# Patient Record
Sex: Female | Born: 1985 | Hispanic: Yes | Marital: Single | Smoking: Never smoker
Health system: Southern US, Community
[De-identification: ages and names within clinical notes are randomized; demographics above are authoritative.]

## PROBLEM LIST (undated history)

## (undated) ENCOUNTER — Inpatient Hospital Stay (HOSPITAL_COMMUNITY): Payer: Self-pay

## (undated) DIAGNOSIS — D649 Anemia, unspecified: Secondary | ICD-10-CM

## (undated) DIAGNOSIS — O4690 Antepartum hemorrhage, unspecified, unspecified trimester: Secondary | ICD-10-CM

## (undated) HISTORY — PX: NO PAST SURGERIES: SHX2092

---

## 2012-06-01 NOTE — L&D Delivery Note (Signed)
Delivery Note At 1:58 PM a viable female was delivered via Vaginal, Spontaneous Delivery (Presentation: Left Occiput Anterior).  APGAR: crying vigorously on perineum, Pending final APGARS Placenta status: Intact, Spontaneous.  Cord: Unknown with the following complications: None.    Anesthesia: None  Episiotomy: None Lacerations: None Suture Repair: NA Est. Blood Loss (mL): 250  Mom to postpartum.  Baby to nursery-stable.  Jolyn Lent RYAN 02/02/2013, 2:08 PM

## 2012-06-23 LAB — OB RESULTS CONSOLE GC/CHLAMYDIA: Chlamydia: NEGATIVE

## 2012-06-23 LAB — OB RESULTS CONSOLE ABO/RH: RH Type: POSITIVE

## 2012-06-23 LAB — OB RESULTS CONSOLE HIV ANTIBODY (ROUTINE TESTING): HIV: NONREACTIVE

## 2012-12-29 ENCOUNTER — Encounter (HOSPITAL_COMMUNITY): Payer: Self-pay

## 2012-12-29 ENCOUNTER — Inpatient Hospital Stay (HOSPITAL_COMMUNITY)
Admission: AD | Admit: 2012-12-29 | Discharge: 2012-12-29 | Disposition: A | Discharge status: Home / Self Care | Payer: Medicaid - Out of State | Source: Ambulatory Visit | Attending: Obstetrics & Gynecology | Admitting: Obstetrics & Gynecology

## 2012-12-29 DIAGNOSIS — O4700 False labor before 37 completed weeks of gestation, unspecified trimester: Secondary | ICD-10-CM | POA: Insufficient documentation

## 2012-12-29 DIAGNOSIS — O2390 Unspecified genitourinary tract infection in pregnancy, unspecified trimester: Secondary | ICD-10-CM

## 2012-12-29 DIAGNOSIS — B3731 Acute candidiasis of vulva and vagina: Secondary | ICD-10-CM | POA: Insufficient documentation

## 2012-12-29 DIAGNOSIS — N949 Unspecified condition associated with female genital organs and menstrual cycle: Secondary | ICD-10-CM | POA: Insufficient documentation

## 2012-12-29 DIAGNOSIS — O0933 Supervision of pregnancy with insufficient antenatal care, third trimester: Secondary | ICD-10-CM

## 2012-12-29 DIAGNOSIS — B373 Candidiasis of vulva and vagina: Secondary | ICD-10-CM | POA: Insufficient documentation

## 2012-12-29 LAB — URINALYSIS, ROUTINE W REFLEX MICROSCOPIC
Bilirubin Urine: NEGATIVE
Glucose, UA: NEGATIVE mg/dL
Hgb urine dipstick: NEGATIVE
Ketones, ur: NEGATIVE mg/dL
Protein, ur: NEGATIVE mg/dL
pH: 7 (ref 5.0–8.0)

## 2012-12-29 LAB — WET PREP, GENITAL
Clue Cells Wet Prep HPF POC: NONE SEEN
Trich, Wet Prep: NONE SEEN

## 2012-12-29 LAB — URINE MICROSCOPIC-ADD ON

## 2012-12-29 MED ORDER — FLUCONAZOLE 150 MG PO TABS
150.0000 mg | ORAL_TABLET | Freq: Once | ORAL | Status: AC
  Administered 2012-12-29: 150 mg via ORAL
  Filled 2012-12-29: qty 1

## 2012-12-29 NOTE — MAU Note (Signed)
Patient is visiting from [place] and gets prenatal care there. States she has been having contractions and a yellow vaginal discharge with vaginal discomfort for 3 days. Denies bleeding and reports good fetal movement.

## 2012-12-29 NOTE — MAU Provider Note (Signed)
History     CSN: [id number]  Arrival date and time: 12/29/12 1312   None     Chief Complaint  Patient presents with  . Labor Eval  . Vaginal Discharge   HPI 27 y.o. [phone] at [redacted]w[redacted]d presents for contractions. Contractions started 3 days ago, have increased in frequency and says since last night they have been every 3 minutes. Denies gush of fluid, bleeding, does have yellow vaginal discharge and hurts when she pees. +FM in last hour. Denies headache, dizziness, changes in vision, chest pain, shortness of breath  Prenatal course: Care in [place], phone # [phone] Wants to establish care here in [place] Last prenatal visit June 30th Says there have been no problems so far  OB History   Grav Para Term Preterm Abortions TAB SAB Ect Mult Living   4 3 3       3     Per patient, 2 pregnancies were preterm, born at 77 months  Past Medical History  Diagnosis Date  . Medical history non-contributory     Past Surgical History  Procedure Laterality Date  . No past surgeries      Family History  Problem Relation Age of Onset  . Alcohol abuse Neg Hx   . Asthma Neg Hx   . Arthritis Neg Hx   . Birth defects Neg Hx   . Cancer Neg Hx   . COPD Neg Hx   . Depression Neg Hx   . Diabetes Neg Hx   . Drug abuse Neg Hx   . Early death Neg Hx   . Hearing loss Neg Hx   . Heart disease Neg Hx   . Hyperlipidemia Neg Hx   . Hypertension Neg Hx   . Kidney disease Neg Hx   . Learning disabilities Neg Hx   . Mental illness Neg Hx   . Mental retardation Neg Hx   . Miscarriages / Stillbirths Neg Hx   . Stroke Neg Hx   . Vision loss Neg Hx     History  Substance Use Topics  . Smoking status: Never Smoker   . Smokeless tobacco: Not on file  . Alcohol Use: No    Allergies: No Known Allergies  Prescriptions prior to admission  Medication Sig Dispense Refill  . Prenatal Vit-Fe Fumarate-FA (PRENATAL MULTIVITAMIN) TABS Take 1 tablet by mouth daily.      Marland Kitchen PRESCRIPTION  MEDICATION Take 1 capsule by mouth daily. Pt reports taking a prescription Iron supplement; unknown name/strength        ROS negative except as above Physical Exam   Blood pressure 113/68, pulse 87, temperature 98.3 F (36.8 C), temperature source Oral, resp. rate 16, height 4\' 10"  (1.473 m), weight 82.192 kg (181 lb 3.2 oz).  Physical Exam General appearance: alert, cooperative and no distress Head: Normocephalic, without obvious abnormality, atraumatic Lungs: clear to auscultation bilaterally Heart: regular rate and rhythm, S1, S2 normal, no murmur, click, rub or gallop Abdomen: soft, gravid, nontender to palpation Extremities: no edema, redness or tenderness in the calves or thighs Pulses: 2+ and symmetric DP and PT Skin: warm and dry  SSE: NAVM, Thin yellowish white mucous. Cervix appears closed with nothing draining  Dilation: Fingertip Effacement (%): Thick Station: Ballotable Exam by:: DR Danie Binder and cervical exam performed with nurse present  FHT: 135bpm, mod var, 15x15 accels present, no decels Toco: rare contractions, q53min  MAU Course  Procedures Results for orders placed during the hospital encounter of 12/29/12 (from the past  24 hour(s))  URINALYSIS, ROUTINE W REFLEX MICROSCOPIC     Status: Abnormal   Collection Time    12/29/12  1:45 PM      Result Value Range   Color, Urine YELLOW  YELLOW   APPearance HAZY (*) CLEAR   Specific Gravity, Urine 1.020  1.005 - 1.030   pH 7.0  5.0 - 8.0   Glucose, UA NEGATIVE  NEGATIVE mg/dL   Hgb urine dipstick NEGATIVE  NEGATIVE   Bilirubin Urine NEGATIVE  NEGATIVE   Ketones, ur NEGATIVE  NEGATIVE mg/dL   Protein, ur NEGATIVE  NEGATIVE mg/dL   Urobilinogen, UA 1.0  0.0 - 1.0 mg/dL   Nitrite NEGATIVE  NEGATIVE   Leukocytes, UA LARGE (*) NEGATIVE  URINE MICROSCOPIC-ADD ON     Status: Abnormal   Collection Time    12/29/12  1:45 PM      Result Value Range   Squamous Epithelial / LPF FEW (*) RARE   WBC, UA 3-6  <3  WBC/hpf   Bacteria, UA FEW (*) RARE  FETAL FIBRONECTIN     Status: None   Collection Time    12/29/12  4:00 PM      Result Value Range   Fetal Fibronectin NEGATIVE  NEGATIVE  WET PREP, GENITAL     Status: Abnormal   Collection Time    12/29/12  4:00 PM      Result Value Range   Yeast Wet Prep HPF POC MODERATE (*) NONE SEEN   Trich, Wet Prep NONE SEEN  NONE SEEN   Clue Cells Wet Prep HPF POC NONE SEEN  NONE SEEN   WBC, Wet Prep HPF POC MANY (*) NONE SEEN    MDM   Assessment and Plan  27 y.o. [id number] at [redacted]w[redacted]d   FFN negative, rare contractions, cervix fingertip FHT reassuring Yeast on wet prep, will give 150mg  diflucan in MAU Message sent to [facility] to establish care in [place] ROI form signed by patient, to be faxed tomorrow   Tawni Carnes 12/29/2012, 3:23 PM  Evaluation and management procedures were performed by Resident physician under my supervision/collaboration. Chart reviewed, patient examined by me and I agree with management and plan.

## 2012-12-30 LAB — URINE CULTURE: Colony Count: 70000

## 2012-12-30 LAB — GC/CHLAMYDIA PROBE AMP: CT Probe RNA: NEGATIVE

## 2013-01-01 NOTE — MAU Provider Note (Signed)
Attestation of Attending Supervision of Advanced Practitioner (PA/CNM/NP): Evaluation and management procedures were performed by the Advanced Practitioner under my supervision and collaboration.  I have reviewed the Advanced Practitioner's note and chart, and I agree with the management and plan.  Marquice Uddin, MD, FACOG Attending Obstetrician & Gynecologist Faculty Practice, [facility]  

## 2013-01-05 ENCOUNTER — Ambulatory Visit (INDEPENDENT_AMBULATORY_CARE_PROVIDER_SITE_OTHER): Payer: Self-pay | Admitting: Family Medicine

## 2013-01-05 ENCOUNTER — Encounter: Payer: Self-pay | Admitting: Family Medicine

## 2013-01-05 VITALS — BP 113/69 | Temp 97.2°F | Wt 181.5 lb

## 2013-01-05 DIAGNOSIS — Z23 Encounter for immunization: Secondary | ICD-10-CM

## 2013-01-05 DIAGNOSIS — O09219 Supervision of pregnancy with history of pre-term labor, unspecified trimester: Secondary | ICD-10-CM

## 2013-01-05 DIAGNOSIS — Z3480 Encounter for supervision of other normal pregnancy, unspecified trimester: Secondary | ICD-10-CM | POA: Insufficient documentation

## 2013-01-05 DIAGNOSIS — Z3483 Encounter for supervision of other normal pregnancy, third trimester: Secondary | ICD-10-CM

## 2013-01-05 LAB — CBC
HCT: 34.8 % — ABNORMAL LOW (ref 36.0–46.0)
MCHC: 33.3 g/dL (ref 30.0–36.0)
MCV: 82.7 fL (ref 78.0–100.0)
Platelets: 202 10*3/uL (ref 150–400)
RDW: 14.3 % (ref 11.5–15.5)

## 2013-01-05 LAB — POCT URINALYSIS DIP (DEVICE)
Glucose, UA: NEGATIVE mg/dL
Hgb urine dipstick: NEGATIVE
Nitrite: NEGATIVE
Urobilinogen, UA: 1 mg/dL (ref 0.0–1.0)
pH: 6.5 (ref 5.0–8.0)

## 2013-01-05 MED ORDER — TETANUS-DIPHTH-ACELL PERTUSSIS 5-2.5-18.5 LF-MCG/0.5 IM SUSP
0.5000 mL | Freq: Once | INTRAMUSCULAR | Status: AC
  Administered 2013-01-05: 0.5 mL via INTRAMUSCULAR

## 2013-01-05 NOTE — Progress Notes (Signed)
No PTL symptoms except  3 contractions/day--PTL precations.  New OB transfer from [place].  Last visit there 6/30.  28 wk labs and TDaP today.  Will get records.

## 2013-01-05 NOTE — Progress Notes (Signed)
Pulse- 81  Edema-feet  Pain- lower abd New ob packet given Weight gain of 11-20lb Tdap vaccine consented and info given

## 2013-01-05 NOTE — Patient Instructions (Signed)
Embarazo  Tercer trimestre  (Pregnancy - Third Trimester) El tercer trimestre del embarazo (los ltimos 3 meses) es el perodo en el cual tanto usted como su beb crecen con ms rapidez. El beb alcanza un largo de aproximadamente 50 cm. y pesa entre 2,700 y 4,500 kg. El beb gana ms tejido graso y est listo para la vida fuera del cuerpo de la madre. Mientras estn en el interior, los bebs tienen perodos de sueo y vigilia, succionan el pulgar y tienen hipo. Quizs sienta pequeas contracciones del tero. Este es el falso trabajo de parto. Tambin se las conoce como contracciones de Braxton-Hicks . Es como una prctica del parto. Los problemas ms habituales de esta etapa del embarazo incluyen mayor dificultad para respirar, hinchazn de las manos y los pies por retencin de lquidos y la necesidad de orinar con ms frecuencia debido a que el tero y el beb presionan sobre la vejiga.  EXAMENES PRENATALES   Durante los exmenes prenatales, deber seguir realizndose anlisis de sangre. Estas pruebas se realizan para controlar su salud y la del beb. Los anlisis de sangre se realizan para conocer los niveles de algunos compuestos de la sangre (hemoglobina). La anemia (bajo nivel de hemoglobina) es frecuente durante el embarazo. Para prevenirla, se administran hierro y vitaminas. Tambin le tomarn nuevas anlisis para descartar diabetes. Podrn repetirle algunas de las pruebas que le hicieron previamente.  En cada visita le medirn el tamao del tero. Esto permite asegurar que el beb se desarrolla adecuadamente, segn la fecha del embarazo.  Le controlarn la presin arterial en cada visita prenatal. Esto es para asegurarse de que no sufre toxemia.  Le harn un anlisis de orina en cada visita prenatal, para descartar infecciones, diabetes y la presencia de protenas.  Tambin en cada visita controlarn su peso. Esto se realiza para asegurarse que aumenta de peso al ritmo indicado y que usted y  su beb evolucionan normalmente.  En algunas ocasiones se realiza una prueba de ultrasonido para confirmar el correcto desarrollo y evolucin del beb. Esta prueba se realiza con ondas sonoras inofensivas para el beb, de modo que el profesional pueda calcular ms precisamente la fecha del parto.  Analice con su mdico los analgsicos y la anestesia que recibir durante el trabajo de parto y el parto.  Comente la posibilidad de que necesite una cesrea y qu anestesia se recibir.  Informe a su mdico si sufre violencia familiar mental o fsica. A veces, se indica la prueba especializada sin estrs, la prueba de tolerancia a las contracciones y el perfil biofsico para asegurarse de que el beb no tiene problemas. El estudio del lquido amnitico que rodea al beb se llama amniocentesis. El lquido amnitico se obtiene introduciendo una aguja en el vientre (abdomen ). En ocasiones se lleva a cabo cerca del final del embarazo, si es necesario inducir a un parto. En este caso se realiza para asegurarse que los pulmones del beb estn lo suficientemente maduros como para que pueda vivir fuera del tero. Si los pulmones no han madurado y es peligroso que el beb nazca, se administrar a la madre una inyeccin de cortisona , 1 a 2 das antes del parto. . Esto ayuda a que los pulmones del beb maduren y sea ms seguro su nacimiento.  CAMBIOS QUE OCURREN EN EL TERCER TRIMESTRE DEL EMBARAZO  Su organismo atravesar numerosos cambios durante el embarazo. Estos pueden variar de una persona a otra. Converse con el profesional que la asiste acerca los cambios que   usted note y que la preocupen.   Durante el ltimo trimestre probablemente sienta un aumento del apetito. Es normal tener "antojos" de ciertas comidas. Esto vara de una persona a otra y de un embarazo a otro.  Podrn aparecer las primeras estras en las caderas, abdomen y mamas. Estos son cambios normales del cuerpo durante el embarazo. No existen  medicamentos ni ejercicios que puedan prevenir estos cambios.  La constipacin puede tratarse con un laxante o agregando fibra a su dieta. Beber grandes cantidades de lquidos, tomar fibras en forma de vegetales, frutas y granos integrales es de gran ayuda.  Tambin es beneficioso practicar actividad fsica. Si ha sido una persona activa hasta el embarazo, podr continuar con la mayora de las actividades durante el mismo. Si ha sido menos activa, puede ser beneficioso que comience con un programa de ejercicios, como realizar caminatas. Consulte con el profesional que la asiste antes de comenzar un programa de ejercicios.  Evite el consumo de cigarrillos, el alcohol, los medicamentos no recetados y las "drogas de la calle" durante el embarazo. Estas sustancias qumicas afectan la formacin y el desarrollo del beb. Evite estas sustancias durante todo el embarazo para asegurar el nacimiento de un beb sano.  Podr sentir dolor de espalda, tener vrices en las venas y hemorroides, o si ya los sufra, pueden empeorar.  Durante el tercer trimestre se cansar con ms facilidad, lo cual es normal.  Los movimientos del beb pueden ser ms fuertes y con ms frecuencia.  Puede que note dificultades para respirar normalmente.  El ombligo puede salir hacia afuera.  A veces sale una secrecin amarilla de las mamas, que se llama calostro.  Podr aparecer una secrecin mucosa con sangre. Esto suele ocurrir entre unos pocos das y una semana antes del parto. INSTRUCCIONES PARA EL CUIDADO EN EL HOGAR   Cumpla con las citas de control. Siga las indicaciones del mdico con respecto al uso de medicamentos, los ejercicios y la dieta.  Durante el embarazo debe obtener nutrientes para usted y para su beb. Consuma alimentos balanceados a intervalos regulares. Elija alimentos como carne, pescado, leche y otros productos lcteos descremados, vegetales, frutas, panes integrales y cereales. El mdico le informar  cul es el aumento de peso ideal.  Las relaciones sexuales pueden continuarse hasta casi el final del embarazo, si no se presentan otros problemas como prdida prematura (antes de tiempo) de lquido amnitico, hemorragia vaginal o dolor en el vientre (abdominal).  Realice actividad fsica todos los das, si no tiene restricciones. Consulte con el profesional que la asiste si no sabe con certeza si determinados ejercicios son seguros. El mayor aumento de peso se producir en los ltimos 2 trimestres del embarazo. El ejercicio ayuda a:  Controlar su peso.  Mantenerse en forma para el trabajo de parto y el parto .  Perder peso despus del parto.  Haga reposo con frecuencia, con las piernas elevadas, o segn lo necesite para evitar los calambres y el dolor de cintura.  Use un buen sostn o como los que se usan para hacer deportes para aliviar la sensibilidad de las mamas. Tambin puede serle til si lo usa mientras duerme. Si pierde calostro, podr utilizar apsitos en el sostn.  No utilice la baera con agua caliente, baos turcos y saunas.   Colquese el cinturn de seguridad cuando conduzca. Este la proteger a usted y al beb en caso de accidente.  Evite comer carne cruda y el contacto con los utensilios y desperdicios de los gatos. Estos   elementos contienen grmenes que pueden causar defectos de nacimiento en el beb.  Es fcil perder algo de orina durante el embarazo. Apretar y fortalecer los msculos de la pelvis la ayudar con este problema. Practique detener la miccin cuando est en el bao. Estos son los mismos msculos que necesita fortalecer. Son tambin los mismos msculos que utiliza cuando trata de evitar despedir gases. Puede practicar apretando estos msculos diez veces, y repetir esto tres veces por da aproximadamente. Una vez que conozca qu msculos debe apretar, no realice estos ejercicios durante la miccin. Puede favorecerle una infeccin si la orina vuelve hacia  atrs.  Pida ayuda si tienen necesidades financieras, teraputicas o nutricionales. El profesional podr ayudarla con respecto a estas necesidades, o derivarla a otros especialistas.  Haga una lista de nmeros telefnicos de emergencia y tngalos disponibles.  Planifique como obtener ayuda de familiares o amigos cuando regrese a casa desde el hospital.  Hacer un ensayo sobre la partida al hospital.  Tome clases prenatales con el padre para entender, practicar y hacer preguntas sobre el trabajo de parto y el alumbramiento.  Preparar la habitacin del beb / busque una guardera.  No viaje fuera de la ciudad a menos que sea absolutamente necesario y con el asesoramiento de su mdico.  Use slo zapatos de tacn bajo o sin tacn para tener mejor equilibrio y evitar cadas. USO DE MEDICAMENTOS Y CONSUMO DE DROGAS DURANTE EL EMBARAZO   Tome las vitaminas apropiadas para esta etapa tal como se le indic. Las vitaminas deben contener un miligramo de cido flico. Guarde todas las vitaminas fuera del alcance de los nios. La ingestin de slo un par de vitaminas o tabletas que contengan hierro pueden ocasionar la muerte en un beb o en un nio pequeo.  Evite el uso de todos los medicamentos, incluyendo hierbas, medicamentos de venta libre, sin receta o que no hayan sido sugeridos por su mdico. Slo tome medicamentos de venta libre o medicamentos recetados para el dolor, el malestar o fiebre como lo indique su mdico. No tome aspirina, ibuprofeno o naproxeno excepto que su mdico se lo indique.  Infrmele al profesional si consume alguna droga.  El alcohol se relaciona con ciertos defectos congnitos. Incluye el sndrome de alcoholismo fetal. Debe evitar absolutamente el consumo de alcohol, en cualquier forma. El fumar produce baja tasa de natalidad y bebs prematuros.  Las drogas ilegales o de la calle son muy perjudiciales para el beb. Estn absolutamente prohibidas. Un beb que nace de una  madre adicta, ser adicto al nacer. Ese beb tendr los mismos sntomas de abstinencia que un adulto. SOLICITE ATENCIN MDICA SI:  Tiene preguntas o preocupaciones relacionadas con el embarazo. Es mejor que llame para formular las preguntas si no puede esperar hasta la prxima visita, que sentirse preocupada por ellas.  SOLICITE ATENCIN MDICA DE INMEDIATO SI:   La temperatura oral le sube a ms de 38,9 C (102 F) o lo que su mdico le indique.  Tiene una prdida de lquido por la vagina (canal de parto). Si sospecha una ruptura de las membranas, tmese la temperatura y llame al profesional para informarlo sobre esto.  Observa unas pequeas manchas, una hemorragia vaginal o elimina cogulos. Notifique al profesional acerca de la cantidad y de cuntos apsitos est utilizando.  Presenta un olor desagradable en la secrecin vaginal y observa un cambio en el color, de transparente a blanco.  Ha vomitado durante ms de 24 horas.  Siente escalofros o le sube la fiebre.  Le   falta el Jazzlene.  Siente ardor al orinar.  Baja o sube ms de 2 libras (900 g), o segn lo indicado por el profesional que la asiste.  Observa que sbitamente se le hinchan el rostro, las manos, los pies o las piernas.  Siente dolor en el vientre (abdominal). Las molestias en el ligamento redondo son una causa benigna frecuente de dolor abdominal durante el embarazo. El profesional que la asiste deber evaluarla.  Presenta dolor de cabeza intenso que no se alivia.  Tiene problemas visuales, visin doble o borrosa.  Si no siente los movimientos del beb durante ms de 1 hora. Si piensa que el beb no se mueve tanto como lo haca habitualmente, coma algo que contenga azcar y recustese sobre el lado izquierdo durante una hora. El beb debe moverse al menos 4  5 veces por hora. Comunquese inmediatamente si el beb se mueve menos que lo indicado.  Se cae, se ve involucrada en un accidente automovilstico o sufre algn  tipo de traumatismo.  En su hogar hay violencia mental o fsica. Document Released: 02/25/2005 Document Revised: 02/10/2012 [employer] Patient Information 2014 [employer].  Lactancia materna  (Breastfeeding)  El cambio hormonal durante el embarazo produce el desarrollo del tejido mamario y un aumento en el nmero y tamao de los conductos galactforos. La hormona prolactina permite que las protenas, los azcares y las grasas de la sangre produzcan la leche materna en las glndulas productoras de leche. La hormona progesterona impide que la leche materna sea liberada antes del nacimiento del beb. Despus del nacimiento del beb, su nivel de progesterona disminuye permitiendo que la leche materna sea liberada. Pensar en el beb, as como la succin o el llanto, pueden estimular la liberacin de leche de las glndulas productoras de leche.  La decisin de amamantar (lactar) es una de las mejores opciones que usted puede hacer para usted y su beb. La informacin que sigue da una breve resea de los beneficios, as como otras caractersticas importantes que debe saber sobre la lactancia materna.  LOS BENEFICIOS DE AMAMANTAR  Para el beb   La primera leche (calostro) ayuda al mejor funcionamiento del sistema digestivo del beb.   La leche tiene anticuerpos que provienen de la madre y que ayudan a prevenir las infecciones en el beb.   El beb tiene una menor incidencia de asma, alergias y del sndrome de muerte sbita del lactante (SMSL).   Los nutrientes de la leche materna son mejores para el beb que la leche maternizada.  La leche materna mejora el desarrollo cerebral del beb.   Su beb tendr menos gases, clicos y estreimiento.  Es menos probable que el beb desarrolle otras enfermedades, como obesidad infantil, asma o diabetes mellitus. Para usted   La lactancia materna favorece el desarrollo de un vnculo muy especial entre la madre y el beb.   Es ms conveniente,  siempre disponible, a la temperatura adecuada y econmica.   La lactancia materna ayuda a quemar caloras y a perder el peso ganado durante el embarazo.   Hace que el tero se contraiga ms rpidamente a su tamao normal y disminuye el sangrado despus del parto.   Las madres que amamantan tienen menos riesgo de desarrollar osteoporosis o cncer de mama o de ovario en el futuro.  FRECUENCIA DEL AMAMANTAMIENTO   Un beb sano, nacido a trmino, puede amamantarse con tanta frecuencia como cada hora, o espaciar las comidas cada tres horas. La frecuencia en la lactancia varan de un beb a   otro.   Los recin nacidos deben ser alimentados por lo menos cada 2-3 horas durante el da y cada 4-5 horas durante la noche. Usted debe amamantarlo un mnimo de 8 tomas en un perodo de 24 horas.  Despierte al beb para amamantarlo si han pasado 3-4 horas desde la ltima comida.  Amamante cuando sienta la necesidad de reducir la plenitud de sus senos o cuando el beb muestre signos de hambre. Las seales de que el beb puede tener hambre son:  Aumenta su estado de alerta o vigilancia.  Se estira.  Mueve la cabeza de un lado a otro.  Mueve la cabeza y abre la boca cuando se le toca la mejilla o la boca (reflejo de succin).  Aumenta las vocalizaciones, tales como sonidos de succin, relamerse los labios, arrullos, suspiros, o chirridos.  Mueve la mano hacia la boca.  Se chupa con ganas los dedos o las manos.  Agitacin.  Llanto intermitente.  Los signos de hambre extrema requerirn que lo calme y lo consuele antes de tratar de alimentarlo. Los signos de hambre extrema son:  Agitacin.  Llanto fuerte e intenso.  Gritos.  El amamantamiento frecuente la ayudar a producir ms leche y a prevenir problemas de dolor en los pezones e hinchazn de las mamas.  LACTANCIA MATERNA   Ya sea que se encuentre acostada o sentada, asegrese que el abdomen del beb est enfrente el suyo.   Sostenga  la mama con el pulgar por arriba y los otros 4 dedos por debajo del pezn. Asegrese que sus dedos se encuentren lejos del pezn y de la boca del beb.   Empuje suavemente los labios del beb con el pezn o con el dedo.   Cuando la boca del beb se abra lo suficiente, introduzca el pezn y la zona oscura que lo rodea (areola) tanto como le sea posible dentro de la boca.  Debe haber ms areola visible por arriba del labio superior que por debajo del labio inferior.  La lengua del beb debe estar entre la enca inferior y el seno.  Asegrese de que la boca del beb est en la posicin correcta alrededor del pezn (prendida). Los labios del beb deben crear un sello sobre su pecho.  Las seales de que el beb se ha prendido eficazmente al pezn son:  Tironea o succiona sin dolor.  Se escucha que traga entre las succiones.  No hace ruidos ni chasquidos.  Hay movimientos musculares por arriba y por delante de sus odos al succionar.  El beb debe succionar unos 2-3 minutos para que salga la leche. Permita que el nio se alimente en cada mama todo lo que desee. Alimente al beb hasta que se desprenda o se quede dormido en el primer pecho y luego ofrzcale el segundo pecho.  Las seales de que el beb est lleno y satisfecho son:  Disminuye gradualmente el nmero de succiones o no succiona.  Se queda dormido.  Extiende o relaja su cuerpo.  Retiene una pequea cantidad de leche en la boca.  Se desprende del pecho por s mismo.  Los signos de una lactancia materna eficaz son:  Los senos han aumentado la firmeza, el peso y el tamao antes de la alimentacin.  Son ms blandos despus de amamantar.  Un aumento del volumen de leche, y tambin el cambio de su consistencia y color se producen hacia el quinto da de lactancia materna.  La congestin mamaria se alivia al dar de mamar.  Los pezones no duelen,   ni estn agrietados ni sangran.  De ser necesario, interrumpa la succin  poniendo su dedo en la esquina de la boca del beb y deslizando el dedo entre sus encas. A continuacin, retire la mama de su boca.  Es comn que los bebs regurgiten un poco despus de comer.  A menudo los bebs tragan Anndee al alimentarse. Esto puede hacer que se sienta molesto. Hacer eructar al beb al cambiar de pecho puede ser de ayuda.  Se recomiendan suplementos de vitamina D para los bebs que reciben slo leche materna.  Evite el uso del chupete durante las primeras 4 a 6 semanas de vida.  Evite la alimentacin suplementaria con agua, frmula o jugo en lugar de la leche materna. La leche materna es todo el alimento que el beb necesita. No es necesario que el nio ingiera agua o preparados de bibern. Sus pechos producirn ms leche si se evita la alimentacin suplementaria durante las primeras semanas. COMO SABER SI EL BEB OBTIENE LA SUFICIENTE LECHE MATERNA  Preguntarse si el beb obtiene la cantidad suficiente de leche es una preocupacin frecuente entre las madres. Puede asegurarse que el beb tiene la leche suficiente si:   El beb succiona activamente y usted escucha que traga.   El beb parece estar relajado y satisfecho despus de mamar.   El nio se alimenta al menos 8 a 12 veces en 24 horas.  Durante los primeros 3 a 5 das de vida:  Moja 3-5 paales en 24 horas. La materia fecal debe ser blanda y amarillenta.  Tiene al menos 3 a 4 deposiciones en 24 horas. La materia fecal debe ser blanda y amarillenta.  A los 5-7 das de vida, el beb debe tener al menos 3-6 deposiciones en 24 horas. La materia fecal debe ser grumosa y amarilla a los 5 das de vida.  Su beb tiene una prdida de peso menor a 7al 10% durante los primeros 3 das de vida.  El beb no pierde peso despus de 3-7 das de vida.  El beb debe aumentar 4 a 6 libras (120 a 170 gr.) por semana despus de los 4 das de vida.  Aumenta de peso a los 5 das de vida y vuelve al peso del nacimiento dentro de  las 2 semanas. CONGESTIN MAMARIA  Durante la primera semana despus del parto, usted puede experimentar hinchazn en las mamas (congestin mamaria). Al estar congestionadas, las mamas se sienten pesadas, calientes o sensibles al tacto. El pico de la congestin ocurre a las 24 -48 horas despus del parto.   La congestin puede disminuirse:  Continuando con la lactancia materna.  Aumentando la frecuencia.  Tomando duchas calientes o aplicando calor hmedo en los senos antes de cada comida. Esto aumenta la circulacin y ayuda a que la leche fluya.   Masajeando suavemente el pecho antes y durante las comidas. Con las yemas de los dedos, masajee la pared del pecho hacia el pezn en un movimiento circular.   Asegurarse de que el beb vaca al menos uno de sus pechos en cada alimentacin. Tambin ayuda si comienza la siguiente toma en el otro seno.   Extraiga manualmente o con un sacaleches las mamas para vaciar los pechos si el beb tiene sueo o no se aliment bien. Tambin puede extraer la leche cuando vuelva a trabajar o si siente que se estn congestionando las mamas.  Asegrese de que el beb se prende y est bien colocado durante la lactancia. Si sigue estas indicaciones, la congestin debe mejorar   en 24 a 48 horas. Si an tiene dificultades, consulte a su asesor en lactancia.  CUDESE USTED MISMA  Cuide sus mamas.   Bese o dchese diariamente.   Evite usar jabn en los pezones.   Use un sostn de soporte Evite el uso de sostenes con aro.  Seque al Shari sus pezones durante 3-4 minutos despus de cada comida.   Utilice slo apsitos de algodn en el sostn para absorber las prdidas de leche. La prdida de un poco de leche materna entre las comidas es normal.   Use solamente lanolina pura en sus pezones despus de amamantar. Usted no tiene que lavarla antes de alimentar al beb. Otra opcin es sacarse unas gotas de leche y masajear suavemente los pezones.  Continuar con  los autocontroles de la mama. Cudese.   Consuma alimentos saludables. Alterne 3 comidas con 3 colaciones.  Evite los alimentos que usted nota que perjudican al beb.  Beba leche, jugos de fruta y agua para satisfacer su sed (aproximadamente 8 vasos al da).   Descanse con frecuencia, reljese y tome sus vitaminas prenatales para evitar la fatiga, el estrs y la anemia.  Evite masticar y fumar tabaco.  Evite el consumo de alcohol y drogas.  Tome medicamentos de venta libre y recetados tal como le indic su mdico o farmacutico. Siempre debe consultar con su mdico o farmacutico antes de tomar cualquier medicamento, vitamina o suplemento de hierbas.  Sepa que durante la lactancia puede quedar embarazada. Si lo desea, hable con su mdico acerca de la planificacin familiar y los mtodos anticonceptivos seguros que puede utilizar durante la lactancia. SOLICITE ATENCIN MDICA SI:   Usted siente que quiere dejar de amamantar o se siente frustrada con la lactancia.  Siente dolor en los senos o en los pezones.  Sus pezones estn agrietados o sangran.  Sus pechos estn irritados, sensibles o calientes.  Tiene un rea hinchada en cualquiera de los senos.  Siente escalofros o fiebre.  Tiene nuseas o vmitos.  Observa un drenaje en los pezones.  Sus mamas no se llenan antes de amamantarlo al 5to da despus del parto.  Se siente triste y deprimida.  El nio est demasiado somnoliento como para comer.  El nio tiene problemas para respirar.   Moja menos de 3 paales en 24 horas.  Mueve el intestino menos de 3 veces en 24 horas.  La piel del beb o la parte blanca de sus ojos est ms amarilla.   El beb no ha aumentado de peso a los 5 das de vida. ASEGRESE DE QUE:   Comprende estas instrucciones.  Controlar su enfermedad.  Solicitar ayuda de inmediato si no mejora o si empeora. Document Released: 05/18/2005 Document Revised: 02/10/2012 [employer] Patient  Information 2014 [employer].  

## 2013-01-06 ENCOUNTER — Encounter: Payer: Self-pay | Admitting: Family Medicine

## 2013-01-06 LAB — RPR

## 2013-01-09 ENCOUNTER — Encounter: Payer: Self-pay | Admitting: *Deleted

## 2013-01-19 ENCOUNTER — Ambulatory Visit (INDEPENDENT_AMBULATORY_CARE_PROVIDER_SITE_OTHER): Payer: Medicaid - Out of State | Admitting: Obstetrics and Gynecology

## 2013-01-19 ENCOUNTER — Encounter: Payer: Self-pay | Admitting: Obstetrics and Gynecology

## 2013-01-19 VITALS — BP 110/64 | Temp 97.1°F | Wt 182.6 lb

## 2013-01-19 DIAGNOSIS — O09213 Supervision of pregnancy with history of pre-term labor, third trimester: Secondary | ICD-10-CM | POA: Insufficient documentation

## 2013-01-19 DIAGNOSIS — Z3689 Encounter for other specified antenatal screening: Secondary | ICD-10-CM

## 2013-01-19 DIAGNOSIS — E669 Obesity, unspecified: Secondary | ICD-10-CM | POA: Insufficient documentation

## 2013-01-19 DIAGNOSIS — O09219 Supervision of pregnancy with history of pre-term labor, unspecified trimester: Secondary | ICD-10-CM

## 2013-01-19 DIAGNOSIS — Z3483 Encounter for supervision of other normal pregnancy, third trimester: Secondary | ICD-10-CM

## 2013-01-19 LAB — POCT URINALYSIS DIP (DEVICE)
Nitrite: NEGATIVE
Protein, ur: NEGATIVE mg/dL
Urobilinogen, UA: 0.2 mg/dL (ref 0.0–1.0)
pH: 7 (ref 5.0–8.0)

## 2013-01-19 MED ORDER — PRENATAL MULTIVITAMIN CH: 1.0000 | ORAL_TABLET | Freq: Every day | ORAL | Status: DC

## 2013-01-19 NOTE — Patient Instructions (Signed)
Embarazo  Tercer trimestre  (Pregnancy - Third Trimester) El tercer trimestre del embarazo (los ltimos 3 meses) es el perodo en el cual tanto usted como su beb crecen con ms rapidez. El beb alcanza un largo de aproximadamente 50 cm. y pesa entre 2,700 y 4,500 kg. El beb gana ms tejido graso y est listo para la vida fuera del cuerpo de la madre. Mientras estn en el interior, los bebs tienen perodos de sueo y vigilia, succionan el pulgar y tienen hipo. Quizs sienta pequeas contracciones del tero. Este es el falso trabajo de parto. Tambin se las conoce como contracciones de Braxton-Hicks . Es como una prctica del parto. Los problemas ms habituales de esta etapa del embarazo incluyen mayor dificultad para respirar, hinchazn de las manos y los pies por retencin de lquidos y la necesidad de orinar con ms frecuencia debido a que el tero y el beb presionan sobre la vejiga.  EXAMENES PRENATALES   Durante los exmenes prenatales, deber seguir realizndose anlisis de sangre. Estas pruebas se realizan para controlar su salud y la del beb. Los anlisis de sangre se realizan para conocer los niveles de algunos compuestos de la sangre (hemoglobina). La anemia (bajo nivel de hemoglobina) es frecuente durante el embarazo. Para prevenirla, se administran hierro y vitaminas. Tambin le tomarn nuevas anlisis para descartar diabetes. Podrn repetirle algunas de las pruebas que le hicieron previamente.  En cada visita le medirn el tamao del tero. Esto permite asegurar que el beb se desarrolla adecuadamente, segn la fecha del embarazo.  Le controlarn la presin arterial en cada visita prenatal. Esto es para asegurarse de que no sufre toxemia.  Le harn un anlisis de orina en cada visita prenatal, para descartar infecciones, diabetes y la presencia de protenas.  Tambin en cada visita controlarn su peso. Esto se realiza para asegurarse que aumenta de peso al ritmo indicado y que usted y su  beb evolucionan normalmente.  En algunas ocasiones se realiza una prueba de ultrasonido para confirmar el correcto desarrollo y evolucin del beb. Esta prueba se realiza con ondas sonoras inofensivas para el beb, de modo que el profesional pueda calcular ms precisamente la fecha del parto.  Analice con su mdico los analgsicos y la anestesia que recibir durante el trabajo de parto y el parto.  Comente la posibilidad de que necesite una cesrea y qu anestesia se recibir.  Informe a su mdico si sufre violencia familiar mental o fsica. A veces, se indica la prueba especializada sin estrs, la prueba de tolerancia a las contracciones y el perfil biofsico para asegurarse de que el beb no tiene problemas. El estudio del lquido amnitico que rodea al beb se llama amniocentesis. El lquido amnitico se obtiene introduciendo una aguja en el vientre (abdomen ). En ocasiones se lleva a cabo cerca del final del embarazo, si es necesario inducir a un parto. En este caso se realiza para asegurarse que los pulmones del beb estn lo suficientemente maduros como para que pueda vivir fuera del tero. Si los pulmones no han madurado y es peligroso que el beb nazca, se administrar a la madre una inyeccin de cortisona , 1 a 2 das antes del parto. . Esto ayuda a que los pulmones del beb maduren y sea ms seguro su nacimiento.  CAMBIOS QUE OCURREN EN EL TERCER TRIMESTRE DEL EMBARAZO  Su organismo atravesar numerosos cambios durante el embarazo. Estos pueden variar de una persona a otra. Converse con el profesional que la asiste acerca los cambios que   usted note y que la preocupen.   Durante el ltimo trimestre probablemente sienta un aumento del apetito. Es normal tener "antojos" de ciertas comidas. Esto vara de una persona a otra y de un embarazo a otro.  Podrn aparecer las primeras estras en las caderas, abdomen y mamas. Estos son cambios normales del cuerpo durante el embarazo. No existen  medicamentos ni ejercicios que puedan prevenir estos cambios.  La constipacin puede tratarse con un laxante o agregando fibra a su dieta. Beber grandes cantidades de lquidos, tomar fibras en forma de vegetales, frutas y granos integrales es de gran ayuda.  Tambin es beneficioso practicar actividad fsica. Si ha sido una persona activa hasta el embarazo, podr continuar con la mayora de las actividades durante el mismo. Si ha sido menos activa, puede ser beneficioso que comience con un programa de ejercicios, como realizar caminatas. Consulte con el profesional que la asiste antes de comenzar un programa de ejercicios.  Evite el consumo de cigarrillos, el alcohol, los medicamentos no recetados y las "drogas de la calle" durante el embarazo. Estas sustancias qumicas afectan la formacin y el desarrollo del beb. Evite estas sustancias durante todo el embarazo para asegurar el nacimiento de un beb sano.  Podr sentir dolor de espalda, tener vrices en las venas y hemorroides, o si ya los sufra, pueden empeorar.  Durante el tercer trimestre se cansar con ms facilidad, lo cual es normal.  Los movimientos del beb pueden ser ms fuertes y con ms frecuencia.  Puede que note dificultades para respirar normalmente.  El ombligo puede salir hacia afuera.  A veces sale una secrecin amarilla de las mamas, que se llama calostro.  Podr aparecer una secrecin mucosa con sangre. Esto suele ocurrir entre unos pocos das y una semana antes del parto. INSTRUCCIONES PARA EL CUIDADO EN EL HOGAR   Cumpla con las citas de control. Siga las indicaciones del mdico con respecto al uso de medicamentos, los ejercicios y la dieta.  Durante el embarazo debe obtener nutrientes para usted y para su beb. Consuma alimentos balanceados a intervalos regulares. Elija alimentos como carne, pescado, leche y otros productos lcteos descremados, vegetales, frutas, panes integrales y cereales. El mdico le informar  cul es el aumento de peso ideal.  Las relaciones sexuales pueden continuarse hasta casi el final del embarazo, si no se presentan otros problemas como prdida prematura (antes de tiempo) de lquido amnitico, hemorragia vaginal o dolor en el vientre (abdominal).  Realice actividad fsica todos los das, si no tiene restricciones. Consulte con el profesional que la asiste si no sabe con certeza si determinados ejercicios son seguros. El mayor aumento de peso se producir en los ltimos 2 trimestres del embarazo. El ejercicio ayuda a:  Controlar su peso.  Mantenerse en forma para el trabajo de parto y el parto .  Perder peso despus del parto.  Haga reposo con frecuencia, con las piernas elevadas, o segn lo necesite para evitar los calambres y el dolor de cintura.  Use un buen sostn o como los que se usan para hacer deportes para aliviar la sensibilidad de las mamas. Tambin puede serle til si lo usa mientras duerme. Si pierde calostro, podr utilizar apsitos en el sostn.  No utilice la baera con agua caliente, baos turcos y saunas.   Colquese el cinturn de seguridad cuando conduzca. Este la proteger a usted y al beb en caso de accidente.  Evite comer carne cruda y el contacto con los utensilios y desperdicios de los gatos. Estos   elementos contienen grmenes que pueden causar defectos de nacimiento en el beb.  Es fcil perder algo de orina durante el embarazo. Apretar y fortalecer los msculos de la pelvis la ayudar con este problema. Practique detener la miccin cuando est en el bao. Estos son los mismos msculos que necesita fortalecer. Son tambin los mismos msculos que utiliza cuando trata de evitar despedir gases. Puede practicar apretando estos msculos diez veces, y repetir esto tres veces por da aproximadamente. Una vez que conozca qu msculos debe apretar, no realice estos ejercicios durante la miccin. Puede favorecerle una infeccin si la orina vuelve hacia  atrs.  Pida ayuda si tienen necesidades financieras, teraputicas o nutricionales. El profesional podr ayudarla con respecto a estas necesidades, o derivarla a otros especialistas.  Haga una lista de nmeros telefnicos de emergencia y tngalos disponibles.  Planifique como obtener ayuda de familiares o amigos cuando regrese a casa desde el hospital.  Hacer un ensayo sobre la partida al hospital.  Tome clases prenatales con el padre para entender, practicar y hacer preguntas sobre el trabajo de parto y el alumbramiento.  Preparar la habitacin del beb / busque una guardera.  No viaje fuera de la ciudad a menos que sea absolutamente necesario y con el asesoramiento de su mdico.  Use slo zapatos de tacn bajo o sin tacn para tener mejor equilibrio y evitar cadas. USO DE MEDICAMENTOS Y CONSUMO DE DROGAS DURANTE EL EMBARAZO   Tome las vitaminas apropiadas para esta etapa tal como se le indic. Las vitaminas deben contener un miligramo de cido flico. Guarde todas las vitaminas fuera del alcance de los nios. La ingestin de slo un par de vitaminas o tabletas que contengan hierro pueden ocasionar la muerte en un beb o en un nio pequeo.  Evite el uso de todos los medicamentos, incluyendo hierbas, medicamentos de venta libre, sin receta o que no hayan sido sugeridos por su mdico. Slo tome medicamentos de venta libre o medicamentos recetados para el dolor, el malestar o fiebre como lo indique su mdico. No tome aspirina, ibuprofeno o naproxeno excepto que su mdico se lo indique.  Infrmele al profesional si consume alguna droga.  El alcohol se relaciona con ciertos defectos congnitos. Incluye el sndrome de alcoholismo fetal. Debe evitar absolutamente el consumo de alcohol, en cualquier forma. El fumar produce baja tasa de natalidad y bebs prematuros.  Las drogas ilegales o de la calle son muy perjudiciales para el beb. Estn absolutamente prohibidas. Un beb que nace de una  madre adicta, ser adicto al nacer. Ese beb tendr los mismos sntomas de abstinencia que un adulto. SOLICITE ATENCIN MDICA SI:  Tiene preguntas o preocupaciones relacionadas con el embarazo. Es mejor que llame para formular las preguntas si no puede esperar hasta la prxima visita, que sentirse preocupada por ellas.  SOLICITE ATENCIN MDICA DE INMEDIATO SI:   La temperatura oral le sube a ms de 38,9 C (102 F) o lo que su mdico le indique.  Tiene una prdida de lquido por la vagina (canal de parto). Si sospecha una ruptura de las membranas, tmese la temperatura y llame al profesional para informarlo sobre esto.  Observa unas pequeas manchas, una hemorragia vaginal o elimina cogulos. Notifique al profesional acerca de la cantidad y de cuntos apsitos est utilizando.  Presenta un olor desagradable en la secrecin vaginal y observa un cambio en el color, de transparente a blanco.  Ha vomitado durante ms de 24 horas.  Siente escalofros o le sube la fiebre.  Le   falta el [place].  Siente ardor al orinar.  Baja o sube ms de 2 libras (900 g), o segn lo indicado por el profesional que la asiste.  Observa que sbitamente se le hinchan el rostro, las manos, los pies o las piernas.  Siente dolor en el vientre (abdominal). Las molestias en el ligamento redondo son una causa benigna frecuente de dolor abdominal durante el embarazo. El profesional que la asiste deber evaluarla.  Presenta dolor de cabeza intenso que no se alivia.  Tiene problemas visuales, visin doble o borrosa.  Si no siente los movimientos del beb durante ms de 1 hora. Si piensa que el beb no se mueve tanto como lo haca habitualmente, coma algo que contenga azcar y recustese sobre el lado izquierdo durante una hora. El beb debe moverse al menos 4  5 veces por hora. Comunquese inmediatamente si el beb se mueve menos que lo indicado.  Se cae, se ve involucrada en un accidente automovilstico o sufre algn  tipo de traumatismo.  En su hogar hay violencia mental o fsica. Document Released: 02/25/2005 Document Revised: 02/10/2012 [employer] Patient Information 2014 [employer].  

## 2013-01-19 NOTE — Progress Notes (Signed)
U/S scheduled 01/23/13 at 330 pm.

## 2013-01-19 NOTE — Progress Notes (Signed)
P=81,   [employer].

## 2013-01-19 NOTE — Progress Notes (Signed)
No records yet from [facility] in [place]> TC today and if records not coming will order [place]. States dating by 3 month [place] (her only [place]). Reviewed PTB hx.  Had tDAP. Plans reviewed.

## 2013-01-23 ENCOUNTER — Ambulatory Visit (HOSPITAL_COMMUNITY)
Admission: RE | Admit: 2013-01-23 | Discharge: 2013-01-23 | Disposition: A | Discharge status: Home / Self Care | Payer: Medicaid - Out of State | Source: Ambulatory Visit | Attending: Obstetrics and Gynecology | Admitting: Obstetrics and Gynecology

## 2013-01-23 ENCOUNTER — Encounter (HOSPITAL_COMMUNITY): Payer: Self-pay

## 2013-01-23 ENCOUNTER — Inpatient Hospital Stay (HOSPITAL_COMMUNITY)
Admission: AD | Admit: 2013-01-23 | Discharge: 2013-01-23 | Disposition: A | Discharge status: Home / Self Care | Payer: Medicaid - Out of State | Source: Ambulatory Visit | Attending: Obstetrics & Gynecology | Admitting: Obstetrics & Gynecology

## 2013-01-23 DIAGNOSIS — O4700 False labor before 37 completed weeks of gestation, unspecified trimester: Secondary | ICD-10-CM | POA: Insufficient documentation

## 2013-01-23 DIAGNOSIS — IMO0002 Reserved for concepts with insufficient information to code with codable children: Secondary | ICD-10-CM

## 2013-01-23 DIAGNOSIS — Z3689 Encounter for other specified antenatal screening: Secondary | ICD-10-CM | POA: Insufficient documentation

## 2013-01-23 DIAGNOSIS — O368390 Maternal care for abnormalities of the fetal heart rate or rhythm, unspecified trimester, not applicable or unspecified: Secondary | ICD-10-CM

## 2013-01-23 HISTORY — DX: Anemia, unspecified: D64.9

## 2013-01-23 HISTORY — DX: Antepartum hemorrhage, unspecified, unspecified trimester: O46.90

## 2013-01-23 NOTE — MAU Note (Signed)
Pt states here from u/s. U?S told RN pt having decels noted during scan. Pt was having u/s for dating. Denies bleeding, does note vaginal discharge that is yellow, non-odorous.

## 2013-01-23 NOTE — MAU Provider Note (Signed)
Chief Complaint:  Decels during U/S    Shannon Myers is a 27 y.o.  [phone] with IUP at [redacted]w[redacted]d by presenting for Decels during U/S   Pt was seen today for routine anatomy scan. During the [place], she was noted to have variability in fetal heart rate with decels noted to the lowest rate of 113. Given this, she was sent to the MAU for eval. Pt denies any complaints.  Has good fetal movement. Occasional nonpainful contractions. No LOF, No VB.   Pt is a transfer of care at 30 weeks from [place] where she had been receiving her care. Records have been requested but I do not see them in the chart. She is currently receiving care at [facility]. Pt has a hx of PTD @ 28 weeks with her first pregnancy. All others were term deliveries. Not on 17-OHP currently. Current pregnancy has been uncomplicated per her report.       Menstrual History: OB History   Grav Para Term Preterm Abortions TAB SAB Ect Mult Living   4 3 2 1      3        Patient's last menstrual period was 04/03/2012.      Past Medical History  Diagnosis Date  . Preterm labor   . Anemia   . Vaginal bleeding in pregnancy     Last pregnancy    Past Surgical History  Procedure Laterality Date  . No past surgeries      Family History  Problem Relation Age of Onset  . Alcohol abuse Neg Hx   . Asthma Neg Hx   . Arthritis Neg Hx   . Birth defects Neg Hx   . Cancer Neg Hx   . COPD Neg Hx   . Depression Neg Hx   . Diabetes Neg Hx   . Drug abuse Neg Hx   . Early death Neg Hx   . Hearing loss Neg Hx   . Heart disease Neg Hx   . Hyperlipidemia Neg Hx   . Hypertension Neg Hx   . Kidney disease Neg Hx   . Learning disabilities Neg Hx   . Mental illness Neg Hx   . Mental retardation Neg Hx   . Miscarriages / Stillbirths Neg Hx   . Stroke Neg Hx   . Vision loss Neg Hx     History  Substance Use Topics  . Smoking status: Never Smoker   . Smokeless tobacco: Never Used  . Alcohol Use: No     No Known Allergies  Prescriptions prior to  admission  Medication Sig Dispense Refill  . Prenatal Vit-Fe Fumarate-FA (PRENATAL MULTIVITAMIN) TABS tablet Take 1 tablet by mouth daily.  30 tablet  2  . PRESCRIPTION MEDICATION Take 1 capsule by mouth daily. Pt reports taking a prescription Iron supplement; unknown name/strength      . [DISCONTINUED] ferrous sulfate 325 (65 FE) MG tablet Take 325 mg by mouth daily with breakfast.        Review of Systems - Negative except for what is mentioned in HPI.  Physical Exam  Last menstrual period 04/03/2012, not currently breastfeeding. GENERAL: Well-developed, well-nourished female in no acute distress.  LUNGS: Clear to auscultation bilaterally.  HEART: Regular rate and rhythm. ABDOMEN: Soft, nontender, nondistended, gravid.  EXTREMITIES: Nontender, no edema, 2+ distal pulses. FHT:  Baseline rate 110-120 bpm   Variability moderate  Accelerations present   Decelerations none Contractions: occasional   Labs: No results found for this or any previous visit (from  the past 24 hour(s)).  Imaging Studies:  US Ob Comp + 14 Wk Placenta fundal/AFI WNL/vtx/normal anatomy/EFW 54%ile.    Assessment: Shannon Myers is  27 y.o. [phone] at [redacted]w[redacted]d presents with Decels during U/S  .  Plan: - monitored on NST for 2 hours. FHR baseline between 110 and 120 with numerous accels.  Category I tracing.  Suspect what was noted as decels on [place] was likely return to baseline.  - reassurance given to pt - f/u as scheduled in [facility] on 9/4 - message sent to admin to request records again.  - discharge to home.   Jaimey Franchini L 8/25/20147:37 PM

## 2013-01-23 NOTE — MAU Provider Note (Signed)
Attestation of Attending Supervision of Fellow: Evaluation and management procedures were performed by the Fellow under my supervision and collaboration.  I have reviewed the Fellow's note and chart, and I agree with the management and plan.    

## 2013-02-01 ENCOUNTER — Encounter: Payer: Self-pay | Admitting: *Deleted

## 2013-02-02 ENCOUNTER — Encounter: Payer: Self-pay | Admitting: Family Medicine

## 2013-02-02 ENCOUNTER — Ambulatory Visit (INDEPENDENT_AMBULATORY_CARE_PROVIDER_SITE_OTHER): Payer: Medicaid - Out of State | Admitting: Family Medicine

## 2013-02-02 ENCOUNTER — Encounter (HOSPITAL_COMMUNITY): Payer: Self-pay | Admitting: *Deleted

## 2013-02-02 ENCOUNTER — Inpatient Hospital Stay (HOSPITAL_COMMUNITY)
Admission: AD | Admit: 2013-02-02 | Discharge: 2013-02-03 | DRG: 775 | Disposition: A | Discharge status: Home / Self Care | Payer: Medicaid Other | Source: Ambulatory Visit | Attending: Obstetrics & Gynecology | Admitting: Obstetrics & Gynecology

## 2013-02-02 VITALS — BP 113/77 | Wt 183.9 lb

## 2013-02-02 DIAGNOSIS — Z3480 Encounter for supervision of other normal pregnancy, unspecified trimester: Secondary | ICD-10-CM

## 2013-02-02 DIAGNOSIS — O09213 Supervision of pregnancy with history of pre-term labor, third trimester: Secondary | ICD-10-CM

## 2013-02-02 DIAGNOSIS — O09219 Supervision of pregnancy with history of pre-term labor, unspecified trimester: Secondary | ICD-10-CM

## 2013-02-02 LAB — CBC
HCT: 35.5 % — ABNORMAL LOW (ref 36.0–46.0)
Hemoglobin: 12.3 g/dL (ref 12.0–15.0)
MCH: 27.9 pg (ref 26.0–34.0)
MCHC: 34.6 g/dL (ref 30.0–36.0)
RDW: 14.3 % (ref 11.5–15.5)

## 2013-02-02 LAB — POCT URINALYSIS DIP (DEVICE)
Nitrite: NEGATIVE
Protein, ur: NEGATIVE mg/dL
Urobilinogen, UA: 0.2 mg/dL (ref 0.0–1.0)

## 2013-02-02 LAB — ABO/RH: ABO/RH(D): O POS

## 2013-02-02 LAB — GROUP B STREP BY PCR: Group B strep by PCR: NEGATIVE

## 2013-02-02 LAB — TYPE AND SCREEN: ABO/RH(D): O POS

## 2013-02-02 LAB — HEPATITIS B SURFACE ANTIBODY,QUALITATIVE: Hep B S Ab: NEGATIVE

## 2013-02-02 LAB — RPR: RPR Ser Ql: NONREACTIVE

## 2013-02-02 MED ORDER — PRENATAL MULTIVITAMIN CH
1.0000 | ORAL_TABLET | Freq: Every day | ORAL | Status: DC
  Filled 2013-02-02: qty 1

## 2013-02-02 MED ORDER — TETANUS-DIPHTH-ACELL PERTUSSIS 5-2.5-18.5 LF-MCG/0.5 IM SUSP: 0.5000 mL | Freq: Once | INTRAMUSCULAR | Status: DC

## 2013-02-02 MED ORDER — DIPHENHYDRAMINE HCL 25 MG PO CAPS: 25.0000 mg | ORAL_CAPSULE | Freq: Four times a day (QID) | ORAL | Status: DC | PRN

## 2013-02-02 MED ORDER — SIMETHICONE 80 MG PO CHEW: 80.0000 mg | CHEWABLE_TABLET | ORAL | Status: DC | PRN

## 2013-02-02 MED ORDER — LANOLIN HYDROUS EX OINT: TOPICAL_OINTMENT | CUTANEOUS | Status: DC | PRN

## 2013-02-02 MED ORDER — WITCH HAZEL-GLYCERIN EX PADS: 1.0000 "application " | MEDICATED_PAD | CUTANEOUS | Status: DC | PRN

## 2013-02-02 MED ORDER — BENZOCAINE-MENTHOL 20-0.5 % EX AERO
1.0000 "application " | INHALATION_SPRAY | CUTANEOUS | Status: DC | PRN
  Administered 2013-02-02: 1 via TOPICAL
  Filled 2013-02-02: qty 56

## 2013-02-02 MED ORDER — ONDANSETRON HCL 4 MG/2ML IJ SOLN: 4.0000 mg | Freq: Four times a day (QID) | INTRAMUSCULAR | Status: DC | PRN

## 2013-02-02 MED ORDER — ACETAMINOPHEN 325 MG PO TABS: 650.0000 mg | ORAL_TABLET | ORAL | Status: DC | PRN

## 2013-02-02 MED ORDER — IBUPROFEN 600 MG PO TABS
600.0000 mg | ORAL_TABLET | Freq: Four times a day (QID) | ORAL | Status: DC
  Filled 2013-02-02 (×2): qty 1

## 2013-02-02 MED ORDER — LACTATED RINGERS IV SOLN: 500.0000 mL | INTRAVENOUS | Status: DC | PRN

## 2013-02-02 MED ORDER — SENNOSIDES-DOCUSATE SODIUM 8.6-50 MG PO TABS
2.0000 | ORAL_TABLET | Freq: Every day | ORAL | Status: DC
  Administered 2013-02-02: 2 via ORAL

## 2013-02-02 MED ORDER — OXYCODONE-ACETAMINOPHEN 5-325 MG PO TABS
1.0000 | ORAL_TABLET | ORAL | Status: DC | PRN
  Administered 2013-02-02: 1 via ORAL
  Filled 2013-02-02: qty 1

## 2013-02-02 MED ORDER — ONDANSETRON HCL 4 MG/2ML IJ SOLN: 4.0000 mg | INTRAMUSCULAR | Status: DC | PRN

## 2013-02-02 MED ORDER — DIBUCAINE 1 % RE OINT: 1.0000 "application " | TOPICAL_OINTMENT | RECTAL | Status: DC | PRN

## 2013-02-02 MED ORDER — LIDOCAINE HCL (PF) 1 % IJ SOLN
30.0000 mL | INTRAMUSCULAR | Status: DC | PRN
  Filled 2013-02-02 (×2): qty 30

## 2013-02-02 MED ORDER — OXYTOCIN BOLUS FROM INFUSION: 500.0000 mL | INTRAVENOUS | Status: DC

## 2013-02-02 MED ORDER — OXYCODONE-ACETAMINOPHEN 5-325 MG PO TABS: 1.0000 | ORAL_TABLET | ORAL | Status: DC | PRN

## 2013-02-02 MED ORDER — IBUPROFEN 600 MG PO TABS
600.0000 mg | ORAL_TABLET | Freq: Four times a day (QID) | ORAL | Status: DC | PRN
  Administered 2013-02-02: 600 mg via ORAL
  Filled 2013-02-02: qty 1

## 2013-02-02 MED ORDER — FENTANYL CITRATE 0.05 MG/ML IJ SOLN
100.0000 ug | INTRAMUSCULAR | Status: DC | PRN
  Administered 2013-02-02: 50 ug via INTRAVENOUS
  Filled 2013-02-02: qty 2

## 2013-02-02 MED ORDER — ZOLPIDEM TARTRATE 5 MG PO TABS: 5.0000 mg | ORAL_TABLET | Freq: Every evening | ORAL | Status: DC | PRN

## 2013-02-02 MED ORDER — ONDANSETRON HCL 4 MG PO TABS: 4.0000 mg | ORAL_TABLET | ORAL | Status: DC | PRN

## 2013-02-02 MED ORDER — CITRIC ACID-SODIUM CITRATE 334-500 MG/5ML PO SOLN: 30.0000 mL | ORAL | Status: DC | PRN

## 2013-02-02 MED ORDER — LACTATED RINGERS IV SOLN: INTRAVENOUS | Status: DC

## 2013-02-02 MED ORDER — OXYTOCIN 40 UNITS IN LACTATED RINGERS INFUSION - SIMPLE MED
62.5000 mL/h | INTRAVENOUS | Status: DC
  Administered 2013-02-02: 62.5 mL/h via INTRAVENOUS
  Filled 2013-02-02: qty 1000

## 2013-02-02 NOTE — Progress Notes (Signed)
Probable early labor--to walk and return for re-check in 1 hour.--Recheck shows 4-5/100/BBOW--for admission--needs rapid GBS. Contractions began 6 am.

## 2013-02-02 NOTE — Patient Instructions (Signed)
Embarazo  Tercer trimestre  (Pregnancy - Third Trimester) El tercer trimestre del embarazo (los ltimos 3 meses) es el perodo en el cual tanto usted como su beb crecen con ms rapidez. El beb alcanza un largo de aproximadamente 50 cm. y pesa entre 2,700 y 4,500 kg. El beb gana ms tejido graso y est listo para la vida fuera del cuerpo de la madre. Mientras estn en el interior, los bebs tienen perodos de sueo y vigilia, succionan el pulgar y tienen hipo. Quizs sienta pequeas contracciones del tero. Este es el falso trabajo de parto. Tambin se las conoce como contracciones de Braxton-Hicks . Es como una prctica del parto. Los problemas ms habituales de esta etapa del embarazo incluyen mayor dificultad para respirar, hinchazn de las manos y los pies por retencin de lquidos y la necesidad de orinar con ms frecuencia debido a que el tero y el beb presionan sobre la vejiga.  EXAMENES PRENATALES   Durante los exmenes prenatales, deber seguir realizndose anlisis de sangre. Estas pruebas se realizan para controlar su salud y la del beb. Los anlisis de sangre se realizan para conocer los niveles de algunos compuestos de la sangre (hemoglobina). La anemia (bajo nivel de hemoglobina) es frecuente durante el embarazo. Para prevenirla, se administran hierro y vitaminas. Tambin le tomarn nuevas anlisis para descartar diabetes. Podrn repetirle algunas de las pruebas que le hicieron previamente.  En cada visita le medirn el tamao del tero. Esto permite asegurar que el beb se desarrolla adecuadamente, segn la fecha del embarazo.  Le controlarn la presin arterial en cada visita prenatal. Esto es para asegurarse de que no sufre toxemia.  Le harn un anlisis de orina en cada visita prenatal, para descartar infecciones, diabetes y la presencia de protenas.  Tambin en cada visita controlarn su peso. Esto se realiza para asegurarse que aumenta de peso al ritmo indicado y que usted y  su beb evolucionan normalmente.  En algunas ocasiones se realiza una prueba de ultrasonido para confirmar el correcto desarrollo y evolucin del beb. Esta prueba se realiza con ondas sonoras inofensivas para el beb, de modo que el profesional pueda calcular ms precisamente la fecha del parto.  Analice con su mdico los analgsicos y la anestesia que recibir durante el trabajo de parto y el parto.  Comente la posibilidad de que necesite una cesrea y qu anestesia se recibir.  Informe a su mdico si sufre violencia familiar mental o fsica. A veces, se indica la prueba especializada sin estrs, la prueba de tolerancia a las contracciones y el perfil biofsico para asegurarse de que el beb no tiene problemas. El estudio del lquido amnitico que rodea al beb se llama amniocentesis. El lquido amnitico se obtiene introduciendo una aguja en el vientre (abdomen ). En ocasiones se lleva a cabo cerca del final del embarazo, si es necesario inducir a un parto. En este caso se realiza para asegurarse que los pulmones del beb estn lo suficientemente maduros como para que pueda vivir fuera del tero. Si los pulmones no han madurado y es peligroso que el beb nazca, se administrar a la madre una inyeccin de cortisona , 1 a 2 das antes del parto. . Esto ayuda a que los pulmones del beb maduren y sea ms seguro su nacimiento.  CAMBIOS QUE OCURREN EN EL TERCER TRIMESTRE DEL EMBARAZO  Su organismo atravesar numerosos cambios durante el embarazo. Estos pueden variar de una persona a otra. Converse con el profesional que la asiste acerca los cambios que   usted note y que la preocupen.   Durante el ltimo trimestre probablemente sienta un aumento del apetito. Es normal tener "antojos" de ciertas comidas. Esto vara de una persona a otra y de un embarazo a otro.  Podrn aparecer las primeras estras en las caderas, abdomen y mamas. Estos son cambios normales del cuerpo durante el embarazo. No existen  medicamentos ni ejercicios que puedan prevenir estos cambios.  La constipacin puede tratarse con un laxante o agregando fibra a su dieta. Beber grandes cantidades de lquidos, tomar fibras en forma de vegetales, frutas y granos integrales es de gran ayuda.  Tambin es beneficioso practicar actividad fsica. Si ha sido una persona activa hasta el embarazo, podr continuar con la mayora de las actividades durante el mismo. Si ha sido menos activa, puede ser beneficioso que comience con un programa de ejercicios, como realizar caminatas. Consulte con el profesional que la asiste antes de comenzar un programa de ejercicios.  Evite el consumo de cigarrillos, el alcohol, los medicamentos no recetados y las "drogas de la calle" durante el embarazo. Estas sustancias qumicas afectan la formacin y el desarrollo del beb. Evite estas sustancias durante todo el embarazo para asegurar el nacimiento de un beb sano.  Podr sentir dolor de espalda, tener vrices en las venas y hemorroides, o si ya los sufra, pueden empeorar.  Durante el tercer trimestre se cansar con ms facilidad, lo cual es normal.  Los movimientos del beb pueden ser ms fuertes y con ms frecuencia.  Puede que note dificultades para respirar normalmente.  El ombligo puede salir hacia afuera.  A veces sale una secrecin amarilla de las mamas, que se llama calostro.  Podr aparecer una secrecin mucosa con sangre. Esto suele ocurrir entre unos pocos das y una semana antes del parto. INSTRUCCIONES PARA EL CUIDADO EN EL HOGAR   Cumpla con las citas de control. Siga las indicaciones del mdico con respecto al uso de medicamentos, los ejercicios y la dieta.  Durante el embarazo debe obtener nutrientes para usted y para su beb. Consuma alimentos balanceados a intervalos regulares. Elija alimentos como carne, pescado, leche y otros productos lcteos descremados, vegetales, frutas, panes integrales y cereales. El mdico le informar  cul es el aumento de peso ideal.  Las relaciones sexuales pueden continuarse hasta casi el final del embarazo, si no se presentan otros problemas como prdida prematura (antes de tiempo) de lquido amnitico, hemorragia vaginal o dolor en el vientre (abdominal).  Realice actividad fsica todos los das, si no tiene restricciones. Consulte con el profesional que la asiste si no sabe con certeza si determinados ejercicios son seguros. El mayor aumento de peso se producir en los ltimos 2 trimestres del embarazo. El ejercicio ayuda a:  Controlar su peso.  Mantenerse en forma para el trabajo de parto y el parto .  Perder peso despus del parto.  Haga reposo con frecuencia, con las piernas elevadas, o segn lo necesite para evitar los calambres y el dolor de cintura.  Use un buen sostn o como los que se usan para hacer deportes para aliviar la sensibilidad de las mamas. Tambin puede serle til si lo usa mientras duerme. Si pierde calostro, podr utilizar apsitos en el sostn.  No utilice la baera con agua caliente, baos turcos y saunas.   Colquese el cinturn de seguridad cuando conduzca. Este la proteger a usted y al beb en caso de accidente.  Evite comer carne cruda y el contacto con los utensilios y desperdicios de los gatos. Estos   elementos contienen grmenes que pueden causar defectos de nacimiento en el beb.  Es fcil perder algo de orina durante el embarazo. Apretar y fortalecer los msculos de la pelvis la ayudar con este problema. Practique detener la miccin cuando est en el bao. Estos son los mismos msculos que necesita fortalecer. Son tambin los mismos msculos que utiliza cuando trata de evitar despedir gases. Puede practicar apretando estos msculos diez veces, y repetir esto tres veces por da aproximadamente. Una vez que conozca qu msculos debe apretar, no realice estos ejercicios durante la miccin. Puede favorecerle una infeccin si la orina vuelve hacia  atrs.  Pida ayuda si tienen necesidades financieras, teraputicas o nutricionales. El profesional podr ayudarla con respecto a estas necesidades, o derivarla a otros especialistas.  Haga una lista de nmeros telefnicos de emergencia y tngalos disponibles.  Planifique como obtener ayuda de familiares o amigos cuando regrese a casa desde el hospital.  Hacer un ensayo sobre la partida al hospital.  Tome clases prenatales con el padre para entender, practicar y hacer preguntas sobre el trabajo de parto y el alumbramiento.  Preparar la habitacin del beb / busque una guardera.  No viaje fuera de la ciudad a menos que sea absolutamente necesario y con el asesoramiento de su mdico.  Use slo zapatos de tacn bajo o sin tacn para tener mejor equilibrio y evitar cadas. USO DE MEDICAMENTOS Y CONSUMO DE DROGAS DURANTE EL EMBARAZO   Tome las vitaminas apropiadas para esta etapa tal como se le indic. Las vitaminas deben contener un miligramo de cido flico. Guarde todas las vitaminas fuera del alcance de los nios. La ingestin de slo un par de vitaminas o tabletas que contengan hierro pueden ocasionar la muerte en un beb o en un nio pequeo.  Evite el uso de todos los medicamentos, incluyendo hierbas, medicamentos de venta libre, sin receta o que no hayan sido sugeridos por su mdico. Slo tome medicamentos de venta libre o medicamentos recetados para el dolor, el malestar o fiebre como lo indique su mdico. No tome aspirina, ibuprofeno o naproxeno excepto que su mdico se lo indique.  Infrmele al profesional si consume alguna droga.  El alcohol se relaciona con ciertos defectos congnitos. Incluye el sndrome de alcoholismo fetal. Debe evitar absolutamente el consumo de alcohol, en cualquier forma. El fumar produce baja tasa de natalidad y bebs prematuros.  Las drogas ilegales o de la calle son muy perjudiciales para el beb. Estn absolutamente prohibidas. Un beb que nace de una  madre adicta, ser adicto al nacer. Ese beb tendr los mismos sntomas de abstinencia que un adulto. SOLICITE ATENCIN MDICA SI:  Tiene preguntas o preocupaciones relacionadas con el embarazo. Es mejor que llame para formular las preguntas si no puede esperar hasta la prxima visita, que sentirse preocupada por ellas.  SOLICITE ATENCIN MDICA DE INMEDIATO SI:   La temperatura oral le sube a ms de 38,9 C (102 F) o lo que su mdico le indique.  Tiene una prdida de lquido por la vagina (canal de parto). Si sospecha una ruptura de las membranas, tmese la temperatura y llame al profesional para informarlo sobre esto.  Observa unas pequeas manchas, una hemorragia vaginal o elimina cogulos. Notifique al profesional acerca de la cantidad y de cuntos apsitos est utilizando.  Presenta un olor desagradable en la secrecin vaginal y observa un cambio en el color, de transparente a blanco.  Ha vomitado durante ms de 24 horas.  Siente escalofros o le sube la fiebre.  Le   falta el [place].  Siente ardor al orinar.  Baja o sube ms de 2 libras (900 g), o segn lo indicado por el profesional que la asiste.  Observa que sbitamente se le hinchan el rostro, las manos, los pies o las piernas.  Siente dolor en el vientre (abdominal). Las molestias en el ligamento redondo son una causa benigna frecuente de dolor abdominal durante el embarazo. El profesional que la asiste deber evaluarla.  Presenta dolor de cabeza intenso que no se alivia.  Tiene problemas visuales, visin doble o borrosa.  Si no siente los movimientos del beb durante ms de 1 hora. Si piensa que el beb no se mueve tanto como lo haca habitualmente, coma algo que contenga azcar y recustese sobre el lado izquierdo durante una hora. El beb debe moverse al menos 4  5 veces por hora. Comunquese inmediatamente si el beb se mueve menos que lo indicado.  Se cae, se ve involucrada en un accidente automovilstico o sufre algn  tipo de traumatismo.  En su hogar hay violencia mental o fsica. Document Released: 02/25/2005 Document Revised: 02/10/2012 [employer] Patient Information 2014 [employer].  Lactancia materna  (Breastfeeding)  El cambio hormonal durante el embarazo produce el desarrollo del tejido mamario y un aumento en el nmero y tamao de los conductos galactforos. La hormona prolactina permite que las protenas, los azcares y las grasas de la sangre produzcan la leche materna en las glndulas productoras de leche. La hormona progesterona impide que la leche materna sea liberada antes del nacimiento del beb. Despus del nacimiento del beb, su nivel de progesterona disminuye permitiendo que la leche materna sea liberada. Pensar en el beb, as como la succin o el llanto, pueden estimular la liberacin de leche de las glndulas productoras de leche.  La decisin de amamantar (lactar) es una de las mejores opciones que usted puede hacer para usted y su beb. La informacin que sigue da una breve resea de los beneficios, as como otras caractersticas importantes que debe saber sobre la lactancia materna.  LOS BENEFICIOS DE AMAMANTAR  Para el beb   La primera leche (calostro) ayuda al mejor funcionamiento del sistema digestivo del beb.   La leche tiene anticuerpos que provienen de la madre y que ayudan a prevenir las infecciones en el beb.   El beb tiene una menor incidencia de asma, alergias y del sndrome de muerte sbita del lactante (SMSL).   Los nutrientes de la leche materna son mejores para el beb que la leche maternizada.  La leche materna mejora el desarrollo cerebral del beb.   Su beb tendr menos gases, clicos y estreimiento.  Es menos probable que el beb desarrolle otras enfermedades, como obesidad infantil, asma o diabetes mellitus. Para usted   La lactancia materna favorece el desarrollo de un vnculo muy especial entre la madre y el beb.   Es ms conveniente,  siempre disponible, a la temperatura adecuada y econmica.   La lactancia materna ayuda a quemar caloras y a perder el peso ganado durante el embarazo.   Hace que el tero se contraiga ms rpidamente a su tamao normal y disminuye el sangrado despus del parto.   Las madres que amamantan tienen menos riesgo de desarrollar osteoporosis o cncer de mama o de ovario en el futuro.  FRECUENCIA DEL AMAMANTAMIENTO   Un beb sano, nacido a trmino, puede amamantarse con tanta frecuencia como cada hora, o espaciar las comidas cada tres horas. La frecuencia en la lactancia varan de un beb a   otro.   Los recin nacidos deben ser alimentados por lo menos cada 2-3 horas durante el da y cada 4-5 horas durante la noche. Usted debe amamantarlo un mnimo de 8 tomas en un perodo de 24 horas.  Despierte al beb para amamantarlo si han pasado 3-4 horas desde la ltima comida.  Amamante cuando sienta la necesidad de reducir la plenitud de sus senos o cuando el beb muestre signos de hambre. Las seales de que el beb puede tener hambre son:  Aumenta su estado de alerta o vigilancia.  Se estira.  Mueve la cabeza de un lado a otro.  Mueve la cabeza y abre la boca cuando se le toca la mejilla o la boca (reflejo de succin).  Aumenta las vocalizaciones, tales como sonidos de succin, relamerse los labios, arrullos, suspiros, o chirridos.  Mueve la mano hacia la boca.  Se chupa con ganas los dedos o las manos.  Agitacin.  Llanto intermitente.  Los signos de hambre extrema requerirn que lo calme y lo consuele antes de tratar de alimentarlo. Los signos de hambre extrema son:  Agitacin.  Llanto fuerte e intenso.  Gritos.  El amamantamiento frecuente la ayudar a producir ms leche y a prevenir problemas de dolor en los pezones e hinchazn de las mamas.  LACTANCIA MATERNA   Ya sea que se encuentre acostada o sentada, asegrese que el abdomen del beb est enfrente el suyo.   Sostenga  la mama con el pulgar por arriba y los otros 4 dedos por debajo del pezn. Asegrese que sus dedos se encuentren lejos del pezn y de la boca del beb.   Empuje suavemente los labios del beb con el pezn o con el dedo.   Cuando la boca del beb se abra lo suficiente, introduzca el pezn y la zona oscura que lo rodea (areola) tanto como le sea posible dentro de la boca.  Debe haber ms areola visible por arriba del labio superior que por debajo del labio inferior.  La lengua del beb debe estar entre la enca inferior y el seno.  Asegrese de que la boca del beb est en la posicin correcta alrededor del pezn (prendida). Los labios del beb deben crear un sello sobre su pecho.  Las seales de que el beb se ha prendido eficazmente al pezn son:  Tironea o succiona sin dolor.  Se escucha que traga entre las succiones.  No hace ruidos ni chasquidos.  Hay movimientos musculares por arriba y por delante de sus odos al succionar.  El beb debe succionar unos 2-3 minutos para que salga la leche. Permita que el nio se alimente en cada mama todo lo que desee. Alimente al beb hasta que se desprenda o se quede dormido en el primer pecho y luego ofrzcale el segundo pecho.  Las seales de que el beb est lleno y satisfecho son:  Disminuye gradualmente el nmero de succiones o no succiona.  Se queda dormido.  Extiende o relaja su cuerpo.  Retiene una pequea cantidad de leche en la boca.  Se desprende del pecho por s mismo.  Los signos de una lactancia materna eficaz son:  Los senos han aumentado la firmeza, el peso y el tamao antes de la alimentacin.  Son ms blandos despus de amamantar.  Un aumento del volumen de leche, y tambin el cambio de su consistencia y color se producen hacia el quinto da de lactancia materna.  La congestin mamaria se alivia al dar de mamar.  Los pezones no duelen,   ni estn agrietados ni sangran.  De ser necesario, interrumpa la succin  poniendo su dedo en la esquina de la boca del beb y deslizando el dedo entre sus encas. A continuacin, retire la mama de su boca.  Es comn que los bebs regurgiten un poco despus de comer.  A menudo los bebs tragan [place] al alimentarse. Esto puede hacer que se sienta molesto. Hacer eructar al beb al cambiar de pecho puede ser de ayuda.  Se recomiendan suplementos de vitamina D para los bebs que reciben slo leche materna.  Evite el uso del chupete durante las primeras 4 a 6 semanas de vida.  Evite la alimentacin suplementaria con agua, frmula o jugo en lugar de la leche materna. La leche materna es todo el alimento que el beb necesita. No es necesario que el nio ingiera agua o preparados de bibern. Sus pechos producirn ms leche si se evita la alimentacin suplementaria durante las primeras semanas. COMO SABER SI EL BEB OBTIENE LA SUFICIENTE LECHE MATERNA  Preguntarse si el beb obtiene la cantidad suficiente de leche es una preocupacin frecuente entre las madres. Puede asegurarse que el beb tiene la leche suficiente si:   El beb succiona activamente y usted escucha que traga.   El beb parece estar relajado y satisfecho despus de mamar.   El nio se alimenta al menos 8 a 12 veces en 24 horas.  Durante los primeros 3 a 5 das de vida:  Moja 3-5 paales en 24 horas. La materia fecal debe ser blanda y amarillenta.  Tiene al menos 3 a 4 deposiciones en 24 horas. La materia fecal debe ser blanda y amarillenta.  A los 5-7 das de vida, el beb debe tener al menos 3-6 deposiciones en 24 horas. La materia fecal debe ser grumosa y amarilla a los 5 das de vida.  Su beb tiene una prdida de peso menor a 7al 10% durante los primeros 3 das de vida.  El beb no pierde peso despus de 3-7 das de vida.  El beb debe aumentar 4 a 6 libras (120 a 170 gr.) por semana despus de los 4 das de vida.  Aumenta de peso a los 5 das de vida y vuelve al peso del nacimiento dentro de  las 2 semanas. CONGESTIN MAMARIA  Durante la primera semana despus del parto, usted puede experimentar hinchazn en las mamas (congestin mamaria). Al estar congestionadas, las mamas se sienten pesadas, calientes o sensibles al tacto. El pico de la congestin ocurre a las 24 -48 horas despus del parto.   La congestin puede disminuirse:  Continuando con la lactancia materna.  Aumentando la frecuencia.  Tomando duchas calientes o aplicando calor hmedo en los senos antes de cada comida. Esto aumenta la circulacin y ayuda a que la leche fluya.   Masajeando suavemente el pecho antes y durante las comidas. Con las yemas de los dedos, masajee la pared del pecho hacia el pezn en un movimiento circular.   Asegurarse de que el beb vaca al menos uno de sus pechos en cada alimentacin. Tambin ayuda si comienza la siguiente toma en el otro seno.   Extraiga manualmente o con un sacaleches las mamas para vaciar los pechos si el beb tiene sueo o no se aliment bien. Tambin puede extraer la leche cuando vuelva a trabajar o si siente que se estn congestionando las mamas.  Asegrese de que el beb se prende y est bien colocado durante la lactancia. Si sigue estas indicaciones, la congestin debe mejorar   en 24 a 48 horas. Si an tiene dificultades, consulte a su asesor en lactancia.  CUDESE USTED MISMA  Cuide sus mamas.   Bese o dchese diariamente.   Evite usar jabn en los pezones.   Use un sostn de soporte Evite el uso de sostenes con aro.  Seque al Janissa sus pezones durante 3-4 minutos despus de cada comida.   Utilice slo apsitos de algodn en el sostn para absorber las prdidas de leche. La prdida de un poco de leche materna entre las comidas es normal.   Use solamente lanolina pura en sus pezones despus de amamantar. Usted no tiene que lavarla antes de alimentar al beb. Otra opcin es sacarse unas gotas de leche y masajear suavemente los pezones.  Continuar con  los autocontroles de la mama. Cudese.   Consuma alimentos saludables. Alterne 3 comidas con 3 colaciones.  Evite los alimentos que usted nota que perjudican al beb.  Beba leche, jugos de fruta y agua para satisfacer su sed (aproximadamente 8 vasos al da).   Descanse con frecuencia, reljese y tome sus vitaminas prenatales para evitar la fatiga, el estrs y la anemia.  Evite masticar y fumar tabaco.  Evite el consumo de alcohol y drogas.  Tome medicamentos de venta libre y recetados tal como le indic su mdico o farmacutico. Siempre debe consultar con su mdico o farmacutico antes de tomar cualquier medicamento, vitamina o suplemento de hierbas.  Sepa que durante la lactancia puede quedar embarazada. Si lo desea, hable con su mdico acerca de la planificacin familiar y los mtodos anticonceptivos seguros que puede utilizar durante la lactancia. SOLICITE ATENCIN MDICA SI:   Usted siente que quiere dejar de amamantar o se siente frustrada con la lactancia.  Siente dolor en los senos o en los pezones.  Sus pezones estn agrietados o sangran.  Sus pechos estn irritados, sensibles o calientes.  Tiene un rea hinchada en cualquiera de los senos.  Siente escalofros o fiebre.  Tiene nuseas o vmitos.  Observa un drenaje en los pezones.  Sus mamas no se llenan antes de amamantarlo al 5to da despus del parto.  Se siente triste y deprimida.  El nio est demasiado somnoliento como para comer.  El nio tiene problemas para respirar.   Moja menos de 3 paales en 24 horas.  Mueve el intestino menos de 3 veces en 24 horas.  La piel del beb o la parte blanca de sus ojos est ms amarilla.   El beb no ha aumentado de peso a los 5 das de vida. ASEGRESE DE QUE:   Comprende estas instrucciones.  Controlar su enfermedad.  Solicitar ayuda de inmediato si no mejora o si empeora. Document Released: 05/18/2005 Document Revised: 02/10/2012 [employer] Patient  Information 2014 [employer].  

## 2013-02-02 NOTE — Assessment & Plan Note (Signed)
Continue routine prenatal care.  

## 2013-02-02 NOTE — Progress Notes (Signed)
Pulse: 78

## 2013-02-02 NOTE — H&P (Signed)
Shannon Myers is a 27 y.o. female presenting for Spontaneous onset of labor. Pt seen in clinic with change on recheck. Sent to L&D. Making rapid change. No complications with this labor. Most of prenatal care in [place] and recently transferred to our clinic. History OB History   Grav Para Term Preterm Abortions TAB SAB Ect Mult Living   4 3 2 1      3      Past Medical History  Diagnosis Date  . Preterm labor   . Anemia   . Vaginal bleeding in pregnancy     Last pregnancy   Past Surgical History  Procedure Laterality Date  . No past surgeries     Family History: family history is negative for Alcohol abuse, Asthma, Arthritis, Birth defects, Cancer, COPD, Depression, Diabetes, Drug abuse, Early death, Hearing loss, Heart disease, Hyperlipidemia, Hypertension, Kidney disease, Learning disabilities, Mental illness, Mental retardation, Miscarriages / Stillbirths, Stroke, and Vision loss. Social History:  reports that she has never smoked. She has never used smokeless tobacco. She reports that she does not drink alcohol or use illicit drugs.   Prenatal Transfer Tool  Maternal Diabetes: No Genetic Screening: Normal Maternal Ultrasounds/Referrals: Normal per Mother Fetal Ultrasounds or other Referrals:  None Maternal Substance Abuse:  No Significant Maternal Medications:  Meds include: Other:  Iron/ PNV Significant Maternal Lab Results:  None Other Comments:  Pending GBS, no tx per risk factors of term labor  ROS +CTx, no lof, no vb, +FM No f/c, cp, sob, n/v, d/c, weakness  Dilation: 9 Effacement (%): 100 Exam by:: Deon Ivey Last menstrual period 05/03/2012, not currently breastfeeding. Exam Physical Exam  Constitutional: She is oriented to person, place, and time. She appears well-developed and well-nourished. No distress.  HENT:  Head: Normocephalic and atraumatic.  Very poor dentention   Eyes: Conjunctivae and EOM are normal.  Cardiovascular: Normal rate, regular rhythm, normal heart  sounds and intact distal pulses.  Exam reveals no gallop and no friction rub.   No murmur heard. Respiratory: Effort normal and breath sounds normal. No respiratory distress. She has no wheezes. She has no rales. She exhibits no tenderness.  GI: Soft. Bowel sounds are normal. She exhibits no distension (leopold 3800g) and no mass. There is no tenderness. There is no rebound and no guarding.  Musculoskeletal: Normal range of motion.  Neurological: She is alert and oriented to person, place, and time. She has normal reflexes. No cranial nerve deficit.  Skin: She is not diaphoretic.    Vtx by leopold  FHT: 140s mod var +accels no decels Toco q47min  Prenatal labs: ABO, Rh:   Antibody:   Rubella:   RPR: NON REAC (08/07 0941)  HBsAg:    HIV: NON REACTIVE (08/07 0941)  GBS:     Assessment/Plan: Shannon Myers is a 27 y.o. [id number] at [redacted]w[redacted]d in active labor  #Labor: SOOL, expectant managment #Pain: Desires IV pain meds #FWB: Cat I  #ID: Pending GBS, no tx by risk factors #MOF: Breast/Bottle #MOC: Nexplanon  Shannon Myers 02/02/2013, 12:50 PM

## 2013-02-03 ENCOUNTER — Encounter (HOSPITAL_COMMUNITY): Payer: Self-pay | Admitting: *Deleted

## 2013-02-03 LAB — CBC
HCT: 32.3 % — ABNORMAL LOW (ref 36.0–46.0)
Platelets: 157 10*3/uL (ref 150–400)
RBC: 4.03 MIL/uL (ref 3.87–5.11)
RDW: 14.2 % (ref 11.5–15.5)
WBC: 10.7 10*3/uL — ABNORMAL HIGH (ref 4.0–10.5)

## 2013-02-03 MED ORDER — IBUPROFEN 600 MG PO TABS: 600.0000 mg | ORAL_TABLET | Freq: Four times a day (QID) | ORAL | Status: DC | PRN

## 2013-02-03 NOTE — Progress Notes (Signed)
UR chart review completed.  

## 2013-02-03 NOTE — Discharge Summary (Signed)
Obstetric Discharge Summary Reason for Admission: onset of labor Prenatal Procedures: ultrasound Intrapartum Procedures: spontaneous vaginal delivery Postpartum Procedures: none Complications-Operative and Postpartum: none Hemoglobin  Date Value Range Status  02/03/2013 11.0* 12.0 - 15.0 g/dL Final     HCT  Date Value Range Status  02/03/2013 32.3* 36.0 - 46.0 % Final   Shannon Myers presented in spontaneous labor. She received one dose of fentanyl. Delivery was uncomplicated with stable mom and baby. Postpartum, she did fine. She wants to bottlefeed and wants Nexplanon for contraception.  Physical Exam:  General: alert, cooperative and no distress Lochia: appropriate Uterine Fundus: firm Incision: n/a DVT Evaluation: No evidence of DVT seen on physical exam.  Discharge Diagnoses: Term Pregnancy-delivered  Discharge Information: Date: 02/03/2013 Activity: pelvic rest Diet: routine Medications: PNV and Ibuprofen Condition: stable Instructions: refer to practice specific booklet Discharge to: home Follow-up Information   Follow up with [facility]. Schedule an appointment as soon as possible for a visit in 5 weeks. (Postpartum follow-up)    Specialty:  Obstetrics and Gynecology   Contact information:   [address] [place] [address] [phone]      Newborn Data: Live born female  Birth Weight: 6 lb 1.7 oz (2770 g) APGAR: 9, 9  Home with mother.  Shannon Myers 02/03/2013, 7:45 AM  I have seen and examined this patient and agree the above assessment. CRESENZO-DISHMAN,Burt Piatek 02/05/2013 10:37 PM

## 2013-02-15 ENCOUNTER — Encounter: Payer: Self-pay | Admitting: *Deleted

## 2013-03-07 ENCOUNTER — Encounter (HOSPITAL_COMMUNITY): Payer: Self-pay | Admitting: *Deleted

## 2013-03-09 ENCOUNTER — Ambulatory Visit: Payer: Medicaid - Out of State | Admitting: Advanced Practice Midwife

## 2013-03-09 ENCOUNTER — Encounter: Payer: Self-pay | Admitting: *Deleted

## 2013-03-09 ENCOUNTER — Encounter: Payer: Self-pay | Admitting: Advanced Practice Midwife

## 2013-03-09 DIAGNOSIS — Z23 Encounter for immunization: Secondary | ICD-10-CM

## 2013-03-09 MED ORDER — NORGESTIMATE-ETH ESTRADIOL 0.25-35 MG-MCG PO TABS: 1.0000 | ORAL_TABLET | Freq: Every day | ORAL | Status: DC

## 2013-03-09 NOTE — Progress Notes (Unsigned)
  Subjective:     Shannon Myers is a 27 y.o. female who presents for a postpartum visit. She is 5 weeks postpartum following a spontaneous vaginal delivery. I have fully reviewed the prenatal and intrapartum course. The delivery was at *** gestational weeks. Outcome: {delivery outcome:32078}. Anesthesia: {anesthesia types:812}. Postpartum course has been ***. Baby's course has been ***. Baby is feeding by {breast/bottle:69}. Bleeding {vag bleed:12292}. Bowel function is {normal:32111}. Bladder function is {normal:32111}. Patient {is/is not:9024} sexually active. Contraception method is {contraceptive method:5051}. Postpartum depression screening: {neg default:13464::"negative"}.  {Common ambulatory SmartLinks:19316}  Review of Systems {ros; complete:30496}   Objective:    BP 123/80  Pulse 75  Temp(Src) 98.6 F (37 C)  Wt 171 lb 8 oz (77.792 kg)  BMI 35.85 kg/m2  Breastfeeding? No  General:  {gen appearance:16600}   Breasts:  {breast exam:1202::"inspection negative, no nipple discharge or bleeding, no masses or nodularity palpable"}  Lungs: {lung exam:16931}  Heart:  {heart exam:5510}  Abdomen: {abdomen exam:16834}   Vulva:  {labia exam:12198}  Vagina: {vagina exam:12200}  Cervix:  {cervix exam:14595}  Corpus: {uterus exam:12215}  Adnexa:  {adnexa exam:12223}  Rectal Exam: {rectal/vaginal exam:12274}        Assessment:    *** postpartum exam. Pap smear {done:10129} at today's visit.   Plan:    1. Contraception: {method:5051} 2. *** 3. Follow up in: {1-10:13787} {time; units:19136} or as needed.

## 2014-04-02 ENCOUNTER — Encounter: Payer: Self-pay | Admitting: Advanced Practice Midwife

## 2014-04-05 ENCOUNTER — Other Ambulatory Visit (HOSPITAL_COMMUNITY): Payer: Self-pay | Admitting: Physician Assistant

## 2014-04-05 DIAGNOSIS — Z369 Encounter for antenatal screening, unspecified: Secondary | ICD-10-CM

## 2014-04-05 LAB — OB RESULTS CONSOLE HGB/HCT, BLOOD
HCT: 40 %
Hemoglobin: 13.4 g/dL

## 2014-04-05 LAB — OB RESULTS CONSOLE PLATELET COUNT: Platelets: 216 10*3/uL

## 2014-04-05 LAB — OB RESULTS CONSOLE GC/CHLAMYDIA
CHLAMYDIA, DNA PROBE: NEGATIVE
Gonorrhea: NEGATIVE

## 2014-04-05 LAB — OB RESULTS CONSOLE RPR: RPR: NONREACTIVE

## 2014-04-05 LAB — CYTOLOGY - PAP: PAP SMEAR: NEGATIVE

## 2014-04-05 LAB — OB RESULTS CONSOLE ABO/RH: RH Type: POSITIVE

## 2014-04-05 LAB — HEMOGLOBIN EVAL RFX ELECTROPHORESIS: HEMOGLOBIN EVALUATION: NORMAL

## 2014-04-05 LAB — OB RESULTS CONSOLE ANTIBODY SCREEN: ANTIBODY SCREEN: NEGATIVE

## 2014-04-05 LAB — OB RESULTS CONSOLE HIV ANTIBODY (ROUTINE TESTING): HIV: NONREACTIVE

## 2014-04-05 LAB — OB RESULTS CONSOLE RUBELLA ANTIBODY, IGM: RUBELLA: IMMUNE

## 2014-04-05 LAB — OB RESULTS CONSOLE HEPATITIS B SURFACE ANTIGEN: Hepatitis B Surface Ag: NEGATIVE

## 2014-04-06 ENCOUNTER — Ambulatory Visit (HOSPITAL_COMMUNITY): Payer: Self-pay

## 2014-04-24 ENCOUNTER — Encounter (HOSPITAL_COMMUNITY): Payer: Self-pay

## 2014-04-24 ENCOUNTER — Ambulatory Visit (HOSPITAL_COMMUNITY): Admission: RE | Admit: 2014-04-24 | Payer: Medicaid Other | Source: Ambulatory Visit

## 2014-04-24 ENCOUNTER — Ambulatory Visit (HOSPITAL_COMMUNITY)
Admission: RE | Admit: 2014-04-24 | Discharge: 2014-04-24 | Disposition: A | Discharge status: Home / Self Care | Payer: Medicaid Other | Source: Ambulatory Visit | Attending: Physician Assistant | Admitting: Physician Assistant

## 2014-04-24 ENCOUNTER — Other Ambulatory Visit (HOSPITAL_COMMUNITY): Payer: Self-pay | Admitting: Physician Assistant

## 2014-04-24 VITALS — BP 129/75 | HR 88 | Wt 169.0 lb

## 2014-04-24 DIAGNOSIS — O99213 Obesity complicating pregnancy, third trimester: Secondary | ICD-10-CM | POA: Insufficient documentation

## 2014-04-24 DIAGNOSIS — E669 Obesity, unspecified: Secondary | ICD-10-CM | POA: Insufficient documentation

## 2014-04-24 DIAGNOSIS — Z3A09 9 weeks gestation of pregnancy: Secondary | ICD-10-CM | POA: Insufficient documentation

## 2014-04-24 DIAGNOSIS — Z369 Encounter for antenatal screening, unspecified: Secondary | ICD-10-CM

## 2014-04-24 DIAGNOSIS — Z3689 Encounter for other specified antenatal screening: Secondary | ICD-10-CM | POA: Insufficient documentation

## 2014-04-24 DIAGNOSIS — O99211 Obesity complicating pregnancy, first trimester: Secondary | ICD-10-CM | POA: Insufficient documentation

## 2014-04-24 DIAGNOSIS — Z36 Encounter for antenatal screening of mother: Secondary | ICD-10-CM | POA: Insufficient documentation

## 2014-05-01 ENCOUNTER — Inpatient Hospital Stay (HOSPITAL_COMMUNITY)
Admission: AD | Admit: 2014-05-01 | Discharge: 2014-05-01 | Disposition: A | Discharge status: Home / Self Care | Payer: Medicaid Other | Source: Ambulatory Visit | Attending: Obstetrics and Gynecology | Admitting: Obstetrics and Gynecology

## 2014-05-01 ENCOUNTER — Encounter (HOSPITAL_COMMUNITY): Payer: Self-pay | Admitting: *Deleted

## 2014-05-01 DIAGNOSIS — O99891 Other specified diseases and conditions complicating pregnancy: Secondary | ICD-10-CM

## 2014-05-01 DIAGNOSIS — O26891 Other specified pregnancy related conditions, first trimester: Secondary | ICD-10-CM

## 2014-05-01 DIAGNOSIS — M545 Low back pain: Secondary | ICD-10-CM | POA: Insufficient documentation

## 2014-05-01 DIAGNOSIS — O9989 Other specified diseases and conditions complicating pregnancy, childbirth and the puerperium: Secondary | ICD-10-CM | POA: Insufficient documentation

## 2014-05-01 DIAGNOSIS — Z3A10 10 weeks gestation of pregnancy: Secondary | ICD-10-CM | POA: Diagnosis not present

## 2014-05-01 DIAGNOSIS — M549 Dorsalgia, unspecified: Secondary | ICD-10-CM

## 2014-05-01 DIAGNOSIS — R197 Diarrhea, unspecified: Secondary | ICD-10-CM | POA: Diagnosis not present

## 2014-05-01 LAB — URINALYSIS, ROUTINE W REFLEX MICROSCOPIC
BILIRUBIN URINE: NEGATIVE
Glucose, UA: NEGATIVE mg/dL
Hgb urine dipstick: NEGATIVE
KETONES UR: NEGATIVE mg/dL
Nitrite: NEGATIVE
Protein, ur: NEGATIVE mg/dL
Specific Gravity, Urine: 1.01 (ref 1.005–1.030)
UROBILINOGEN UA: 1 mg/dL (ref 0.0–1.0)
pH: 7 (ref 5.0–8.0)

## 2014-05-01 LAB — URINE MICROSCOPIC-ADD ON

## 2014-05-01 MED ORDER — ACETAMINOPHEN 500 MG PO TABS
1000.0000 mg | ORAL_TABLET | Freq: Once | ORAL | Status: AC
  Administered 2014-05-01: 1000 mg via ORAL
  Filled 2014-05-01: qty 2

## 2014-05-01 MED ORDER — CYCLOBENZAPRINE HCL 10 MG PO TABS: 10.0000 mg | ORAL_TABLET | Freq: Two times a day (BID) | ORAL | Status: DC | PRN

## 2014-05-01 MED ORDER — CYCLOBENZAPRINE HCL 10 MG PO TABS
10.0000 mg | ORAL_TABLET | Freq: Once | ORAL | Status: AC
  Administered 2014-05-01: 10 mg via ORAL
  Filled 2014-05-01: qty 1

## 2014-05-01 MED ORDER — OXYCODONE-ACETAMINOPHEN 5-325 MG PO TABS: 1.0000 | ORAL_TABLET | Freq: Once | ORAL | Status: DC

## 2014-05-01 NOTE — MAU Note (Signed)
Patient states she has had right flank pain for about one month. Denies vaginal bleeding, does have nausea and a headache. States she has had diarrhea with all stools for about one month.

## 2014-05-01 NOTE — MAU Provider Note (Signed)
Chief Complaint: Flank Pain; Headache; Nausea; and Diarrhea   None    SUBJECTIVE HPI: Shannon Myers is a 28 y.o. [id number] at 6520w0d by LMP who presents with right lower back pain. Pt has been taking tylenol with improvement of pain.  Pt has had pain for 1 month but worse over the past 3 to 4 days.  Pt denies UTI symptoms, spotting or vaginal discharge,  Pt has had diarrhea but not over the past 2 days.  Pt denies fever. Pt has seen unlicensed chiropractor for the pain. Pt has US at [facility] on 04/24/2014 which was normal. [facility]  Patient states she has had right flank pain for about one month. Denies vaginal bleeding, does have nausea and a headache. States she has had diarrhea with all stools for about one month  Past Medical History  Diagnosis Date  . Preterm labor   . Anemia   . Vaginal bleeding in pregnancy     Last pregnancy   OB History  Gravida Para Term Preterm AB SAB TAB Ectopic Multiple Living  6 5 4 1      4     # Outcome Date GA Lbr Len/2nd Weight Sex Delivery Anes PTL Lv  6 Current           5 Term 02/02/13 7341w6d 07:32 / 00:26 2.77 kg (6 lb 1.7 oz) F Vag-Spont None  Y  4 Term 03/11/12 1173w0d   Judie PetitM Vag-Spont EPI  Y  3 Term 07/2006 3864w0d   M Vag-Spont EPI  Y  2 Preterm 09/2003 5936w0d   Judie PetitM Vag-Spont EPI Y Y     Comments: Baby was in NICU  1 Term              Past Surgical History  Procedure Laterality Date  . No past surgeries     History   Social History  . Marital Status: Married    Spouse Name: N/A    Number of Children: N/A  . Years of Education: N/A   Occupational History  . Not on file.   Social History Main Topics  . Smoking status: Never Smoker   . Smokeless tobacco: Never Used  . Alcohol Use: No  . Drug Use: No  . Sexual Activity: Yes   Other Topics Concern  . Not on file   Social History Narrative   No current facility-administered medications on file prior to encounter.   Current Outpatient  Prescriptions on File Prior to Encounter  Medication Sig Dispense Refill  . Prenatal Vit-Fe Fumarate-FA (PRENATAL MULTIVITAMIN) TABS tablet Take 1 tablet by mouth daily. 30 tablet 2  . ibuprofen (ADVIL,MOTRIN) 600 MG tablet Take 1 tablet (600 mg total) by mouth every 6 (six) hours as needed for pain. 30 tablet 0  . norgestimate-ethinyl estradiol (ORTHO-CYCLEN,SPRINTEC,PREVIFEM) 0.25-35 MG-MCG tablet Take 1 tablet by mouth daily. (Patient not taking: Reported on 04/24/2014) 1 Package 11   No Known Allergies  ROS: Pertinent items in HPI  OBJECTIVE Blood pressure 120/69, pulse 82, temperature 98.1 F (36.7 C), temperature source Oral, resp. rate 16, height 4' 10.5" (1.486 m), weight 77.747 kg (171 lb 6.4 oz), last menstrual period 01/24/2014, SpO2 98 %, not currently breastfeeding. GENERAL: Well-developed, well-nourished female in no acute distress.  HEENT: Normocephalic HEART: normal rate RESP: normal effort; no CVA tenderness  ABDOMEN: Soft, non-tender Lower right back tenderness above hip EXTREMITIES: Nontender, no edema NEURO: Alert and oriented Pelvic deferred   LAB RESULTS Results  for orders placed or performed during the hospital encounter of 05/01/14 (from the past 24 hour(s))  Urinalysis, Routine w reflex microscopic     Status: Abnormal   Collection Time: 05/01/14  4:35 PM  Result Value Ref Range   Color, Urine YELLOW YELLOW   APPearance CLEAR CLEAR   Specific Gravity, Urine 1.010 1.005 - 1.030   pH 7.0 5.0 - 8.0   Glucose, UA NEGATIVE NEGATIVE mg/dL   Hgb urine dipstick NEGATIVE NEGATIVE   Bilirubin Urine NEGATIVE NEGATIVE   Ketones, ur NEGATIVE NEGATIVE mg/dL   Protein, ur NEGATIVE NEGATIVE mg/dL   Urobilinogen, UA 1.0 0.0 - 1.0 mg/dL   Nitrite NEGATIVE NEGATIVE   Leukocytes, UA MODERATE (A) NEGATIVE  Urine microscopic-add on     Status: Abnormal   Collection Time: 05/01/14  4:35 PM  Result Value Ref Range   Squamous Epithelial / LPF FEW (A) RARE   WBC, UA 0-2 <3  WBC/hpf    IMAGING [facility] Ob Comp Less 14 Wks  04/24/2014   OBSTETRICAL ULTRASOUND: This exam was performed within a [facility]. The OB US report was generated in the AS system, and faxed to the ordering physician.   This report is available in the [employer] PACS. See the AS Obstetric US report via the Image Link.   MAU COURSE  ASSESSMENT ain  Back pain Discharge home RX Flexeril; back exercises    Medication List    ASK your doctor about these medications        acetaminophen 500 MG tablet  Commonly known as:  TYLENOL  Take 1,000 mg by mouth every 6 (six) hours as needed for moderate pain or headache.     ibuprofen 600 MG tablet  Commonly known as:  ADVIL,MOTRIN  Take 1 tablet (600 mg total) by mouth every 6 (six) hours as needed for pain.     norgestimate-ethinyl estradiol 0.25-35 MG-MCG tablet  Commonly known as:  ORTHO-CYCLEN,SPRINTEC,PREVIFEM  Take 1 tablet by mouth daily.     OVER THE COUNTER MEDICATION  Apply 1 patch topically as needed (back pain). Over the counter patch for pain.     prenatal multivitamin Tabs tablet  Take 1 tablet by mouth daily.      Tylenol 1000mg  given Flexeril 10mg  Heat pack Pain still rated as 10 IMP- back pain in pregnancy Tylenol Rx for flexeril #30 Back exercises abd binder F/u with OB apponitment  Dorathy KinsmanVirginia Smith, CNM 05/01/2014  7:05 PM

## 2014-05-01 NOTE — Progress Notes (Signed)
Pt states she has a headache that she rates 5 and she has been taking tylenol which helps with the pain

## 2014-05-03 ENCOUNTER — Other Ambulatory Visit (HOSPITAL_COMMUNITY): Payer: Self-pay | Admitting: Physician Assistant

## 2014-05-03 DIAGNOSIS — Z3682 Encounter for antenatal screening for nuchal translucency: Secondary | ICD-10-CM

## 2014-05-10 ENCOUNTER — Other Ambulatory Visit (HOSPITAL_COMMUNITY): Payer: Self-pay | Admitting: Physician Assistant

## 2014-05-10 DIAGNOSIS — IMO0002 Reserved for concepts with insufficient information to code with codable children: Secondary | ICD-10-CM

## 2014-05-10 DIAGNOSIS — Z0489 Encounter for examination and observation for other specified reasons: Secondary | ICD-10-CM

## 2014-05-17 ENCOUNTER — Encounter (HOSPITAL_COMMUNITY): Payer: Self-pay

## 2014-05-17 ENCOUNTER — Ambulatory Visit (HOSPITAL_COMMUNITY)
Admission: RE | Admit: 2014-05-17 | Discharge: 2014-05-17 | Disposition: A | Discharge status: Home / Self Care | Payer: Medicaid Other | Source: Ambulatory Visit | Attending: Physician Assistant | Admitting: Physician Assistant

## 2014-05-17 DIAGNOSIS — Z36 Encounter for antenatal screening of mother: Secondary | ICD-10-CM | POA: Diagnosis not present

## 2014-05-17 DIAGNOSIS — Z3682 Encounter for antenatal screening for nuchal translucency: Secondary | ICD-10-CM

## 2014-05-18 DIAGNOSIS — Z3682 Encounter for antenatal screening for nuchal translucency: Secondary | ICD-10-CM | POA: Insufficient documentation

## 2014-05-18 DIAGNOSIS — Z3A12 12 weeks gestation of pregnancy: Secondary | ICD-10-CM | POA: Insufficient documentation

## 2014-05-28 ENCOUNTER — Other Ambulatory Visit (HOSPITAL_COMMUNITY): Payer: Self-pay

## 2014-06-01 NOTE — L&D Delivery Note (Signed)
Patient is 29 y.o. [id number] [redacted]w[redacted]d admitted in advance active labor.  Delivery Note At 8:43 AM a viable female was delivered via Vaginal, Spontaneous Delivery. (Presentation: Unknown).  APGAR: 8, 9; weight 6 lb 7.7 oz (2940 g). Placenta status: Intact, Spontaneous.  Cord: 3 vessels with the following complications: None.    Anesthesia: None  Episiotomy: None Lacerations: None Est. Blood Loss (mL): 100  Upon arrival patient was being wheeled down the hallway by EMS. She was complete and pushing. Patient had precipitous delivery and delivered in hallway on way to L&D room. She had SROM at time of delivery. Upon arrival to room baby was noted to have good tone and place on maternal abdomen for drying and stimulation. Delayed cord clamping performed. Placenta delivered intact with 3V cord. Vaginal canal and perineum was inspected and no lacerations appreciated; hemostatic. Pitocin IM was delivered as patient had no IV access. Uterus massaged until bleeding slowed.   Mom to postpartum.  Baby to Couplet care / Skin to Skin.  Caryl Ada, DO 11/12/2014, 9:49 AM PGY-1, [facility]

## 2014-06-19 ENCOUNTER — Ambulatory Visit (HOSPITAL_COMMUNITY): Admission: RE | Admit: 2014-06-19 | Payer: Self-pay | Source: Ambulatory Visit

## 2014-07-02 ENCOUNTER — Ambulatory Visit (HOSPITAL_COMMUNITY)
Admission: RE | Admit: 2014-07-02 | Discharge: 2014-07-02 | Disposition: A | Discharge status: Home / Self Care | Payer: Self-pay | Source: Ambulatory Visit | Attending: Physician Assistant | Admitting: Physician Assistant

## 2014-07-02 ENCOUNTER — Other Ambulatory Visit (HOSPITAL_COMMUNITY): Payer: Self-pay | Admitting: Physician Assistant

## 2014-07-02 DIAGNOSIS — Z3689 Encounter for other specified antenatal screening: Secondary | ICD-10-CM | POA: Insufficient documentation

## 2014-07-02 DIAGNOSIS — O99212 Obesity complicating pregnancy, second trimester: Secondary | ICD-10-CM | POA: Insufficient documentation

## 2014-07-02 DIAGNOSIS — Z0489 Encounter for examination and observation for other specified reasons: Secondary | ICD-10-CM

## 2014-07-02 DIAGNOSIS — O09212 Supervision of pregnancy with history of pre-term labor, second trimester: Secondary | ICD-10-CM | POA: Insufficient documentation

## 2014-07-02 DIAGNOSIS — IMO0002 Reserved for concepts with insufficient information to code with codable children: Secondary | ICD-10-CM

## 2014-07-02 DIAGNOSIS — Z3A18 18 weeks gestation of pregnancy: Secondary | ICD-10-CM | POA: Insufficient documentation

## 2014-07-23 ENCOUNTER — Other Ambulatory Visit (HOSPITAL_COMMUNITY): Payer: Self-pay | Admitting: Urology

## 2014-07-23 DIAGNOSIS — Z0489 Encounter for examination and observation for other specified reasons: Secondary | ICD-10-CM

## 2014-07-23 DIAGNOSIS — IMO0002 Reserved for concepts with insufficient information to code with codable children: Secondary | ICD-10-CM

## 2014-07-30 ENCOUNTER — Ambulatory Visit (HOSPITAL_COMMUNITY): Admission: RE | Admit: 2014-07-30 | Payer: Self-pay | Source: Ambulatory Visit

## 2014-08-24 ENCOUNTER — Ambulatory Visit (HOSPITAL_COMMUNITY)
Admission: RE | Admit: 2014-08-24 | Discharge: 2014-08-24 | Disposition: A | Discharge status: Home / Self Care | Payer: Self-pay | Source: Ambulatory Visit | Attending: Urology | Admitting: Urology

## 2014-08-24 DIAGNOSIS — IMO0002 Reserved for concepts with insufficient information to code with codable children: Secondary | ICD-10-CM | POA: Insufficient documentation

## 2014-08-24 DIAGNOSIS — Z3A26 26 weeks gestation of pregnancy: Secondary | ICD-10-CM | POA: Insufficient documentation

## 2014-08-24 DIAGNOSIS — Z36 Encounter for antenatal screening of mother: Secondary | ICD-10-CM | POA: Insufficient documentation

## 2014-08-24 DIAGNOSIS — Z0489 Encounter for examination and observation for other specified reasons: Secondary | ICD-10-CM | POA: Insufficient documentation

## 2014-09-24 ENCOUNTER — Other Ambulatory Visit (HOSPITAL_COMMUNITY): Payer: Self-pay | Admitting: Urology

## 2014-09-24 DIAGNOSIS — O3663X0 Maternal care for excessive fetal growth, third trimester, not applicable or unspecified: Secondary | ICD-10-CM

## 2014-09-27 ENCOUNTER — Ambulatory Visit (HOSPITAL_COMMUNITY)
Admission: RE | Admit: 2014-09-27 | Discharge: 2014-09-27 | Disposition: A | Discharge status: Home / Self Care | Payer: Self-pay | Source: Ambulatory Visit | Attending: Urology | Admitting: Urology

## 2014-09-27 DIAGNOSIS — O3663X0 Maternal care for excessive fetal growth, third trimester, not applicable or unspecified: Secondary | ICD-10-CM

## 2014-09-27 DIAGNOSIS — Z3A31 31 weeks gestation of pregnancy: Secondary | ICD-10-CM | POA: Insufficient documentation

## 2014-09-27 DIAGNOSIS — O26843 Uterine size-date discrepancy, third trimester: Secondary | ICD-10-CM | POA: Insufficient documentation

## 2014-09-27 DIAGNOSIS — Z6831 Body mass index (BMI) 31.0-31.9, adult: Secondary | ICD-10-CM | POA: Insufficient documentation

## 2014-10-22 ENCOUNTER — Inpatient Hospital Stay (HOSPITAL_COMMUNITY)
Admission: AD | Admit: 2014-10-22 | Discharge: 2014-10-23 | DRG: 778 | Disposition: A | Discharge status: Home / Self Care | Payer: Medicaid Other | Source: Ambulatory Visit | Attending: Family Medicine | Admitting: Family Medicine

## 2014-10-22 ENCOUNTER — Encounter (HOSPITAL_COMMUNITY): Payer: Self-pay | Admitting: *Deleted

## 2014-10-22 DIAGNOSIS — O4700 False labor before 37 completed weeks of gestation, unspecified trimester: Secondary | ICD-10-CM | POA: Diagnosis present

## 2014-10-22 DIAGNOSIS — Z3A34 34 weeks gestation of pregnancy: Secondary | ICD-10-CM | POA: Diagnosis present

## 2014-10-22 DIAGNOSIS — O4703 False labor before 37 completed weeks of gestation, third trimester: Secondary | ICD-10-CM

## 2014-10-22 LAB — CBC
HCT: 35.1 % — ABNORMAL LOW (ref 36.0–46.0)
Hemoglobin: 12.1 g/dL (ref 12.0–15.0)
MCH: 28.1 pg (ref 26.0–34.0)
MCHC: 34.5 g/dL (ref 30.0–36.0)
MCV: 81.4 fL (ref 78.0–100.0)
Platelets: 181 10*3/uL (ref 150–400)
RBC: 4.31 MIL/uL (ref 3.87–5.11)
RDW: 14.6 % (ref 11.5–15.5)
WBC: 11.5 10*3/uL — ABNORMAL HIGH (ref 4.0–10.5)

## 2014-10-22 LAB — TYPE AND SCREEN
ABO/RH(D): O POS
Antibody Screen: NEGATIVE

## 2014-10-22 LAB — OB RESULTS CONSOLE HIV ANTIBODY (ROUTINE TESTING): HIV: NONREACTIVE

## 2014-10-22 LAB — OB RESULTS CONSOLE GBS: GBS: NEGATIVE

## 2014-10-22 MED ORDER — CALCIUM CARBONATE ANTACID 500 MG PO CHEW: 2.0000 | CHEWABLE_TABLET | ORAL | Status: DC | PRN

## 2014-10-22 MED ORDER — DOCUSATE SODIUM 100 MG PO CAPS
100.0000 mg | ORAL_CAPSULE | Freq: Every day | ORAL | Status: DC
  Administered 2014-10-23: 100 mg via ORAL
  Filled 2014-10-22: qty 1

## 2014-10-22 MED ORDER — PRENATAL MULTIVITAMIN CH
1.0000 | ORAL_TABLET | Freq: Every day | ORAL | Status: DC
  Administered 2014-10-23: 1 via ORAL
  Filled 2014-10-22: qty 1

## 2014-10-22 MED ORDER — BETAMETHASONE SOD PHOS & ACET 6 (3-3) MG/ML IJ SUSP
12.0000 mg | INTRAMUSCULAR | Status: AC
  Administered 2014-10-23: 12 mg via INTRAMUSCULAR
  Filled 2014-10-22: qty 2

## 2014-10-22 MED ORDER — LACTATED RINGERS IV SOLN
INTRAVENOUS | Status: DC
  Administered 2014-10-22: 21:00:00 via INTRAVENOUS

## 2014-10-22 MED ORDER — ZOLPIDEM TARTRATE 5 MG PO TABS: 5.0000 mg | ORAL_TABLET | Freq: Every evening | ORAL | Status: DC | PRN

## 2014-10-22 MED ORDER — BETAMETHASONE SOD PHOS & ACET 6 (3-3) MG/ML IJ SUSP
12.0000 mg | Freq: Once | INTRAMUSCULAR | Status: AC
  Administered 2014-10-22: 12 mg via INTRAMUSCULAR
  Filled 2014-10-22: qty 2

## 2014-10-22 MED ORDER — ACETAMINOPHEN 500 MG PO TABS
1000.0000 mg | ORAL_TABLET | Freq: Three times a day (TID) | ORAL | Status: DC | PRN
  Administered 2014-10-22: 1000 mg via ORAL
  Filled 2014-10-22: qty 2

## 2014-10-22 NOTE — Progress Notes (Signed)
I assisted Dee,Rn with questions. Kipp LaurenceEda  H Royal  Interpreter
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
## 2014-10-22 NOTE — Progress Notes (Signed)
I assisted Dr Ane PaymentMcEachern with some questions, also I assisted the RN in  labor and Delivery with questions, I ordered her dinner, by Orlan LeavensViria Alvarez Spanish Interpreter

## 2014-10-23 LAB — URINALYSIS, ROUTINE W REFLEX MICROSCOPIC
BILIRUBIN URINE: NEGATIVE
GLUCOSE, UA: NEGATIVE mg/dL
Hgb urine dipstick: NEGATIVE
Ketones, ur: NEGATIVE mg/dL
Nitrite: NEGATIVE
PH: 6 (ref 5.0–8.0)
Protein, ur: NEGATIVE mg/dL
Specific Gravity, Urine: <1.005 — ABNORMAL LOW (ref 1.005–1.030)
UROBILINOGEN UA: 0.2 mg/dL (ref 0.0–1.0)

## 2014-10-23 LAB — URINE MICROSCOPIC-ADD ON

## 2014-10-23 MED ORDER — CYCLOBENZAPRINE HCL 10 MG PO TABS
10.0000 mg | ORAL_TABLET | Freq: Two times a day (BID) | ORAL | Status: DC | PRN
  Filled 2014-10-23: qty 1

## 2014-10-23 MED ORDER — ACETAMINOPHEN 500 MG PO TABS: 1000.0000 mg | ORAL_TABLET | Freq: Four times a day (QID) | ORAL | Status: DC | PRN

## 2014-10-23 MED ORDER — CEPHALEXIN 250 MG PO CAPS
250.0000 mg | ORAL_CAPSULE | Freq: Four times a day (QID) | ORAL | Status: DC
Start: 2014-10-23 — End: 2014-11-13

## 2014-10-23 NOTE — Progress Notes (Signed)
I assisted RN and Dr Doroteo GlassmanPhelps with some instructions to go home, by Orlan LeavensViria Alvarez Spanish Interpreter

## 2014-10-23 NOTE — OB Triage Note (Signed)
Patient discharged home with interpreter utilized.  Instructed about use of new Keflex order based on UA, how to take and where to pick up at pharmacy.  Pt verbalized understanding.  Patient educated about signs and symptoms of PTL, Fetal kick counts, leaking of fluid or blood.  No questions at this time.  Patient high risk clinic Follow up appt is June 5.

## 2014-10-23 NOTE — Progress Notes (Signed)
I stopped to check on patients need I ordered her dinner, by Viria Alvarez Spanish Interpreter °

## 2014-10-23 NOTE — Discharge Instructions (Signed)
Informacin sobre el parto prematuro  (Preterm Labor Information) El parto prematuro comienza antes de la semana 37 de embarazo. La duracin de un embarazo normal es de 39 a 41 semanas.  CAUSAS  Generalmente no hay una causa que pueda identificarse del motivo por el que una mujer comienza un trabajo de parto prematuro. Sin embargo, una de las causas conocidas ms frecuentes son las infecciones. Las infecciones del tero, el cuello, la vagina, el lquido amnitico, la vejiga, los riones y hasta de los pulmones (neumona) pueden hacer que el trabajo de parto se inicie. Otras causas que pueden sospecharse son:   Infecciones urogenitales, como infecciones por hongos y vaginosis bacteriana.   Anormalidades uterinas (forma del tero, sptum uterino, fibromas, hemorragias en la placenta).   Un cuello que ha sido operado (puede ser que no permanezca cerrado).   Malformaciones del feto.   Gestaciones mltiples (mellizos, trillizos y ms).   Ruptura del saco amnitico.  FACTORES DE RIESGO   Historia previa de parto prematuro.   Tener ruptura prematura de las membranas (RPM).   La placenta cubre la abertura del cuello (placenta previa).   La placenta se separa del tero (abrupcin placentaria).   El cuello es demasiado dbil para contener al beb en el tero (cuello incompetente).   Hay mucho lquido en el saco amnitico (polihidramnios).   Consumo de drogas o hbito de fumar durante el embarazo.   No aumentar de peso lo suficiente durante el embarazo.   Mujeres menores de 18 aos o mayores de 35 aos.   Nivel socioeconmico bajo.   Pertenecer a la raza afroamericana. SNTOMAS  Los signos y sntomas del trabajo de parto prematuro son:   Clicos similares a los menstruales, dolor abdominal o dolor de espalda.  Contracciones uterinas regulares, tan frecuentes como seis por hora, sin importar su intensidad (pueden ser suaves o dolorosas).  Contracciones que comienzan  en la parte superior del tero y se expanden hacia abajo, a la zona inferior del abdomen y la espalda.   Sensacin de aumento de presin en la pelvis.   Aparece una secrecin acuosa o sanguinolenta por la vagina.  TRATAMIENTO  Segn el tiempo del embarazo y otras circunstancias, el mdico puede indicar reposo en cama. Si es necesario, le indicarn medicamentos para detener las contracciones y para madurar los pulmones del feto. Si el trabajo de parto se inicia antes de las 34 semanas de embarazo, se recomienda la hospitalizacin. El tratamiento depende de las condiciones en que se encuentren usted y el feto.  QU DEBE HACER SI PIENSA QUE EST EN TRABAJO DE PARTO PREMATURO?  Comunquese con su mdico inmediatamente. Debe concurrir al hospital para ser controlada inmediatamente.  CMO PUEDE EVITAR EL TRABAJO DE PARTO PREMATURO EN FUTUROS EMBARAZOS?  Usted debe:   Si fuma, abandonar el hbito.  Mantener un peso saludable y evitar sustancias qumicas y drogas innecesarias.  Controlar todo tipo de infeccin.  Informe a su mdico si tiene una historia conocida de trabajo de parto prematuro. Document Released: 08/25/2007 Document Revised: 01/18/2013 Exit[employer] Patient Information 2015 [employer]. This information is not intended to replace advice given to you by your health care provider. Make sure you discuss any questions you have with your health care provider.  

## 2014-10-24 ENCOUNTER — Other Ambulatory Visit (HOSPITAL_COMMUNITY): Payer: Self-pay | Admitting: Obstetrics & Gynecology

## 2014-10-24 LAB — URINE CULTURE: Colony Count: 6000
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
## 2014-11-05 ENCOUNTER — Ambulatory Visit (INDEPENDENT_AMBULATORY_CARE_PROVIDER_SITE_OTHER): Payer: Self-pay | Admitting: Obstetrics and Gynecology

## 2014-11-05 ENCOUNTER — Encounter: Payer: Self-pay | Admitting: Obstetrics and Gynecology

## 2014-11-05 VITALS — BP 114/66 | HR 86 | Temp 98.3°F | Wt 186.3 lb

## 2014-11-05 DIAGNOSIS — O368131 Decreased fetal movements, third trimester, fetus 1: Secondary | ICD-10-CM

## 2014-11-05 DIAGNOSIS — O09213 Supervision of pregnancy with history of pre-term labor, third trimester: Secondary | ICD-10-CM

## 2014-11-05 DIAGNOSIS — O0933 Supervision of pregnancy with insufficient antenatal care, third trimester: Secondary | ICD-10-CM

## 2014-11-05 DIAGNOSIS — O0993 Supervision of high risk pregnancy, unspecified, third trimester: Secondary | ICD-10-CM

## 2014-11-05 LAB — POCT URINALYSIS DIP (DEVICE)
Bilirubin Urine: NEGATIVE
GLUCOSE, UA: NEGATIVE mg/dL
HGB URINE DIPSTICK: NEGATIVE
Ketones, ur: NEGATIVE mg/dL
Leukocytes, UA: NEGATIVE
Nitrite: NEGATIVE
PROTEIN: NEGATIVE mg/dL
Specific Gravity, Urine: <=1.005 (ref 1.005–1.030)
Urobilinogen, UA: 0.2 mg/dL (ref 0.0–1.0)
pH: 5.5 (ref 5.0–8.0)

## 2014-11-05 NOTE — Progress Notes (Signed)
Used Interpreter [employer]Blanca Myers . C/o contractions everyday about every 2 hours and when standing also c/o cramps. States since last week Monday feels like baby moving less- now feels baby move every 2-3 hours, states before felt baby very often. Given prenatal education booklets.

## 2014-11-05 NOTE — Progress Notes (Signed)
Pt felt good FM during NST - discussed normal patterns of FM for this EGA. Interpreter Elna Breslowarol Hernandez.

## 2014-11-05 NOTE — Progress Notes (Signed)
Nutrition note: 1st visit consult Pt has h/o obesity. Pt has gained 26.3# @ 9857w6d, which is > expected. Pt reports taking a PNV. Pt reports no N&V but has some heartburn. NKFA. Pt received verbal & written education on general nutrition during pregnancy in Spanish via an interpreter. Provided & discussed "How long should I breastfeed my baby?" handout; encouraged exclusive BF for as long as she can (goal of 3435m) & continue to BF for 1-2 years.  Discussed wt gain goals of 11-20# or 0.5#/wk. Pt agrees to continue taking a PNV. Pt has WIC & plans to BF. F/u as needed Blondell RevealLaura Teasia Zapf, MS, RD, LDN

## 2014-11-05 NOTE — Addendum Note (Signed)
Addended by: Jill SideAY, Linde Wilensky L on: 11/05/2014 10:21 AM   Modules accepted: Orders

## 2014-11-05 NOTE — Progress Notes (Signed)
NST reactive.

## 2014-11-05 NOTE — Addendum Note (Signed)
Addended by: Candelaria StagersHAIZLIP, Livian Vanderbeck E on: 11/05/2014 10:33 AM   Modules accepted: Orders

## 2014-11-05 NOTE — Progress Notes (Signed)
Subjective:   Shannon Myers is a 29 y.o. [phone] at 6240w6d being seen today for her obstetrical visit.  Patient reports no complaints.   Contractions: Contractions: Irregular.   Vaginal Bleeding Vag. Bleeding: None.   Fetal Movement: Movement: Present but less than previously over the last week.   Denies regular contractions, vaginal bleeding or leaking of fluid.    The following portions of the patient's history were reviewed and updated as appropriate: allergies, current medications, past family history, past medical history, past social history, past surgical history and problem list.   Objective:  BP 114/66 mmHg  Pulse 86  Temp(Src) 98.3 F (36.8 C)  Wt 186 lb 4.8 oz (84.505 kg)  LMP 01/24/2014 Fetal Heart Rate: Fetal Heart Rate (bpm): 140  Fundal Height: Fundal Height: 37 cm  Fetal Movement: Movement: Present  Fetal Presentation:     Abdomen: Soft, gravid, appropriate for gestational age.  Pain/Pressure: Pain/Pressure: Present     Vaginal: Vaginal Bleeding Vag. Bleeding: None   Discharge: Vag D/C Character: Yellow  Cervix: Dilation: Dilation: 2 Effacement: Effacement (%): 20 Station: Station: -3  Extremities: Edema: Edema: None   Urinalysis: Protein:   Glucose:   No results found for this or any previous visit (from the past 24 hour(s)).   Assessment and Plan:   Pregnancy:  [id number] at 6940w6d doing well today.   1. History of preterm delivery. Unclear history although was not on 17P during this pregnancy. Now too late.  2. Threatened preterm labor in this pregnancy. Cervical exam stable today.  3. Decreased fetal movement. NST today. 4. Routine PNC. GBS negative. GC/CT today.   Preterm labor symptoms: vaginal bleeding, contractions and leaking of fluid reviewed in detail.  Fetal movement precautions reviewed.  Follow up in 1 week.   William DaltonMorgan Herminio Kniskern, MD

## 2014-11-06 LAB — GC/CHLAMYDIA PROBE AMP
CT Probe RNA: NEGATIVE
GC PROBE AMP APTIMA: NEGATIVE

## 2014-11-07 ENCOUNTER — Encounter: Payer: Self-pay | Admitting: *Deleted

## 2014-11-10 ENCOUNTER — Encounter: Payer: Self-pay | Admitting: Obstetrics and Gynecology

## 2014-11-10 DIAGNOSIS — O0993 Supervision of high risk pregnancy, unspecified, third trimester: Secondary | ICD-10-CM | POA: Insufficient documentation

## 2014-11-12 ENCOUNTER — Encounter: Payer: Self-pay | Admitting: Obstetrics and Gynecology

## 2014-11-12 ENCOUNTER — Inpatient Hospital Stay (HOSPITAL_COMMUNITY)
Admission: AD | Admit: 2014-11-12 | Discharge: 2014-11-13 | DRG: 775 | Disposition: A | Discharge status: Home / Self Care | Payer: Medicaid Other | Source: Ambulatory Visit | Attending: Obstetrics and Gynecology | Admitting: Obstetrics and Gynecology

## 2014-11-12 ENCOUNTER — Encounter (HOSPITAL_COMMUNITY): Payer: Self-pay | Admitting: *Deleted

## 2014-11-12 DIAGNOSIS — Z3A37 37 weeks gestation of pregnancy: Secondary | ICD-10-CM | POA: Diagnosis present

## 2014-11-12 DIAGNOSIS — O0993 Supervision of high risk pregnancy, unspecified, third trimester: Secondary | ICD-10-CM

## 2014-11-12 DIAGNOSIS — IMO0001 Reserved for inherently not codable concepts without codable children: Secondary | ICD-10-CM

## 2014-11-12 DIAGNOSIS — O0933 Supervision of pregnancy with insufficient antenatal care, third trimester: Secondary | ICD-10-CM | POA: Diagnosis not present

## 2014-11-12 LAB — CBC
HCT: 36.7 % (ref 36.0–46.0)
Hemoglobin: 12.5 g/dL (ref 12.0–15.0)
MCH: 27.7 pg (ref 26.0–34.0)
MCHC: 34.1 g/dL (ref 30.0–36.0)
MCV: 81.2 fL (ref 78.0–100.0)
Platelets: 167 10*3/uL (ref 150–400)
RBC: 4.52 MIL/uL (ref 3.87–5.11)
RDW: 14.7 % (ref 11.5–15.5)
WBC: 11.2 10*3/uL — AB (ref 4.0–10.5)

## 2014-11-12 LAB — TYPE AND SCREEN
ABO/RH(D): O POS
ANTIBODY SCREEN: NEGATIVE

## 2014-11-12 MED ORDER — ONDANSETRON HCL 4 MG PO TABS: 4.0000 mg | ORAL_TABLET | ORAL | Status: DC | PRN

## 2014-11-12 MED ORDER — PRENATAL MULTIVITAMIN CH
1.0000 | ORAL_TABLET | Freq: Every day | ORAL | Status: DC
  Administered 2014-11-13: 1 via ORAL
  Filled 2014-11-12: qty 1

## 2014-11-12 MED ORDER — SENNOSIDES-DOCUSATE SODIUM 8.6-50 MG PO TABS
2.0000 | ORAL_TABLET | ORAL | Status: DC
  Administered 2014-11-12: 2 via ORAL
  Filled 2014-11-12: qty 2

## 2014-11-12 MED ORDER — WITCH HAZEL-GLYCERIN EX PADS: 1.0000 "application " | MEDICATED_PAD | CUTANEOUS | Status: DC | PRN

## 2014-11-12 MED ORDER — ZOLPIDEM TARTRATE 5 MG PO TABS: 5.0000 mg | ORAL_TABLET | Freq: Every evening | ORAL | Status: DC | PRN

## 2014-11-12 MED ORDER — OXYTOCIN 10 UNIT/ML IJ SOLN
10.0000 [IU] | Freq: Once | INTRAMUSCULAR | Status: AC
  Administered 2014-11-12: 10 [IU] via INTRAMUSCULAR

## 2014-11-12 MED ORDER — OXYCODONE-ACETAMINOPHEN 5-325 MG PO TABS: 1.0000 | ORAL_TABLET | ORAL | Status: DC | PRN

## 2014-11-12 MED ORDER — ONDANSETRON HCL 4 MG/2ML IJ SOLN: 4.0000 mg | Freq: Four times a day (QID) | INTRAMUSCULAR | Status: DC | PRN

## 2014-11-12 MED ORDER — TETANUS-DIPHTH-ACELL PERTUSSIS 5-2.5-18.5 LF-MCG/0.5 IM SUSP: 0.5000 mL | Freq: Once | INTRAMUSCULAR | Status: DC

## 2014-11-12 MED ORDER — CITRIC ACID-SODIUM CITRATE 334-500 MG/5ML PO SOLN: 30.0000 mL | ORAL | Status: DC | PRN

## 2014-11-12 MED ORDER — BENZOCAINE-MENTHOL 20-0.5 % EX AERO: 1.0000 "application " | INHALATION_SPRAY | CUTANEOUS | Status: DC | PRN

## 2014-11-12 MED ORDER — IBUPROFEN 600 MG PO TABS
600.0000 mg | ORAL_TABLET | Freq: Four times a day (QID) | ORAL | Status: DC
  Administered 2014-11-12 – 2014-11-13 (×5): 600 mg via ORAL
  Filled 2014-11-12 (×5): qty 1

## 2014-11-12 MED ORDER — DIBUCAINE 1 % RE OINT: 1.0000 "application " | TOPICAL_OINTMENT | RECTAL | Status: DC | PRN

## 2014-11-12 MED ORDER — ACETAMINOPHEN 325 MG PO TABS: 650.0000 mg | ORAL_TABLET | ORAL | Status: DC | PRN

## 2014-11-12 MED ORDER — ONDANSETRON HCL 4 MG/2ML IJ SOLN: 4.0000 mg | INTRAMUSCULAR | Status: DC | PRN

## 2014-11-12 MED ORDER — SIMETHICONE 80 MG PO CHEW: 80.0000 mg | CHEWABLE_TABLET | ORAL | Status: DC | PRN

## 2014-11-12 MED ORDER — OXYCODONE-ACETAMINOPHEN 5-325 MG PO TABS
2.0000 | ORAL_TABLET | ORAL | Status: DC | PRN
  Administered 2014-11-13: 2 via ORAL
  Filled 2014-11-12: qty 2

## 2014-11-12 MED ORDER — OXYCODONE-ACETAMINOPHEN 5-325 MG PO TABS: 2.0000 | ORAL_TABLET | ORAL | Status: DC | PRN

## 2014-11-12 MED ORDER — LIDOCAINE HCL (PF) 1 % IJ SOLN
30.0000 mL | INTRAMUSCULAR | Status: DC | PRN
  Filled 2014-11-12: qty 30

## 2014-11-12 MED ORDER — LANOLIN HYDROUS EX OINT: TOPICAL_OINTMENT | CUTANEOUS | Status: DC | PRN

## 2014-11-12 MED ORDER — DIPHENHYDRAMINE HCL 25 MG PO CAPS: 25.0000 mg | ORAL_CAPSULE | Freq: Four times a day (QID) | ORAL | Status: DC | PRN

## 2014-11-12 NOTE — Progress Notes (Signed)
Patient admitted to mother baby. Education and care plan reviewed with interpreter present. MOB stated she would like to breast and bottle feed. Patient states no other questions at this time.

## 2014-11-12 NOTE — Progress Notes (Signed)
Pt received to room 173 via stretcher from MAU with baby on stretcher. Assisted mom into bed- infant dried and placed on moms abdomen. Dr. Doroteo Glassman and Dr. Loreta Ave at bedside. Placenta delivered intact.

## 2014-11-12 NOTE — Progress Notes (Signed)
Patient stated that she could not read Spanish, admission papers on Mother Baby done with interpreter.

## 2014-11-12 NOTE — MAU Note (Signed)
Pt presents to MAU via EMS with rupture of membranes, upon arrival VE complete with membrane palpated.

## 2014-11-12 NOTE — Progress Notes (Signed)
Patient had been up to bathroom, ambulated well and voided.  Peri care performed. No questions at this time.

## 2014-11-12 NOTE — H&P (Signed)
LABOR ADMISSION HISTORY AND PHYSICAL  Shannon Myers is a 29 y.o. female [phone] with IUP at [redacted]w[redacted]d by 9wk [place] who presented in advanced active labor. Patient was brought to MAU by EMS with cervical dilation of 10 and bulging bag. She plans on breast and bottle feeding.   Dating: By Shannon Myers [place] --->  Estimated Date of Delivery: 11/27/14  Prenatal History/Complications: -Received care at HD  Past Medical History: Past Medical History  Diagnosis Date  . Preterm labor   . Anemia   . Vaginal bleeding in pregnancy     Last pregnancy    Past Surgical History: Past Surgical History  Procedure Laterality Date  . No past surgeries      Obstetrical History: OB History    Gravida Para Term Preterm AB TAB SAB Ectopic Multiple Living   Social History: History   Social History  . Marital Status: Married    Spouse Name: N/A  . Number of Children: N/A  . Years of Education: N/A   Social History Main Topics  . Smoking status: Never Smoker   . Smokeless tobacco: Never Used  . Alcohol Use: No  . Drug Use: No  . Sexual Activity: Yes    Birth Control/ Protection: None   Other Topics Concern  . None   Social History Narrative    Family History: Family History  Problem Relation Age of Onset  . Alcohol abuse Neg Hx   . Asthma Neg Hx   . Arthritis Neg Hx   . Birth defects Neg Hx   . Cancer Neg Hx   . COPD Neg Hx   . Depression Neg Hx   . Drug abuse Neg Hx   . Early death Neg Hx   . Hearing loss Neg Hx   . Heart disease Neg Hx   . Hyperlipidemia Neg Hx   . Hypertension Neg Hx   . Kidney disease Neg Hx   . Learning disabilities Neg Hx   . Mental illness Neg Hx   . Mental retardation Neg Hx   . Miscarriages / Stillbirths Neg Hx   . Stroke Neg Hx   . Vision loss Neg Hx   . Diabetes Mother     Allergies: No Known Allergies  Prescriptions prior to admission  Medication Sig Dispense Refill Last Dose  . acetaminophen (TYLENOL) 500 MG tablet Take  1,000 mg by mouth every 6 (six) hours as needed for moderate pain or headache.   Taking  . cephALEXin (KEFLEX) 250 MG capsule Take 1 capsule (250 mg total) by mouth 4 (four) times daily. 28 capsule 0 Taking  . cyclobenzaprine (FLEXERIL) 10 MG tablet Take 1 tablet (10 mg total) by mouth 2 (two) times daily as needed for muscle spasms. Print directions in Spanish (Patient not taking: Reported on 10/22/2014) 20 tablet 0 Not Taking  . Prenatal Vit-Fe Fumarate-FA (PRENATAL MULTIVITAMIN) TABS tablet Take 1 tablet by mouth daily. 30 tablet 2 Taking     Review of Systems  Systems reviewed and negative except as stated in HPI   BP 128/84 mmHg  Pulse 92  Temp(Src) 98.3 F (36.8 C) (Oral)  Resp 20  Ht 5' (1.524 m)  Wt 171 lb (77.565 kg)  BMI 33.40 kg/m2  LMP 01/24/2014 General appearance: alert and cooperative Lungs: normal WOB Heart: regular rate  Abdomen: gravid, non-tender Extremities: Homans sign is negative, no sign of DVT, edema Presentation: cephalic  Pelvic: Dilation: 10 Exam by:: ginger morris rn   Prenatal labs: ABO, Rh: --/--/O POS (05/23 2100) Antibody: NEG (05/23 2100) Rubella:  Immune RPR: Nonreactive (11/05 0000)  HBsAg: Negative (11/05 0000)  HIV: Non-reactive (05/23 0000)  GBS: Negative (05/23 0000)  Anatomy US normal  Prenatal Transfer Tool  Updated in chart  No results found for this or any previous visit (from the past 24 hour(s)).  Patient Active Problem List   Diagnosis Date Noted  . Active labor 11/12/2014  . Supervision of high risk pregnancy in third trimester 11/10/2014  . Threatened preterm labor 10/22/2014  . Obesity affecting pregnancy in first trimester, antepartum   . Obesity 01/19/2013  . Previous preterm delivery in third trimester, antepartum 01/19/2013    Assessment: Shannon Myers is a 29 y.o. [phone] at [redacted]w[redacted]d here for advanced active labor. Brought in by EMS with bulging bag and dilation to 10cm.   #Labor: Expectant management for  precipitous SVD.  #ID: GBS negative   Caryl Ada, DO 11/12/2014, 9:22 AM PGY-1, [facility]     OB fellow attestation:  I have seen and examined this patient; I agree with above documentation in the resident's note.   Shannon Myers is a 29 y.o. [phone] here in advanced labor  PE: BP 122/66 mmHg  Pulse 83  Temp(Src) 98.3 F (36.8 C) (Oral)  Resp 20  Ht 5' (1.524 m)  Wt 171 lb (77.565 kg)  BMI 33.40 kg/m2  LMP 01/24/2014  Breastfeeding? Unknown Gen: calm comfortable, NAD Resp: normal effort, no distress Abd: gravid  ROS, labs, PMH reviewed  Plan: As above, precipitous delivery in bed en route to L&D  [name] 11/12/2014, 11:58 AM

## 2014-11-12 NOTE — Progress Notes (Signed)
UR chart review completed.  

## 2014-11-13 LAB — RPR: RPR Ser Ql: NONREACTIVE

## 2014-11-13 MED ORDER — IBUPROFEN 600 MG PO TABS: 600.0000 mg | ORAL_TABLET | Freq: Four times a day (QID) | ORAL | Status: DC

## 2014-11-13 NOTE — Lactation Note (Signed)
This note was copied from the chart of Shannon Rahi Heylee Greenlief. Lactation Consultation Note  P5.  BF for 3 months. Used [employer] [phone]. Mother denies problems or soreness. Reviewed supply and demand and stomach size. Suggest mother breastfeed first before giving formula to establish her milk supply. Provided volume guidelines for formula which is 7-12.  Baby receiving 15-30 ml of formula at 25 hours of age. Reviewed engorgement care and monitoring voids/stools.   Patient Name: Shannon Myers [id number]'C Date: 11/13/2014 Reason for consult: Follow-up assessment   Maternal Data Has patient been taught Hand Expression?: Yes Does the patient have breastfeeding experience prior to this delivery?: Yes  Feeding Feeding Type: Bottle Fed - Formula Nipple Type: Slow - flow Length of feed: 4 min  LATCH Score/Interventions Latch: Grasps breast easily, tongue down, lips flanged, rhythmical sucking. Intervention(s): Adjust position;Assist with latch;Breast compression  Audible Swallowing: A few with stimulation  Type of Nipple: Everted at rest and after stimulation  Comfort (Breast/Nipple): Soft / non-tender     Hold (Positioning): Assistance needed to correctly position infant at breast and maintain latch.  LATCH Score: 8  Lactation Tools Discussed/Used     Consult Status Consult Status: Complete    Hardie Pulley 11/13/2014, 10:44 AM

## 2014-11-13 NOTE — Discharge Summary (Cosign Needed)
Obstetric Discharge Summary Reason for Admission: advanced active labor Prenatal Procedures: NST and ultrasound Intrapartum Procedures: spontaneous vaginal delivery Postpartum Procedures: none Complications-Operative and Postpartum: none  Delivery Note At 8:43 AM a viable female was delivered via Vaginal, Spontaneous Delivery. (Presentation: Unknown). APGAR: 8, 9; weight 6 lb 7.7 oz (2940 g). Placenta status: Intact, Spontaneous. Cord: 3 vessels with the following complications: None.   Anesthesia: None  Episiotomy: None Lacerations: None Est. Blood Loss (mL): 100  Upon arrival patient was being wheeled down the hallway by EMS. She was complete and pushing. Patient had precipitous delivery and delivered in hallway on way to L&D room. She had SROM at time of delivery. Upon arrival to room baby was noted to have good tone and place on maternal abdomen for drying and stimulation. Delayed cord clamping performed. Placenta delivered intact with 3V cord. Vaginal canal and perineum was inspected and no lacerations appreciated; hemostatic. Pitocin IM was delivered as patient had no IV access. Uterus massaged until bleeding slowed.   Mom to postpartum. Baby to Couplet care / Skin to Skin.  Hospital Course:  Active Problems: Recent precipitous SVD  Shannon Myers is a 29 y.o. [phone] s/p SVD.  Patient was admitted already in advanced labor.  She has postpartum course that was uncomplicated. The pt feels ready to go home and  will be discharged with outpatient follow-up.   Today: No acute events overnight.  Pt denies problems with ambulating, voiding or po intake.  She denies nausea or vomiting.  Pain is well controlled.  She has had flatus. She has had bowel movement.  Lochia Small.  Plan for birth control is  oral contraceptives (estrogen/progesterone).  Method of Feeding: Breast and Bottle  Physical Exam:  General: alert, cooperative and no distress Lochia: appropriate Uterine Fundus:  firm DVT Evaluation: No evidence of DVT seen on physical exam. Negative Homan's sign.  H/H: Lab Results  Component Value Date/Time   HGB 12.5 11/12/2014 08:55 AM   HGB 13.4 04/05/2014   HCT 36.7 11/12/2014 08:55 AM   HCT 40 04/05/2014    Discharge Diagnoses: Term Pregnancy-delivered  Discharge Information: Date: 11/13/2014 Activity: pelvic rest Diet: routine  Medications: Ibuprofen Breast feeding:  Yes Condition: stable Instructions: refer to handout Discharge to: home   Discharge Instructions    Activity as tolerated    Complete by:  As directed      Call MD for:  severe uncontrolled pain    Complete by:  As directed      Call MD for:  temperature >100.4    Complete by:  As directed      Diet - low sodium heart healthy    Complete by:  As directed      Discharge patient    Complete by:  As directed      Sexual acrtivity    Complete by:  As directed   Pelvic rest            Medication List    STOP taking these medications        acetaminophen 500 MG tablet  Commonly known as:  TYLENOL     cephALEXin 250 MG capsule  Commonly known as:  KEFLEX     cyclobenzaprine 10 MG tablet  Commonly known as:  FLEXERIL     prenatal multivitamin Tabs tablet      TAKE these medications        ibuprofen 600 MG tablet  Commonly known as:  ADVIL,MOTRIN  Take 1 tablet (  600 mg total) by mouth every 6 (six) hours.           Follow-up Information    Follow up with [facility] [place].   Why:  for 4-6 week postpartum check   Contact information:   [address] [place] [address] [phone]      Fleeta Emmer ,MS3 11/13/2014,8:30 AM  OB fellow attestation I have seen and examined this patient and agree with above documentation in the student's note.   Shannon Myers is a 29 y.o. [phone] s/p NSVD.   Pain is well controlled.  Plan for birth control is oral contraceptives (estrogen/progesterone).  Method of Feeding:  breast+bottle  PE:  BP 96/54 mmHg  Pulse 72  Temp(Src) 98.2 F (36.8 C) (Oral)  Resp 18  Ht 5' (1.524 m)  Wt 171 lb (77.565 kg)  BMI 33.40 kg/m2  SpO2 100%  LMP 01/24/2014  Breastfeeding? Unknown Fundus firm   Recent Labs  11/12/14 0855  HGB 12.5  HCT 36.7     Plan: discharge today - postpartum care discussed - f/u clinic in 6 weeks for postpartum visit   William Dalton, MD 8:42 AM

## 2014-11-13 NOTE — Progress Notes (Signed)
Discharge instructions reviewed with pt through house interpreter.  Pt verbalized understanding and signed all paper work.  Prescription given,  And pt instructed to call husband to come pick her up.  Instructed pt to call the nurse when baby is in the car seat to have hugs removed and walked out.  Pt verbalized understanding.

## 2014-11-13 NOTE — Discharge Instructions (Signed)

## 2014-11-22 ENCOUNTER — Encounter: Payer: Self-pay | Admitting: Family

## 2014-12-14 ENCOUNTER — Emergency Department (HOSPITAL_COMMUNITY): Payer: Self-pay

## 2014-12-14 ENCOUNTER — Emergency Department (HOSPITAL_COMMUNITY)
Admission: EM | Admit: 2014-12-14 | Discharge: 2014-12-15 | Disposition: A | Discharge status: Home / Self Care | Payer: Self-pay | Attending: Emergency Medicine | Admitting: Emergency Medicine

## 2014-12-14 ENCOUNTER — Encounter (HOSPITAL_COMMUNITY): Payer: Self-pay | Admitting: Emergency Medicine

## 2014-12-14 DIAGNOSIS — R1030 Lower abdominal pain, unspecified: Secondary | ICD-10-CM | POA: Insufficient documentation

## 2014-12-14 DIAGNOSIS — N644 Mastodynia: Secondary | ICD-10-CM | POA: Insufficient documentation

## 2014-12-14 DIAGNOSIS — R000 Tachycardia, unspecified: Secondary | ICD-10-CM | POA: Insufficient documentation

## 2014-12-14 DIAGNOSIS — R51 Headache: Secondary | ICD-10-CM | POA: Insufficient documentation

## 2014-12-14 DIAGNOSIS — R509 Fever, unspecified: Secondary | ICD-10-CM | POA: Insufficient documentation

## 2014-12-14 DIAGNOSIS — K5900 Constipation, unspecified: Secondary | ICD-10-CM | POA: Insufficient documentation

## 2014-12-14 DIAGNOSIS — O9989 Other specified diseases and conditions complicating pregnancy, childbirth and the puerperium: Secondary | ICD-10-CM | POA: Insufficient documentation

## 2014-12-14 DIAGNOSIS — R0602 Shortness of breath: Secondary | ICD-10-CM | POA: Insufficient documentation

## 2014-12-14 DIAGNOSIS — B349 Viral infection, unspecified: Secondary | ICD-10-CM | POA: Insufficient documentation

## 2014-12-14 DIAGNOSIS — M79601 Pain in right arm: Secondary | ICD-10-CM | POA: Insufficient documentation

## 2014-12-14 DIAGNOSIS — O9963 Diseases of the digestive system complicating the puerperium: Secondary | ICD-10-CM | POA: Insufficient documentation

## 2014-12-14 DIAGNOSIS — Z862 Personal history of diseases of the blood and blood-forming organs and certain disorders involving the immune mechanism: Secondary | ICD-10-CM | POA: Insufficient documentation

## 2014-12-14 DIAGNOSIS — R1031 Right lower quadrant pain: Secondary | ICD-10-CM | POA: Insufficient documentation

## 2014-12-14 DIAGNOSIS — M549 Dorsalgia, unspecified: Secondary | ICD-10-CM | POA: Insufficient documentation

## 2014-12-14 DIAGNOSIS — R079 Chest pain, unspecified: Secondary | ICD-10-CM | POA: Insufficient documentation

## 2014-12-14 LAB — CBC
HCT: 40.3 % (ref 36.0–46.0)
HEMOGLOBIN: 14.1 g/dL (ref 12.0–15.0)
MCH: 28 pg (ref 26.0–34.0)
MCHC: 35 g/dL (ref 30.0–36.0)
MCV: 80.1 fL (ref 78.0–100.0)
Platelets: 193 10*3/uL (ref 150–400)
RBC: 5.03 MIL/uL (ref 3.87–5.11)
RDW: 14.3 % (ref 11.5–15.5)
WBC: 8.8 10*3/uL (ref 4.0–10.5)

## 2014-12-14 LAB — URINALYSIS, ROUTINE W REFLEX MICROSCOPIC
Bilirubin Urine: NEGATIVE
Glucose, UA: NEGATIVE mg/dL
Hgb urine dipstick: NEGATIVE
Ketones, ur: 15 mg/dL — AB
Leukocytes, UA: NEGATIVE
NITRITE: NEGATIVE
PROTEIN: NEGATIVE mg/dL
Specific Gravity, Urine: 1.012 (ref 1.005–1.030)
UROBILINOGEN UA: 1 mg/dL (ref 0.0–1.0)
pH: 8 (ref 5.0–8.0)

## 2014-12-14 LAB — D-DIMER, QUANTITATIVE (NOT AT ARMC): D DIMER QUANT: 0.45 ug{FEU}/mL (ref 0.00–0.48)

## 2014-12-14 LAB — ABO/RH: ABO/RH(D): O POS

## 2014-12-14 LAB — BASIC METABOLIC PANEL
Anion gap: 13 (ref 5–15)
BUN: 10 mg/dL (ref 6–20)
CO2: 17 mmol/L — AB (ref 22–32)
CREATININE: 0.56 mg/dL (ref 0.44–1.00)
Calcium: 9.1 mg/dL (ref 8.9–10.3)
Chloride: 105 mmol/L (ref 101–111)
GFR calc Af Amer: >60 mL/min (ref >60)
Glucose, Bld: 104 mg/dL — ABNORMAL HIGH (ref 65–99)
POTASSIUM: 3.4 mmol/L — AB (ref 3.5–5.1)
Sodium: 135 mmol/L (ref 135–145)

## 2014-12-14 LAB — I-STAT TROPONIN, ED: TROPONIN I, POC: 0 ng/mL (ref 0.00–0.08)

## 2014-12-14 LAB — TYPE AND SCREEN
ABO/RH(D): O POS
ANTIBODY SCREEN: NEGATIVE

## 2014-12-14 MED ORDER — ONDANSETRON HCL 4 MG/2ML IJ SOLN
4.0000 mg | Freq: Once | INTRAMUSCULAR | Status: AC
  Administered 2014-12-14: 4 mg via INTRAVENOUS
  Filled 2014-12-14: qty 2

## 2014-12-14 MED ORDER — MORPHINE SULFATE 4 MG/ML IJ SOLN
4.0000 mg | Freq: Once | INTRAMUSCULAR | Status: AC
  Administered 2014-12-14: 4 mg via INTRAVENOUS
  Filled 2014-12-14: qty 1

## 2014-12-14 MED ORDER — SODIUM CHLORIDE 0.9 % IV BOLUS (SEPSIS)
1000.0000 mL | Freq: Once | INTRAVENOUS | Status: AC
  Administered 2014-12-14: 1000 mL via INTRAVENOUS

## 2014-12-14 MED ORDER — ACETAMINOPHEN 325 MG PO TABS
650.0000 mg | ORAL_TABLET | Freq: Once | ORAL | Status: AC
  Administered 2014-12-14: 650 mg via ORAL
  Filled 2014-12-14: qty 2

## 2014-12-14 MED ORDER — IOHEXOL 300 MG/ML  SOLN
25.0000 mL | Freq: Once | INTRAMUSCULAR | Status: AC | PRN
  Administered 2014-12-14: 25 mL via ORAL

## 2014-12-14 NOTE — ED Provider Notes (Signed)
CSN: [id number]     Arrival date & time 12/14/14  2058 History   First MD Initiated Contact with Patient 12/14/14 2131     Chief Complaint  Patient presents with  . Chest Pain    The patient said she started having chest pain yesterday and she thought it would go away with tylenol.  She says it has not gone away and she now has pain in her lower vagina and a headache.     (Consider location/radiation/quality/duration/timing/severity/associated sxs/prior Treatment) HPI   29 year old Hispanic speaking female one month postpartum presenting with multiple complaints. History obtained using [place] phone interpreter. Patient is a G5 P5. Patient report she develop acute onset of sharp pleuritic chest pain, 9 out of 10, persistent throughout yesterday and persisted today. She also reported having associated shortness of breath and right arm pain. She endorses having a sharp throbbing frontal headache. She endorses having lower abdominal pain, felt nauseous and Vomited yesterday. She complained of constipation having to strain and having bright red blood per rectum with bowel movement. Complaining of pain throughout her back which radiates down to her legs. Also endorse fever and chills. Complaining of pain to both breast worsening with breast-feeding. She has tried taking Tylenol with minimal improvement. Endorse having pain to her low vagina as well. No prior history of PE or DVT.  she has spontaneous vaginal delivery without any surgical intervention.      Past Medical History  Diagnosis Date  . Preterm labor   . Anemia   . Vaginal bleeding in pregnancy     Last pregnancy   Past Surgical History  Procedure Laterality Date  . No past surgeries     Family History  Problem Relation Age of Onset  . Alcohol abuse Neg Hx   . Asthma Neg Hx   . Arthritis Neg Hx   . Birth defects Neg Hx   . Cancer Neg Hx   . COPD Neg Hx   . Depression Neg Hx   . Drug abuse Neg Hx   . Early death Neg Hx   .  Hearing loss Neg Hx   . Heart disease Neg Hx   . Hyperlipidemia Neg Hx   . Hypertension Neg Hx   . Kidney disease Neg Hx   . Learning disabilities Neg Hx   . Mental illness Neg Hx   . Mental retardation Neg Hx   . Miscarriages / Stillbirths Neg Hx   . Stroke Neg Hx   . Vision loss Neg Hx   . Diabetes Mother    History  Substance Use Topics  . Smoking status: Never Smoker   . Smokeless tobacco: Never Used  . Alcohol Use: No   OB History    Gravida Para Term Preterm AB TAB SAB Ectopic Multiple Living   6 6 5 1      0 6     Review of Systems  All other systems reviewed and are negative.     Allergies  Review of patient's allergies indicates no known allergies.  Home Medications   Prior to Admission medications   Medication Sig Start Date End Date Taking? Authorizing Provider  ibuprofen (ADVIL,MOTRIN) 600 MG tablet Take 1 tablet (600 mg total) by mouth every 6 (six) hours. 11/13/14   Fabio AsaMonica T Lawler, MD   Breastfeeding? Yes Physical Exam  Constitutional: She appears well-developed and well-nourished. No distress.  Hispanic female resting in bed appears to be fatigued.  HENT:  Head: Atraumatic.  Mouth/Throat: Oropharynx is  clear and moist.  Eyes: Conjunctivae are normal.  Neck: Neck supple.  No nuchal rigidity  Cardiovascular:  Tachycardia without murmurs rubs or gallops  Pulmonary/Chest:  Shallow breathing, tachypnea and tachycardia without accessory muscle use. No obvious wheezes, rales, or rhonchi appreciated  Chaperone present during exam. Bilateral breasts without evidence of mastitis or infection. Patient is lactating.  Abdominal: Soft. Bowel sounds are normal. She exhibits no distension. There is tenderness (mild diffuse abdominal tenderness most significant to suprapubic and RLQ.  No guarding or rebound tenderness). There is no rebound and no guarding.  Genitourinary:  Chaperone present during exam. No inguinal lymphadenopathy or inguinal hernia appreciated.  Normal external genitalia. No significant discomfort with speculum insertion. Normal vaginal vault. Closed cervical os with mild dystrophic skin changes and friability noted to the T-zone along with yellow discharge. On bimanual examination, no adnexal tenderness or cervical motion tenderness.  On rectal exam, evidence of non thrombosed external hemorrhoid.  No anal fissure, stool impaction or mass.  Normal rectal tone.    Neurological: She is alert.  Skin: No rash noted.  Psychiatric: She has a normal mood and affect.  Nursing note and vitals reviewed.   ED Course  Procedures (including critical care time)  Patient here with fever, having generalized body aches and multiple symptoms which may suggest possible infectious etiology. She does have a temperature of 100.4 and is tachycardic. She does have risk factors for PE including postpartum and tachycardia however tachycardia is likely secondary to elevated temperature. Her chest x-ray shows no signs of infection. D-dimer was negative which makes PE less likely. Urine shows no evidence of urinary tract infection. On pelvic examination patient does not have any significant evidence to suggest PID. She does have low abdominal pain and pain to the right lower quadrant. Although patient has no evidence of leukocytosis, plan to obtain an abdominal and pelvis CT scan to rule out acute pathology. If negative, patient will need to follow-up with a primary care provider for further evaluation. Patient able to ambulate while maintaining 99% on RA. She has no evidence of mastitis on breast exam.  12:53 AM Care discussed with oncoming provider who will f/u on CT scan result and will d/c as appropriate.    Labs Review Labs Reviewed  WET PREP, GENITAL - Abnormal; Notable for the following:    Clue Cells Wet Prep HPF POC FEW (*)    WBC, Wet Prep HPF POC MODERATE (*)    All other components within normal limits  BASIC METABOLIC PANEL - Abnormal; Notable for  the following:    Potassium 3.4 (*)    CO2 17 (*)    Glucose, Bld 104 (*)    All other components within normal limits  URINALYSIS, ROUTINE W REFLEX MICROSCOPIC (NOT AT [facility]) - Abnormal; Notable for the following:    Ketones, ur 15 (*)    All other components within normal limits  CULTURE, BLOOD (ROUTINE X 2)  CULTURE, BLOOD (ROUTINE X 2)  CBC  D-DIMER, QUANTITATIVE (NOT AT [facility])  PREGNANCY, URINE  I-STAT TROPOININ, ED  POC OCCULT BLOOD, ED  I-STAT CG4 LACTIC ACID, ED  TYPE AND SCREEN  ABO/RH  GC/CHLAMYDIA PROBE AMP ([place]) NOT AT [facility]    Imaging Review Dg Chest 2 View  12/14/2014   CLINICAL DATA:  Chest pain for 2 days, initial encounter  EXAM: CHEST - 2 VIEW  COMPARISON:  None.  FINDINGS: The heart size and mediastinal contours are within normal limits. Both lungs are  clear. The visualized skeletal structures are unremarkable.  IMPRESSION: No active disease.   Electronically Signed   By: Alcide Clever M.D.   On: 12/14/2014 21:29     EKG Interpretation None      MDM   Final diagnoses:  None    BP 103/55 mmHg  Pulse 108  Temp(Src) 100.4 F (38 C) (Oral)  Resp 18  SpO2 99%  Breastfeeding? Yes     Fayrene Helper, PA-C 12/15/14 1610  Linwood Dibbles, MD 12/15/14 [phone]

## 2014-12-14 NOTE — ED Notes (Signed)
CTcalled stated need a pregnancy result before CT.

## 2014-12-14 NOTE — ED Notes (Signed)
The patient said she started having chest pain yesterday and she thought it would go away with tylenol.  She says it has not gone away and she now has pain in her lower vagina and a headache.  The patient also says she is straining to have a BM and says she bleeds.  She is breastfeeding and says her right breast is warm and has knots.  She rates her pain 9/10.

## 2014-12-15 ENCOUNTER — Encounter (HOSPITAL_COMMUNITY): Payer: Self-pay | Admitting: Radiology

## 2014-12-15 ENCOUNTER — Emergency Department (HOSPITAL_COMMUNITY): Payer: Medicaid Other

## 2014-12-15 LAB — I-STAT CG4 LACTIC ACID, ED: LACTIC ACID, VENOUS: 0.65 mmol/L (ref 0.5–2.0)

## 2014-12-15 LAB — POC OCCULT BLOOD, ED: Fecal Occult Bld: NEGATIVE

## 2014-12-15 LAB — WET PREP, GENITAL
Trich, Wet Prep: NONE SEEN
Yeast Wet Prep HPF POC: NONE SEEN

## 2014-12-15 LAB — PREGNANCY, URINE: PREG TEST UR: NEGATIVE

## 2014-12-15 LAB — I-STAT TROPONIN, ED: Troponin i, poc: 0 ng/mL (ref 0.00–0.08)

## 2014-12-15 MED ORDER — SODIUM CHLORIDE 0.9 % IV BOLUS (SEPSIS)
1000.0000 mL | Freq: Once | INTRAVENOUS | Status: AC
  Administered 2014-12-15: 1000 mL via INTRAVENOUS

## 2014-12-15 MED ORDER — ACETAMINOPHEN 325 MG PO TABS: 650.0000 mg | ORAL_TABLET | Freq: Four times a day (QID) | ORAL | Status: DC | PRN

## 2014-12-15 MED ORDER — ONDANSETRON HCL 4 MG PO TABS: 4.0000 mg | ORAL_TABLET | Freq: Four times a day (QID) | ORAL | Status: DC

## 2014-12-15 MED ORDER — IOHEXOL 300 MG/ML  SOLN
100.0000 mL | Freq: Once | INTRAMUSCULAR | Status: AC | PRN
  Administered 2014-12-15: 100 mL via INTRAVENOUS

## 2014-12-15 NOTE — ED Notes (Addendum)
Pulse Ox maintained at 99% on room air throughout ambulation down Pod A hall.

## 2014-12-15 NOTE — ED Provider Notes (Signed)
0400 - Patient care assumed from Shannon Helper, PA-C at shift change. Patient pending CT scan for evaluation of fever. Patient presented to the emergency department with multiple complaints including chest pain, shortness of breath, arm pain, headache, lower abdominal pain, vomiting, and hematochezia. Imaging and labs reviewed. Patient has no evidence of emergent process on CT scan. Chest x-ray is also negative. Laboratory workup is noncontributory. Patient has no leukocytosis. Lactic acid level WNL. Hemoccult is negative. Dimer also WNL.  Patient states that she is feeling much better on my evaluation. She has no specific complaints of pain at this time. I have reviewed the patient's labs and imaging with her using the interpreter phone. She reports to me that she feels comfortable managing her symptoms further as an outpatient. No indication for further emergent workup at this time. Will manage supportively as outpatient with Tylenol and Zofran. Primary care follow-up advised and resource guide provided as patient denies establish care with a primary physician. Return precautions given. Patient agreeable to plan with no unaddressed concerns. Patient discharged in good condition; VSS.   Filed Vitals:   12/15/14 0345 12/15/14 0400 12/15/14 0415 12/15/14 0423  BP: 110/53 106/58 108/62   Pulse: 92 96 107   Temp:    98.8 F (37.1 C)  TempSrc:    Oral  Resp: SpO2: 98% 99% 100%     Results for orders placed or performed during the hospital encounter of 12/14/14  Wet prep, genital  Result Value Ref Range   Yeast Wet Prep HPF POC NONE SEEN NONE SEEN   Trich, Wet Prep NONE SEEN NONE SEEN   Clue Cells Wet Prep HPF POC FEW (A) NONE SEEN   WBC, Wet Prep HPF POC MODERATE (A) NONE SEEN  Basic metabolic panel  Result Value Ref Range   Sodium 135 135 - 145 mmol/L   Potassium 3.4 (L) 3.5 - 5.1 mmol/L   Chloride 105 101 - 111 mmol/L   CO2 17 (L) 22 - 32 mmol/L   Glucose, Bld 104 (H) 65 - 99 mg/dL    BUN 10 6 - 20 mg/dL   Creatinine, Ser 1.61 0.44 - 1.00 mg/dL   Calcium 9.1 8.9 - 09.6 mg/dL   GFR calc non Af Amer >60 >60 mL/min   GFR calc Af Amer >60 >60 mL/min   Anion gap 13 5 - 15  CBC  Result Value Ref Range   WBC 8.8 4.0 - 10.5 K/uL   RBC 5.03 3.87 - 5.11 MIL/uL   Hemoglobin 14.1 12.0 - 15.0 g/dL   HCT 04.5 40.9 - 81.1 %   MCV 80.1 78.0 - 100.0 fL   MCH 28.0 26.0 - 34.0 pg   MCHC 35.0 30.0 - 36.0 g/dL   RDW 91.4 78.2 - 95.6 %   Platelets 193 150 - 400 K/uL  Urinalysis, Routine w reflex microscopic (not at [facility])  Result Value Ref Range   Color, Urine YELLOW YELLOW   APPearance CLEAR CLEAR   Specific Gravity, Urine 1.012 1.005 - 1.030   pH 8.0 5.0 - 8.0   Glucose, UA NEGATIVE NEGATIVE mg/dL   Hgb urine dipstick NEGATIVE NEGATIVE   Bilirubin Urine NEGATIVE NEGATIVE   Ketones, ur 15 (A) NEGATIVE mg/dL   Protein, ur NEGATIVE NEGATIVE mg/dL   Urobilinogen, UA 1.0 0.0 - 1.0 mg/dL   Nitrite NEGATIVE NEGATIVE   Leukocytes, UA NEGATIVE NEGATIVE  D-dimer, quantitative (not at [facility])  Result Value Ref Range   D-Dimer, Quant  0.45 0.00 - 0.48 ug/mL-FEU  Pregnancy, urine  Result Value Ref Range   Preg Test, Ur NEGATIVE NEGATIVE  POC occult blood, ED  Result Value Ref Range   Fecal Occult Bld NEGATIVE NEGATIVE  I-stat troponin, ED  Result Value Ref Range   Troponin i, poc 0.00 0.00 - 0.08 ng/mL   Comment 3          I-Stat CG4 Lactic Acid, ED  Result Value Ref Range   Lactic Acid, Venous 0.65 0.5 - 2.0 mmol/L  I-stat troponin, ED  Result Value Ref Range   Troponin i, poc 0.00 0.00 - 0.08 ng/mL   Comment 3          Type and screen  Result Value Ref Range   ABO/RH(D) O POS    Antibody Screen NEG    Sample Expiration 12/17/2014   ABO/Rh  Result Value Ref Range   ABO/RH(D) O POS    Dg Chest 2 View  12/14/2014   CLINICAL DATA:  Chest pain for 2 days, initial encounter  EXAM: CHEST - 2 VIEW  COMPARISON:  None.  FINDINGS: The heart size and mediastinal contours are  within normal limits. Both lungs are clear. The visualized skeletal structures are unremarkable.  IMPRESSION: No active disease.   Electronically Signed   By: Alcide CleverMark  Lukens M.D.   On: 12/14/2014 21:29   Ct Abdomen Pelvis W Contrast  12/15/2014   CLINICAL DATA:  29 year old female with right lower quadrant abdominal pain  EXAM: CT ABDOMEN AND PELVIS WITH CONTRAST  TECHNIQUE: Multidetector CT imaging of the abdomen and pelvis was performed using the standard protocol following bolus administration of intravenous contrast.  CONTRAST:  100mL OMNIPAQUE IOHEXOL 300 MG/ML  SOLN  COMPARISON:  None.  FINDINGS: The visualized lung bases are clear. No intra-abdominal free air or free fluid.  The liver, gallbladder, pancreas, spleen, adrenal glands, kidneys, visualized ureters, and urinary bladder appear unremarkable. The uterus is anteverted and grossly unremarkable. The visualized ovaries appear unremarkable.  Moderate stool throughout the colon. No evidence of bowel obstruction. Normal appendix.  The visualized abdominal aorta and IVC appear patent. No portal venous gas. There is no lymphadenopathy. Anterior pelvic wall C-section scar noted. The osseous structures are unremarkable.  IMPRESSION: No acute intra-abdominal or pelvic pathology.  No evidence of bowel obstruction or inflammation.  Normal appendix.   Electronically Signed   By: Elgie CollardArash  Radparvar M.D.   On: 12/15/2014 02:36      Antony MaduraKelly Karlyn Glasco, PA-C 12/15/14 [phone]  Tomasita CrumbleAdeleke Oni, MD 12/15/14 [phone]

## 2014-12-15 NOTE — Discharge Instructions (Signed)
Infecciones virales (Viral Infections) La causa de las infecciones virales son diferentes tipos de virus.La mayora de las infecciones virales no son graves y se curan solas. Sin embargo, algunas infecciones pueden provocar sntomas graves y causar complicaciones.  SNTOMAS Las infecciones virales ocasionan:   Dolores de Advertising copywritergarganta.  Molestias.  Dolor de Turkmenistancabeza.  Mucosidad nasal.  Diferentes tipos de erupcin.  Lagrimeo.  Cansancio.  Tos.  Prdida del apetito.  Infecciones gastrointestinales que producen nuseas, vmitos y Guineadiarrea. Estos sntomas no responden a los antibiticos porque la infeccin no es por bacterias. Sin embargo, puede sufrir una infeccin bacteriana luego de la infeccin viral. Se denomina sobreinfeccin. Los sntomas de esta infeccin bacteriana son:   Jefferson Fuelmpeora el dolor en la garganta con pus y dificultad para tragar.  Ganglios hinchados en el cuello.  Escalofros y fiebre muy elevada o persistente.  Dolor de cabeza intenso.  Sensibilidad en los senos paranasales.  Malestar (sentirse enfermo) general persistente, dolores musculares y fatiga (cansancio).  Tos persistente.  Produccin mucosa con la tos, de color amarillo, verde o marrn. INSTRUCCIONES PARA EL CUIDADO DOMICILIARIO  Solo tome medicamentos que se pueden comprar sin receta o recetados para Chief Technology Officerel dolor, Dentistmalestar, la diarrea o la fiebre, como le indica el mdico.  Beba gran cantidad de lquido para mantener la orina de tono claro o color amarillo plido. Las bebidas deportivas proporcionan electrolitos,azcares e hidratacin.  Descanse lo suficiente y Abbott Laboratoriesalimntese bien. Puede tomar sopas y caldos con crackers o arroz. SOLICITE ATENCIN MDICA DE INMEDIATO SI:  Tiene dolor de cabeza, le falta el [place], siente dolor en el pecho, en el cuello o aparece una erupcin.  Tiene vmitos o diarrea intensos y no puede retener lquidos.  Usted o su nio tienen una temperatura oral de ms de 38,9 C  (102 F) y no puede controlarla con medicamentos.  Su beb tiene ms de 3 meses y su temperatura rectal es de 102 F (38.9 C) o ms.  Su beb tiene 3 meses o menos y su temperatura rectal es de 100.4 F (38 C) o ms. EST SEGURO QUE:   Comprende las instrucciones para el alta mdica.  Controlar su enfermedad.  Solicitar atencin mdica de inmediato segn las indicaciones. Document Released: 02/25/2005 Document Revised: 08/10/2011 [facility] Patient Information 2015 [place], [place]. This information is not intended to replace advice given to you by your health care provider. Make sure you discuss any questions you have with your health care provider.  Emergency Department Resource Guide 1) Find a Doctor and Pay Out of Pocket Although you won't have to find out who is covered by your insurance plan, it is a good idea to ask around and get recommendations. You will then need to call the office and see if the doctor you have chosen will accept you as a new patient and what types of options they offer for patients who are self-pay. Some doctors offer discounts or will set up payment plans for their patients who do not have insurance, but you will need to ask so you aren't surprised when you get to your appointment.  2) Contact Your Local Health Department Not all health departments have doctors that can see patients for sick visits, but many do, so it is worth a call to see if yours does. If you don't know where your local health department is, you can check in your phone book. The CDC also has a tool to help you locate your state's health department, and many state websites also have  listings of all of their local health departments.  3) Find a Walk-in Clinic If your illness is not likely to be very severe or complicated, you may want to try a walk in clinic. These are popping up all over the country in pharmacies, drugstores, and shopping centers. They're usually staffed by nurse practitioners  or physician assistants that have been trained to treat common illnesses and complaints. They're usually fairly quick and inexpensive. However, if you have serious medical issues or chronic medical problems, these are probably not your best option.  No Primary Care Doctor: - Call Health Connect at  [phone] - they can help you locate a primary care doctor that  accepts your insurance, provides certain services, etc. - Physician Referral Service- [phone]  Chronic Pain Problems: Organization         Address  Phone   Notes  [facility]  [phone] Patients need to be referred by their primary care doctor.   Medication Assistance: Organization         Address  Phone   Notes  [facility] Medication [facility] [address] [place], [place] [address] [phone] --Must be a resident of [facility] -- Must have NO insurance coverage whatsoever (no Medicaid/ Medicare, etc.) -- The pt. MUST have a primary care doctor that directs their care regularly and follows them in the community   MedAssist  [phone]   [employer]  [phone]    Agencies that provide inexpensive medical care: Organization         Address  Phone   Notes  [facility]  [phone]   [facility]    [phone]   [facility] [address] [place], [place] [address] [phone]   [facility] [address] [place] [place] [phone]   Planned Parenthood    [phone]   [facility]    [phone]   [facility]  [address], [place] Phone:  [phone], Fax:  [phone] Hours of Operation:  9 am - 6 pm, M-F.  Also accepts Medicaid/Medicare and self-pay.  [facility]  [address], [place] Phone: [phone], Fax: [phone]. Hours of Operation:  8:30 am - 5:30 pm, M-F.   Also accepts Medicaid and self-pay.  [facility] [place] [address], [place] Phone: [phone]   [facility] [address] Natasha Bence [place], [place] [phone], Ext. [phone] Mondays & Thursdays: 7-9 AM.  First 15 patients are seen on a first come, first serve basis.    Medicaid-accepting [facility] Providers:  Organization         Address  Phone   Notes  [facility] [address], [place] [phone] Also accepts self-pay patients.  [facility] [address] Laurell Josephs [place], [place]  [phone]   [facility] [address], [place] [phone]   [facility] [address] [place] [phone]   Renaye Rakers [address], [place]   [phone] Only accepts [place] Access [place] patients after they have their name applied to their card.   Self-Pay (no insurance) in [facility]:  [employer]  Notes  Sickle Cell Patients, [facility] [address] [place], [place] [phone]   [facility] [address] [place], [place] [phone]   [facility] [place]  [address], [place] [phone]   [facility]/Dr. Osei-Bonsu  [address] [place] or [address], [place] [phone] Phone number for both [place] and [place] locations is the same.  [facility] [address] [place] [phone]   [facility] [address], [place] or [address] [phone]   [facility] [address], [place] [phone], phone; [phone], fax Sees patients 1st and 3rd Saturday of every month.  Must not qualify for public or private insurance (i.e. Medicaid, Medicare, [place] Health Choice, Veterans'  Benefits)  Household income should be no more than 200% of the poverty level The clinic cannot treat you if you are pregnant or think you are pregnant  Sexually transmitted diseases are not treated at the clinic.    Dental Care: Organization         Address  Phone  Notes  [facility] Department of [facility] [address] [place], [place] [phone] Accepts children up to age 68 who are enrolled in [place] or [place] Health Choice; pregnant women with a Medicaid card; and children who have applied for Medicaid or [place] Health Choice, but were declined, whose parents can pay a reduced fee at time of service.  [facility] Department of [facility]  [address], [place] [phone] Accepts children up to age 41 who are enrolled in [place] or [place] Health Choice; pregnant women with a Medicaid card; and children who have applied for Medicaid or [place] Health Choice, but were declined, whose parents can pay a reduced fee at time of service.  [facility]  [address] [place], [place] [phone] Patients are seen by appointment only. Walk-ins are not accepted. [facility] will see patients 35 years of age and older. Monday - Tuesday (8am-5pm) Most Wednesdays (8:30-5pm) $30 per visit, cash only  [facility] Adult Dental Access PROGRAM  [address], [facility] [phone] Patients are seen by appointment only. Walk-ins are not accepted. [facility] will see patients 44 years of age and older. One Wednesday Evening (Monthly: Volunteer Based).  $30 per visit, cash only  [employer]  [phone] for adults; Children under age 53, call Graduate Pediatric Dentistry at [phone]. Children aged 68-14, please call [phone] to request a pediatric application.  Dental services are provided in all areas of dental care including fillings, crowns and bridges, complete and partial  dentures, implants, gum treatment, root canals, and extractions. Preventive care is also provided. Treatment is provided to both adults and children. Patients are selected via a lottery and there is often a waiting list.   [facility] [address], [place]  [phone] [email or web address]   [facility] [address] [place], [place] [phone], Ext. [phone] Second and Fourth Thursday of each month, opens at 6:30 AM; Clinic ends at 9 AM.  Patients are seen on a first-come first-served basis, and a limited number are seen during each clinic.   [facility]  [address] Ether Griffins [place], [place] [phone]  Eligibility Requirements You must have lived in [place], [place], or [place] counties for at least the last three months.   You cannot be eligible for state or federal sponsored [employer], including [employer], [place], or [employer].   You generally cannot be eligible for healthcare insurance through your employer.    How to apply: Eligibility screenings are held every Tuesday and Wednesday afternoon from 1:00 pm until 4:00 pm. You do not need an appointment for the interview!  [facility] [address], [place], [place]   [place]  [place] Department  [place]  [phone]    Behavioral Health Resources in the Community: Intensive Outpatient Programs Organization         Address  Phone  Notes  [place]. [address] [place], [place] [phone]   [facility] [address], [place], [place]   ADS: Alcohol & Drug Svcs [address], [place], [place]   [place] [address]  [place], [place] or [phone]   Substance Abuse Resources Organization          Address  Phone  Notes  Alcohol and Drug Services  [phone]   [place]  [phone]   [facility] [place]   Chinita Pester  [phone]   Residential & Outpatient Substance Abuse Program  [phone]   Psychological Services Organization         Address  Phone  Notes  [facility]  [place]  [phone]   [place] [address], [place] or [phone]    Mobile Crisis Teams Organization         Address  Phone  Notes  [facility], Mobile Crisis Care Unit  [phone]   [facility]  [address] [place], [place]   [place] Levels [address], [place] [phone]    Self-Help/Support Groups Organization         Address  Phone             Notes  [place]. of [place] - variety of support groups  [place] Call for more information  Narcotics Anonymous (NA), [facility] [address], [employer]  2 meetings at this location   Special educational needs teacher         Address  Phone  Notes  [facility] [place],    [place]  [phone]   [facility]  [address], [place] [address]342, [place], [place]   [place], [place] [phone] Admissions: 8am-3pm M-F  Incentives Substance [place] [address]    [place], [place] [phone]   [facility] [address] Jadene Pierini [place], [place]   [facility] [address]  [place], [place]   [facility] Programs - Intensive Outpatient [address], [place], [place]   [facility] ([place].,  [place], [place] or [phone]   [facility] ([facility]) [address], [place], [place] Accepts Medicaid  [facility] [place] [address]  [place] [phone] Substance Abuse/Addiction Treatment   [facility] Resources Organization  Address  Phone  Notes  °[facility]  [phone]   °Julie Brannon, PhD [address] [place], [place]   ([phone] or ([phone]   °[facility]   [address] °[place], [place] [phone]   °[facility] [address], [place], [place] [phone] Insurance/Medicaid/sponsorship through [facility]  °[facility] [address]                                    [place], [place] [phone] Therapy/tele-psych/case  °[facility] [address]  ° [place], [place] [phone]    °Dr. Arfeen  [phone]   °[facility] [place]  [employer] [place] [facility] 1) [address], [place] °2) [address], [place] °3)  [address], [place] [phone] °([phone]) [phone] ° °([phone]) [phone]   °[place] Child Abuse Hotline (336) [phone] or (336) [phone] (After Hours)    ° ° ° °

## 2014-12-17 ENCOUNTER — Ambulatory Visit: Payer: Self-pay | Admitting: Obstetrics & Gynecology

## 2014-12-17 LAB — GC/CHLAMYDIA PROBE AMP (~~LOC~~) NOT AT ARMC
CHLAMYDIA, DNA PROBE: NEGATIVE
Neisseria Gonorrhea: NEGATIVE

## 2014-12-20 LAB — CULTURE, BLOOD (ROUTINE X 2)
CULTURE: NO GROWTH
Culture: NO GROWTH

## 2015-07-23 ENCOUNTER — Encounter: Payer: Self-pay | Admitting: *Deleted

## 2015-09-06 ENCOUNTER — Ambulatory Visit: Payer: Self-pay | Admitting: Family Medicine

## 2016-01-21 ENCOUNTER — Encounter: Payer: Self-pay | Admitting: Pediatric Intensive Care

## 2016-01-21 DIAGNOSIS — Z139 Encounter for screening, unspecified: Secondary | ICD-10-CM

## 2016-01-21 LAB — GLUCOSE, POCT (MANUAL RESULT ENTRY): POC GLUCOSE: 81 mg/dL (ref 70–99)

## 2016-01-23 NOTE — Congregational Nurse Program (Signed)
Congregational Nurse Program Note  Date of Encounter: 01/21/2016  Past Medical History: Past Medical History:  Diagnosis Date  . Anemia   . Preterm labor   . Vaginal bleeding in pregnancy    Last pregnancy    Encounter Details:     CNP Questionnaire - 01/21/16 1445      Patient Demographics   Patient is considered a/an Immigrant   Race Latino/Hispanic     Patient Assistance   Location of Patient Assistance [employer]   Patient's financial/insurance status Self-Pay   Uninsured Patient Yes   Patient referred to apply for the following financial assistance [employer]Card/Care Connects   Food insecurities addressed Not Applicable   Transportation assistance No   Assistance securing medications No   Educational health offerings Health literacy;Navigating the healthcare system;Nutrition     Encounter Details   Primary purpose of visit Navigating the Healthcare System;Education/Health Concerns   Was an Emergency Department visit averted? Not Applicable   Does patient have a medical provider? No   Patient referred to Establish PCP   Was a mental health screening completed? (GAINS tool) No   Does patient have dental issues? No   Does patient have vision issues? No   Does your patient have an abnormal blood pressure today? No   Since previous encounter, have you referred patient for abnormal blood pressure that resulted in a new diagnosis or medication change? No   Does your patient have an abnormal blood glucose today? No   Since previous encounter, have you referred patient for abnormal blood glucose that resulted in a new diagnosis or medication change? No   Was there a life-saving intervention made? No     Client visit for health screen, Via interpreter Alis- Client states she would like blood pressure, blood sugar check. Denies history of hypertension and diabetes. States that she has a 4-5 month history of headaches and blurry vision that she links to birth control implant.  She contacted [facility] regarding this and desires to have implant removed. Client also c/o occaisional floaters and blurry vision. Client states that she drinks Herbalife supplements for weight loss. BP was normal and BG was 81. Clients states that she has only had supplements today. CN advised client that supplements do not replace meals. CN advised that client contact [facility] if she wants implant removed. CN gave client [employer]ur[employer] Company[employer] Card application and will assist client in finding medical home. Client to return to clinic as needed.

## 2016-07-13 ENCOUNTER — Other Ambulatory Visit (HOSPITAL_COMMUNITY): Payer: Self-pay | Admitting: Nurse Practitioner

## 2016-07-13 DIAGNOSIS — Z369 Encounter for antenatal screening, unspecified: Secondary | ICD-10-CM

## 2016-07-13 LAB — OB RESULTS CONSOLE HEPATITIS B SURFACE ANTIGEN: Hepatitis B Surface Ag: NEGATIVE

## 2016-07-13 LAB — OB RESULTS CONSOLE HIV ANTIBODY (ROUTINE TESTING): HIV: NONREACTIVE

## 2016-07-13 LAB — CYTOLOGY - PAP
Cystic Fibrosis Profile: NEGATIVE
DRUG SCREEN, URINE: NEGATIVE
Pap: NEGATIVE
URINE CULTURE, OB: NEGATIVE

## 2016-07-13 LAB — OB RESULTS CONSOLE ABO/RH: RH Type: POSITIVE

## 2016-07-13 LAB — OB RESULTS CONSOLE RPR: RPR: NONREACTIVE

## 2016-07-13 LAB — OB RESULTS CONSOLE RUBELLA ANTIBODY, IGM: Rubella: IMMUNE

## 2016-07-13 LAB — OB RESULTS CONSOLE PLATELET COUNT: Platelets: 165 10*3/uL

## 2016-07-13 LAB — OB RESULTS CONSOLE HGB/HCT, BLOOD
HCT: 38 %
Hemoglobin: 12.5 g/dL

## 2016-07-13 LAB — OB RESULTS CONSOLE GC/CHLAMYDIA
Chlamydia: NEGATIVE
Gonorrhea: NEGATIVE

## 2016-07-13 LAB — OB RESULTS CONSOLE ANTIBODY SCREEN: Antibody Screen: NEGATIVE

## 2016-07-20 ENCOUNTER — Encounter (HOSPITAL_COMMUNITY): Payer: Self-pay | Admitting: Nurse Practitioner

## 2016-07-28 ENCOUNTER — Encounter: Payer: Self-pay | Admitting: *Deleted

## 2016-07-28 ENCOUNTER — Encounter (HOSPITAL_COMMUNITY): Payer: Self-pay | Admitting: *Deleted

## 2016-07-29 ENCOUNTER — Encounter: Payer: Self-pay | Admitting: Obstetrics & Gynecology

## 2016-07-29 ENCOUNTER — Ambulatory Visit (HOSPITAL_COMMUNITY)
Admission: RE | Admit: 2016-07-29 | Discharge: 2016-07-29 | Disposition: A | Discharge status: Home / Self Care | Payer: Self-pay | Source: Ambulatory Visit | Attending: Nurse Practitioner | Admitting: Nurse Practitioner

## 2016-07-29 ENCOUNTER — Encounter: Payer: Self-pay | Admitting: *Deleted

## 2016-07-29 ENCOUNTER — Ambulatory Visit (INDEPENDENT_AMBULATORY_CARE_PROVIDER_SITE_OTHER): Payer: Medicaid Other | Admitting: Obstetrics & Gynecology

## 2016-07-29 ENCOUNTER — Encounter (HOSPITAL_COMMUNITY): Payer: Self-pay

## 2016-07-29 DIAGNOSIS — Z3682 Encounter for antenatal screening for nuchal translucency: Secondary | ICD-10-CM | POA: Insufficient documentation

## 2016-07-29 DIAGNOSIS — O09219 Supervision of pregnancy with history of pre-term labor, unspecified trimester: Secondary | ICD-10-CM

## 2016-07-29 DIAGNOSIS — Z3A12 12 weeks gestation of pregnancy: Secondary | ICD-10-CM | POA: Insufficient documentation

## 2016-07-29 DIAGNOSIS — O365910 Maternal care for other known or suspected poor fetal growth, first trimester, not applicable or unspecified: Secondary | ICD-10-CM | POA: Insufficient documentation

## 2016-07-29 DIAGNOSIS — O09899 Supervision of other high risk pregnancies, unspecified trimester: Secondary | ICD-10-CM | POA: Insufficient documentation

## 2016-07-29 DIAGNOSIS — Z369 Encounter for antenatal screening, unspecified: Secondary | ICD-10-CM

## 2016-07-29 DIAGNOSIS — O0990 Supervision of high risk pregnancy, unspecified, unspecified trimester: Secondary | ICD-10-CM | POA: Insufficient documentation

## 2016-07-29 DIAGNOSIS — O09211 Supervision of pregnancy with history of pre-term labor, first trimester: Secondary | ICD-10-CM

## 2016-07-29 MED ORDER — PROMETHAZINE HCL 25 MG PO TABS: 25.0000 mg | ORAL_TABLET | Freq: Four times a day (QID) | ORAL | 2 refills | Status: DC | PRN

## 2016-07-29 NOTE — Progress Notes (Signed)
Makena application faxed and received successful transmission.

## 2016-07-29 NOTE — Progress Notes (Signed)
Video Interpreter # [id number]  New ob packet given     PRENATAL VISIT NOTE  Subjective:  Shannon Myers is a 31 y.o. [phone] at 7943w5d being seen today for ongoing prenatal care.  She is currently monitored for the following issues for this high-risk pregnancy and has Obesity; Supervision of high-risk pregnancy; and History of preterm delivery, currently pregnant on her problem list.  Patient reports nausea and vomiting.  Contractions: Not present. Vag. Bleeding: None.   . Denies leaking of fluid.   The following portions of the patient's history were reviewed and updated as appropriate: allergies, current medications, past family history, past medical history, past social history, past surgical history and problem list. Problem list updated.  Objective:   Vitals:   07/29/16 1246  BP: 110/63  Pulse: 83  Weight: 180 lb 8 oz (81.9 kg)    Fetal Status:           General:  Alert, oriented and cooperative. Patient is in no acute distress.  Skin: Skin is warm and dry. No rash noted.   Cardiovascular: Normal heart rate noted  Respiratory: Normal respiratory effort, no problems with respiration noted  Abdomen: Soft, gravid, appropriate for gestational age. Pain/Pressure: Present     Pelvic:  Cervical exam deferred        Extremities: Normal range of motion.  Edema: None  Mental Status: Normal mood and affect. Normal behavior. Normal judgment and thought content.   Assessment and Plan:  Pregnancy: G7P5106 at 6643w5d  1. Supervision of high risk pregnancy, antepartum -One preterm delivery with 2nd pregnancy and then got 17P and had term delivery.  Had 3 more term deliveries without 17P (per pt).  Pt desires 17P again.  Pt will receive 16-36 weeks  2.  First trimester diabetes screen:  Negative at health dept  3.  Genetic screening:  First screen done today.  AFP at 16 weeks  4.  Nausea and vomiting:  Phenergan ordered.  Preterm labor symptoms and general obstetric precautions  including but not limited to vaginal bleeding, contractions, leaking of fluid and fetal movement were reviewed in detail with the patient. Please refer to After Visit Summary for other counseling recommendations.  Return in about 4 weeks (around 08/26/2016).   Lesly DukesKelly H Drevin Ortner, MD

## 2016-08-03 ENCOUNTER — Other Ambulatory Visit: Payer: Self-pay

## 2016-08-03 ENCOUNTER — Telehealth: Payer: Self-pay | Admitting: *Deleted

## 2016-08-03 NOTE — Telephone Encounter (Signed)
Lupita Leashonna from [facility] left voice message on nurse line. Wants to confirm intake of patient and schedule shipping of Makena. Please return her call.

## 2016-08-10 NOTE — Telephone Encounter (Signed)
Received a voicemail from 08/07/16 pm from [facility] stating calling to schedule shipment of makena.  I called [facility] and verified Shannon Myers's EDD and shipment will be sent on 08/19/16 and should be received by us on 08/20/16.

## 2016-08-17 IMAGING — CR DG CHEST 2V
2 series · 2 of 2 positions shown · non-contrast
Comparison: None.

CLINICAL DATA: Chest pain for 2 days, initial encounter

EXAM:
CHEST - 2 VIEW

[chest pa]
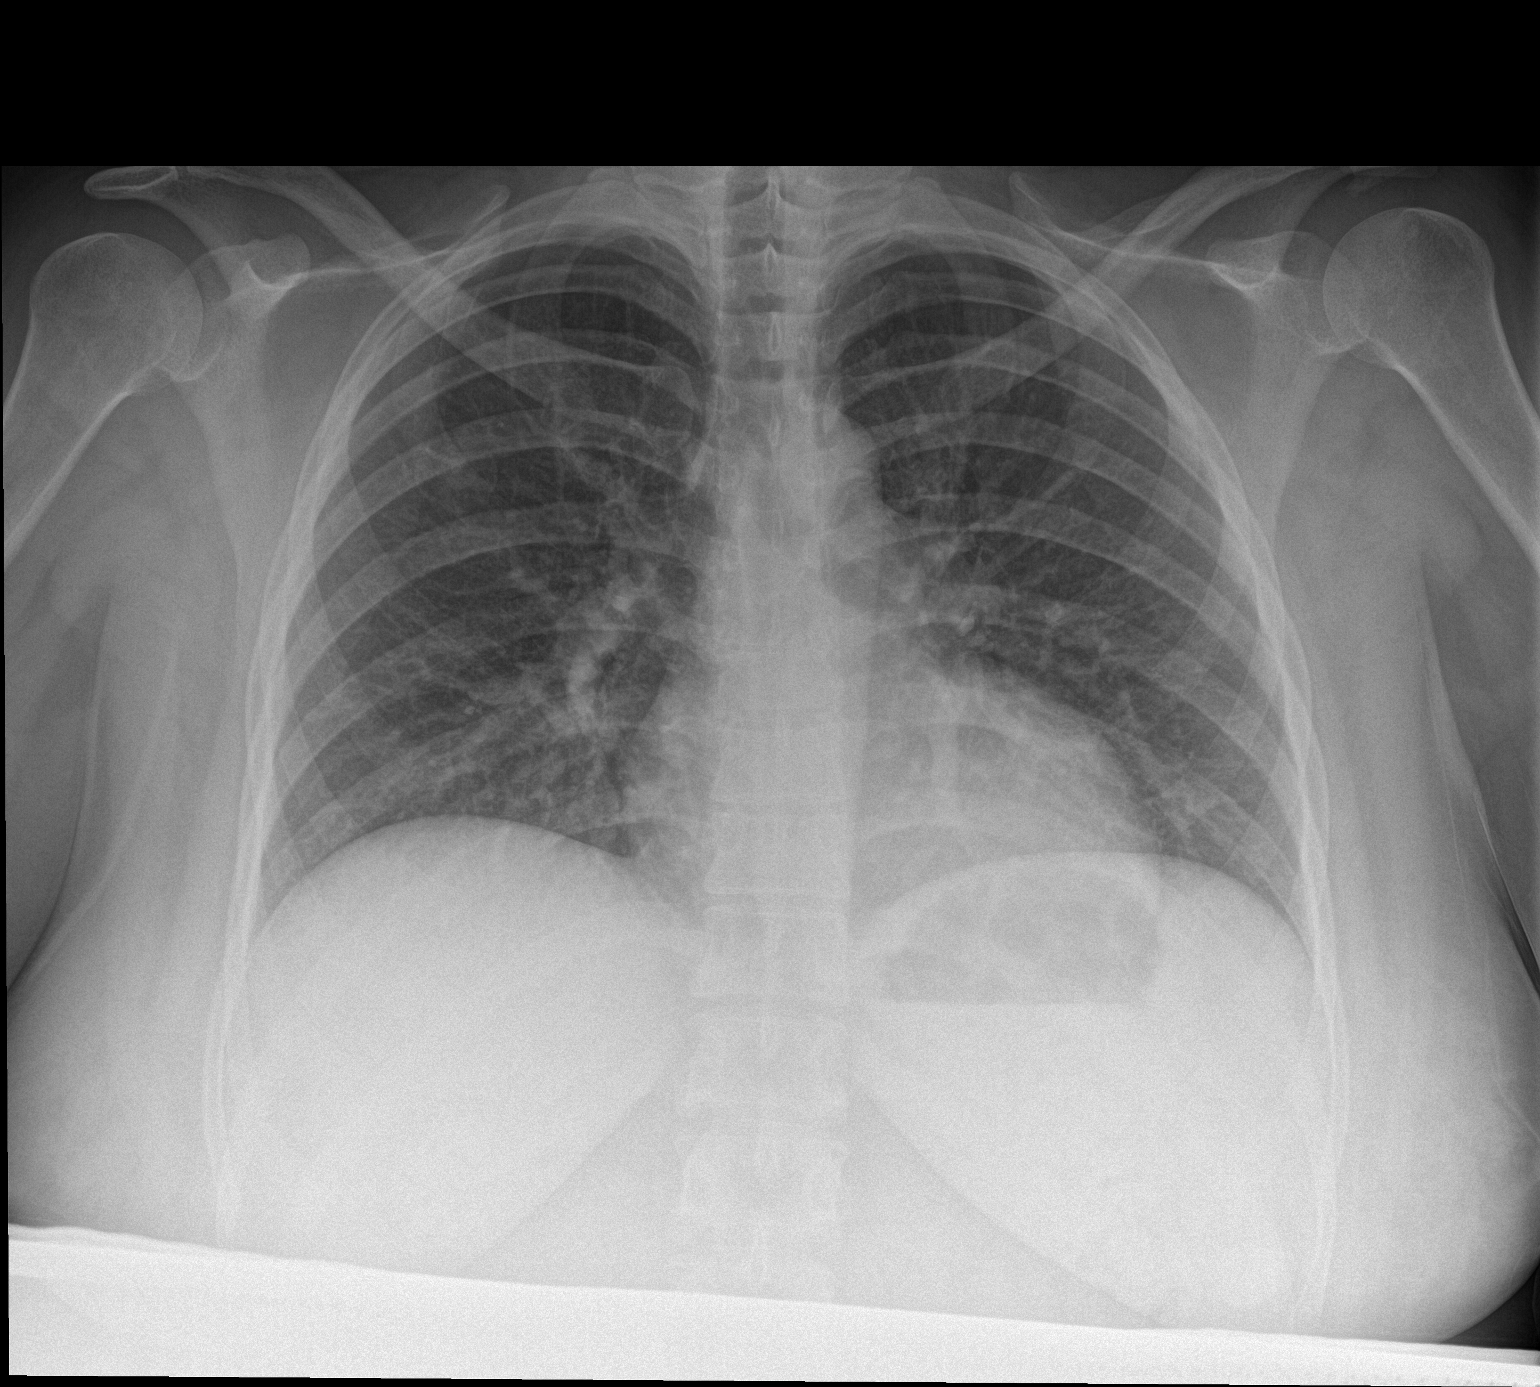

[chest lat]
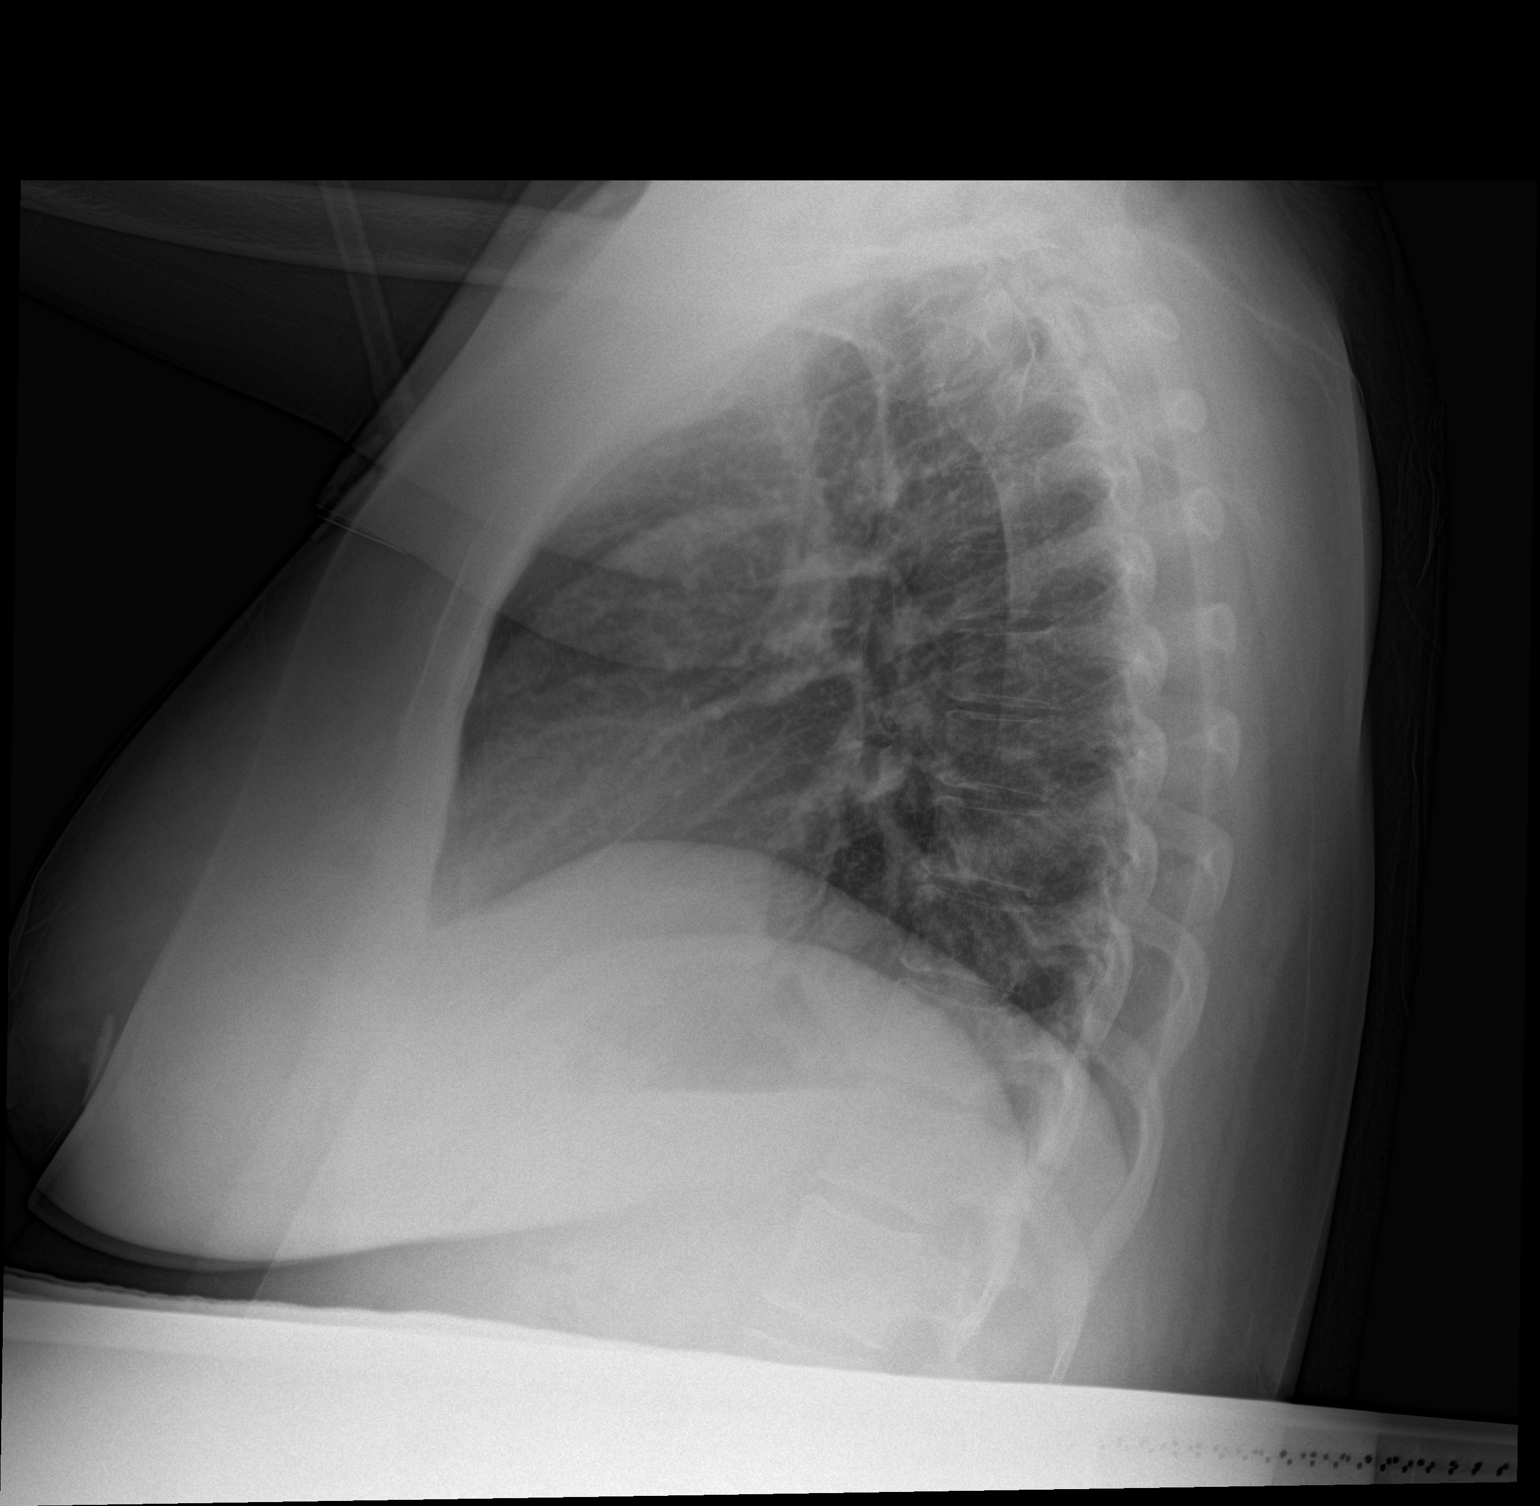

[2 of 2 positions shown; findings below may reference images not displayed]

FINDINGS: The heart size and mediastinal contours are within normal limits.
Both lungs are clear. The visualized skeletal structures are
unremarkable.
IMPRESSION: No active disease.

## 2016-08-27 ENCOUNTER — Encounter: Payer: Self-pay | Admitting: Family Medicine

## 2016-08-27 ENCOUNTER — Ambulatory Visit (INDEPENDENT_AMBULATORY_CARE_PROVIDER_SITE_OTHER): Payer: Medicaid Other | Admitting: Obstetrics and Gynecology

## 2016-08-27 VITALS — BP 107/65 | HR 85 | Wt 180.0 lb

## 2016-08-27 DIAGNOSIS — O09899 Supervision of other high risk pregnancies, unspecified trimester: Secondary | ICD-10-CM

## 2016-08-27 DIAGNOSIS — O09212 Supervision of pregnancy with history of pre-term labor, second trimester: Secondary | ICD-10-CM | POA: Diagnosis not present

## 2016-08-27 DIAGNOSIS — O09219 Supervision of pregnancy with history of pre-term labor, unspecified trimester: Principal | ICD-10-CM

## 2016-08-27 DIAGNOSIS — O0990 Supervision of high risk pregnancy, unspecified, unspecified trimester: Secondary | ICD-10-CM

## 2016-08-27 MED ORDER — HYDROXYPROGESTERONE CAPROATE 250 MG/ML IM OIL
250.0000 mg | TOPICAL_OIL | INTRAMUSCULAR | Status: DC
  Administered 2016-08-27 – 2017-01-08 (×16): 250 mg via INTRAMUSCULAR

## 2016-08-27 NOTE — Progress Notes (Signed)
   PRENATAL VISIT NOTE  Subjective:  Shannon Myers is a 31 y.o. [phone] at 4842w6d being seen today for ongoing prenatal care.  She is currently monitored for the following issues for this high-risk pregnancy and has Obesity; Supervision of high-risk pregnancy; and History of preterm delivery, currently pregnant on her problem list.  Patient reports no complaints.  Contractions: Not present. Vag. Bleeding: None.   . Denies leaking of fluid.   The following portions of the patient's history were reviewed and updated as appropriate: allergies, current medications, past family history, past medical history, past social history, past surgical history and problem list. Problem list updated.  Objective:   Vitals:   08/27/16 1309  BP: 107/65  Pulse: 85  Weight: 180 lb (81.6 kg)    Fetal Status:           General:  Alert, oriented and cooperative. Patient is in no acute distress.  Skin: Skin is warm and dry. No rash noted.   Cardiovascular: Normal heart rate noted  Respiratory: Normal respiratory effort, no problems with respiration noted  Abdomen: Soft, gravid, appropriate for gestational age. Pain/Pressure: Present     Pelvic:  Cervical exam deferred        Extremities: Normal range of motion.  Edema: Trace  Mental Status: Normal mood and affect. Normal behavior. Normal judgment and thought content.   Assessment and Plan:  Pregnancy: G7P5106 at 7742w6d  1. Supervision of high risk pregnancy, antepartum -doing well  -first trimester screen reviewed and negative -MSAFP was ordered today   2. History of pre-term delivery -makena started today.  Preterm labor symptoms and general obstetric precautions including but not limited to vaginal bleeding, contractions, leaking of fluid and fetal movement were reviewed in detail with the patient. Please refer to After Visit Summary for other counseling recommendations.  No Follow-up on file.   Lorne SkeensNicholas Michael Schenk, MD

## 2016-08-27 NOTE — Addendum Note (Signed)
Addended by: Garret ReddishBARNES, Lucien Budney M on: 08/27/2016 01:51 PM   Modules accepted: Orders

## 2016-08-27 NOTE — Progress Notes (Signed)
Phone interpreter Alecia LemmingVictor [phone]

## 2016-08-27 NOTE — Patient Instructions (Signed)
Hydroxyprogesterone solution for injection Qu es este medicamento? La HIDROXIPROGESTERONA es una hormona femenina. Este medicamento se [place] Islandsutiliza en las mujeres embarazadas que han dado a luz a un beb demasiado pronto (prematuro) en el pasado. Ayuda a reducir Nurse, adultel riesgo de tener un beb prematuro de [place]. Este medicamento puede ser utilizado para otros usos; si tiene alguna pregunta consulte con su proveedor de atencin mdica o con su farmacutico. MARCAS COMUNES: Makena Qu le debo informar a mi profesional de la salud antes de tomar este medicamento? Necesita saber si usted presenta alguno de los siguientes problemas o situaciones: trastornos de Sports administratorcoagulacin cncer de mama, cervical, uterino o vaginal depresin diabetes o prediabetes enfermedad cardiaca alta presin sangunea enfermedad renal enfermedad heptica enfermedad pulmonar o respiratoria, como asma migraas convulsiones sangrado vaginal una reaccin alrgica o inusual a la hidroxiprogesterona, a otras hormonas, medicamentos, alimentos, colorantes, aceite de ricino, alcohol benclico o conservantes si est amamantando a un beb Cmo debo utilizar este medicamento? Este medicamento se administra mediante inyeccin en un msculo. Lo administra un profesional de Radiographer, therapeuticla salud en un hospital o en un entorno clnico. Es posible que reciba una inyeccin una vez por semana para evitar un parto prematuro. Hable con su pediatra para informarse acerca del uso de este medicamento en nios. Puede requerir atencin especial. Sobredosis: Pngase en contacto inmediatamente con un centro toxicolgico o una sala de urgencia si usted cree que haya tomado demasiado medicamento. ATENCIN: Reynolds AmericanEste medicamento es solo para usted. No comparta este medicamento con nadie. Qu sucede si me olvido de una dosis? Es importante no olvidar ninguna dosis. Informe a su mdico o a su profesional de la salud si no puede asistir a Marketing executiveuna cita. Qu puede interactuar con este  medicamento? acetaminofeno bupropion clozapina efavirenz halotano metadona nicotina teofilina, aminofilina tizanidina Puede ser que esta lista no menciona todas las posibles interacciones. Informe a su profesional de Beazer Homesla salud de In[employer] Inctodos los productos a base de hierbas, medicamentos de [place] libre o suplementos nutritivos que est tomando. Si usted fuma, consume bebidas alcohlicas o si utiliza drogas ilegales, indqueselo tambin a su profesional de Beazer Homesla salud. Algunas sustancias pueden interactuar con su medicamento. A qu debo estar atento al usar PPL Corporationeste medicamento? Se supervisar su estado de salud atentamente mientras reciba este medicamento. Qu efectos secundarios puedo tener al Boston Scientificutilizar este medicamento? Efectos secundarios que debe informar a su mdico o a Producer, television/film/videosu profesional de la salud tan pronto como sea posible: Therapist, artreacciones alrgicas como erupcin cutnea, picazn o urticarias, hinchazn de la cara, labios o lengua problemas respiratorios cambios o secrecin de las mamas cambios en la visin confusin, problemas para hablar o comprender humor deprimido aumento de apetito o sed aumento de la necesidad de Development worker, international aidorinar dolor, enrojecimiento o Marketing executiveirritacin en el lugar de Doctor, hospitalla inyeccin dolor, hinchazon, sensacin clida en la pierna falta de aliento, dolor en el pecho, hinchazn de la pierna entumecimiento o debilidad repentina de la cara, brazo o pierna dolores de cabeza repentinas severas dificultad para caminar, mareos, prdida de equilibrio o coordinacin cansancio o debilidad inusual sangrado vaginal color amarillento de los ojos o la piel Efectos secundarios que, por lo general, no requieren atencin mdica (debe informarlos a su mdico o a su profesional de la salud si persisten o si son molestos): cambios de emociones o de humor diarrea retencin de lquidos o hinchazn nuseas Puede ser que esta lista no menciona todos los posibles efectos secundarios. Comunquese a su mdico por asesoramiento mdico Devon Energysobre  los efectos secundarios. Usted puede eBayinformar los efectos  secundarios a la FDA por telfono al 1-800-FDA-1088. Dnde debo guardar mi medicina? Este medicamento se administra en hospitales o clnicas y no necesitar guardarlo en su domicilio. ATENCIN: Este folleto es un resumen. Puede ser que no cubra toda la posible informacin. Si usted tiene preguntas acerca de esta medicina, consulte con su mdico, su farmacutico o su profesional de Radiographer, therapeuticla salud.  2018 Elsevier/Gold Standard (2016-06-18 00:00:00)  Anlisis de alfafetoprotena (Alpha-Fetoprotein Test) POR QU ME DEBO REALIZAR ESTE ANLISIS? Este anlisis se utiliza para la deteccin de defectos congnitos, como anomalas cromosmicas, anomalas congnitas del tubo neural y defectos de la pared abdominal. Tambin puede usarse como un marcador tumoral de ciertos tipos de Database administratorcncer. La alfafetoprotena (AFP) es una protena que fabrica el hgado del feto. Las concentraciones pueden detectarse durante el Psychiatristembarazo, a partir de la semana 10de gestacin, y Writeralcanzan el pico mximo entre la semana16 y la 18de gestacin. El mdico puede realizar este anlisis si est embarazada o si se sospecha la presencia de un tumor. QU TIPO DE MUESTRA SE TOMA? Para este anlisis, se extrae Lauris Poaguna muestra de Spencersangre. Por lo general, para extraerla, se introduce una aguja en una vena. CMO DEBO PREPARARME PARA ESTE ANLISIS? No se requiere preparacin ni tampoco estar en ayunas. QU SON LOS VALORES DE REFERENCIA? Los valores de referencia son los valores saludables establecidos despus de realizarle el anlisis a un grupo grande de personas sanas. Pueden variar American Family Insuranceentre diferentes personas, laboratorios y hospitales. Es su responsabilidad retirar el resultado del Playitaestudio. Consulte en el laboratorio o en el departamento en el que fue realizado el estudio cundo y cmo podr Starbucks Corporationobtener los resultados. Los valores de referencia de la alfafetoprotena son los  siguientes:  Adultos: Menos de 40ng/ml o menos de 5840mcg/l (unidades SI).  Nios menores de 1 ao: Menos de 30ng/ml. Los rangos se estratifican por semanas de gestacin y varan entre los laboratorios. QU SIGNIFICAN LOS RESULTADOS? Los valores por encima de los valores de Mining engineerreferencia en las embarazadas pueden indicar lo siguiente:  Anomalas congnitas del tubo neural.  Defectos de la pared abdominal.  Embarazo mltiple.  Anomalas congnitas.  Distrs o muerte fetal. Los valores por encima de los valores de Mining engineerreferencia en las mujeres que no estn embarazadas pueden indicar lo siguiente:  Tumores malignos del aparato reproductivo.  Cncer de hgado.  Muerte hepatocelular.  Otros tipos de cncer. Los valores por debajo de los valores de Mining engineerreferencia en las embarazadas pueden indicar lo siguiente:  Sndrome de Down.  Muerte fetal. Hable con el mdico [address]sobre los Vilasresultados, las opciones de tratamiento y, si es necesario, la necesidad de Education officer, environmentalrealizar ms [place]estudios. Hable con el mdico si tiene Dynegyalguna pregunta sobre los resultados. Esta informacin no tiene Theme park managercomo fin reemplazar el consejo del mdico. Asegrese de hacerle al mdico cualquier pregunta que tenga. Document Released: 05/04/2012 Document Revised: 06/08/2014 Document Reviewed: 10/20/2013 Elsevier Interactive Patient Education  2017 ArvinMeritorElsevier Inc.

## 2016-09-03 ENCOUNTER — Ambulatory Visit (INDEPENDENT_AMBULATORY_CARE_PROVIDER_SITE_OTHER): Payer: Self-pay

## 2016-09-03 VITALS — BP 124/69 | HR 72

## 2016-09-03 DIAGNOSIS — O09213 Supervision of pregnancy with history of pre-term labor, third trimester: Secondary | ICD-10-CM

## 2016-09-03 DIAGNOSIS — O09899 Supervision of other high risk pregnancies, unspecified trimester: Secondary | ICD-10-CM

## 2016-09-03 DIAGNOSIS — O09219 Supervision of pregnancy with history of pre-term labor, unspecified trimester: Principal | ICD-10-CM

## 2016-09-03 LAB — AFP, SERUM, OPEN SPINA BIFIDA
AFP MOM: 1.25
AFP VALUE AFPOSL: 39.6 ng/mL
Gest. Age on Collection Date: 16.6 weeks
Maternal Age At EDD: 31.2 years
OSBR Risk 1 IN: 5576
TEST RESULTS AFP: NEGATIVE
Weight: 180 [lb_av]

## 2016-09-03 NOTE — Progress Notes (Signed)
Patient presented to office today for her 17p injection. Patient tolerated well and will follow up next week for her next dose. Patient requested we do HR on baby since she has not felt baby today. HR was 161 discuss with provider Dr.Pickens about patient concerns. HR was fine.

## 2016-09-10 ENCOUNTER — Ambulatory Visit (INDEPENDENT_AMBULATORY_CARE_PROVIDER_SITE_OTHER): Payer: Self-pay | Admitting: *Deleted

## 2016-09-10 DIAGNOSIS — O09219 Supervision of pregnancy with history of pre-term labor, unspecified trimester: Principal | ICD-10-CM

## 2016-09-10 DIAGNOSIS — O09899 Supervision of other high risk pregnancies, unspecified trimester: Secondary | ICD-10-CM

## 2016-09-10 DIAGNOSIS — O09212 Supervision of pregnancy with history of pre-term labor, second trimester: Secondary | ICD-10-CM

## 2016-09-17 ENCOUNTER — Ambulatory Visit (INDEPENDENT_AMBULATORY_CARE_PROVIDER_SITE_OTHER): Payer: Self-pay | Admitting: *Deleted

## 2016-09-17 ENCOUNTER — Telehealth: Payer: Self-pay

## 2016-09-17 DIAGNOSIS — O09899 Supervision of other high risk pregnancies, unspecified trimester: Secondary | ICD-10-CM

## 2016-09-17 DIAGNOSIS — O09212 Supervision of pregnancy with history of pre-term labor, second trimester: Secondary | ICD-10-CM

## 2016-09-17 DIAGNOSIS — O09219 Supervision of pregnancy with history of pre-term labor, unspecified trimester: Principal | ICD-10-CM

## 2016-09-17 NOTE — Telephone Encounter (Signed)
Called [facility] to get her refill on Makena delivered. Pharmacy will need to verify her insurance once they do this they will call us back to set up delivery.

## 2016-09-19 ENCOUNTER — Encounter (HOSPITAL_COMMUNITY): Payer: Self-pay | Admitting: *Deleted

## 2016-09-19 ENCOUNTER — Inpatient Hospital Stay (HOSPITAL_COMMUNITY)
Admission: AD | Admit: 2016-09-19 | Discharge: 2016-09-19 | Disposition: A | Discharge status: Home / Self Care | Payer: Self-pay | Source: Ambulatory Visit | Attending: Obstetrics and Gynecology | Admitting: Obstetrics and Gynecology

## 2016-09-19 DIAGNOSIS — R05 Cough: Secondary | ICD-10-CM | POA: Insufficient documentation

## 2016-09-19 DIAGNOSIS — Z833 Family history of diabetes mellitus: Secondary | ICD-10-CM | POA: Insufficient documentation

## 2016-09-19 DIAGNOSIS — R6889 Other general symptoms and signs: Secondary | ICD-10-CM

## 2016-09-19 DIAGNOSIS — Z3A20 20 weeks gestation of pregnancy: Secondary | ICD-10-CM | POA: Insufficient documentation

## 2016-09-19 DIAGNOSIS — O9989 Other specified diseases and conditions complicating pregnancy, childbirth and the puerperium: Secondary | ICD-10-CM | POA: Insufficient documentation

## 2016-09-19 DIAGNOSIS — R509 Fever, unspecified: Secondary | ICD-10-CM | POA: Insufficient documentation

## 2016-09-19 DIAGNOSIS — Z20828 Contact with and (suspected) exposure to other viral communicable diseases: Secondary | ICD-10-CM | POA: Insufficient documentation

## 2016-09-19 DIAGNOSIS — R197 Diarrhea, unspecified: Secondary | ICD-10-CM | POA: Insufficient documentation

## 2016-09-19 DIAGNOSIS — O26892 Other specified pregnancy related conditions, second trimester: Secondary | ICD-10-CM | POA: Insufficient documentation

## 2016-09-19 LAB — URINALYSIS, ROUTINE W REFLEX MICROSCOPIC
Bilirubin Urine: NEGATIVE
GLUCOSE, UA: NEGATIVE mg/dL
Hgb urine dipstick: NEGATIVE
Ketones, ur: NEGATIVE mg/dL
Nitrite: NEGATIVE
Protein, ur: NEGATIVE mg/dL
Specific Gravity, Urine: 1.01 (ref 1.005–1.030)
pH: 7 (ref 5.0–8.0)

## 2016-09-19 LAB — INFLUENZA PANEL BY PCR (TYPE A & B)
Influenza A By PCR: NEGATIVE
Influenza B By PCR: NEGATIVE

## 2016-09-19 MED ORDER — OSELTAMIVIR PHOSPHATE 75 MG PO CAPS: 75.0000 mg | ORAL_CAPSULE | Freq: Two times a day (BID) | ORAL | 0 refills | Status: DC

## 2016-09-19 MED ORDER — ONDANSETRON 4 MG PO TBDP
4.0000 mg | ORAL_TABLET | Freq: Once | ORAL | Status: AC
  Administered 2016-09-19: 4 mg via ORAL
  Filled 2016-09-19: qty 1

## 2016-09-19 MED ORDER — ONDANSETRON 4 MG PO TBDP: 4.0000 mg | ORAL_TABLET | Freq: Three times a day (TID) | ORAL | 0 refills | Status: DC | PRN

## 2016-09-19 NOTE — MAU Provider Note (Signed)
History     CSN: [id number]  Arrival date and time: 09/19/16 1448   First Provider Initiated Contact with Patient 09/19/16 1633      Chief Complaint  Patient presents with  . Cough  . Fever  . Emesis  . Diarrhea   HPI   [name] is a 31 y.o. female [phone] @ [redacted]w[redacted]d here in MAU with several complaints, she is here with cough, and fever. Symptoms started yesterday afternoon. She felt feverish around 1300: she did not take her temperature however did take one tablet of tylenol.  No sick contacts. + N/V/D.  She has had over 10 episodes of diarrhea; has not taken anything for the symptoms.  Patient has a friend who tested positive for the flu this past week. She does not stay in the same house as the friend, just has been around her every day.   OB History    Gravida Para Term Preterm AB Living   SAB TAB Ectopic Multiple Live Births         0 6      Past Medical History:  Diagnosis Date  . Anemia   . Preterm labor   . Vaginal bleeding in pregnancy    Last pregnancy    Past Surgical History:  Procedure Laterality Date  . NO PAST SURGERIES      Family History  Problem Relation Age of Onset  . Diabetes Mother   . Alcohol abuse Neg Hx   . Asthma Neg Hx   . Arthritis Neg Hx   . Birth defects Neg Hx   . Cancer Neg Hx   . COPD Neg Hx   . Depression Neg Hx   . Drug abuse Neg Hx   . Early death Neg Hx   . Hearing loss Neg Hx   . Heart disease Neg Hx   . Hyperlipidemia Neg Hx   . Hypertension Neg Hx   . Kidney disease Neg Hx   . Learning disabilities Neg Hx   . Mental illness Neg Hx   . Mental retardation Neg Hx   . Miscarriages / Stillbirths Neg Hx   . Stroke Neg Hx   . Vision loss Neg Hx     Social History  Substance Use Topics  . Smoking status: Never Smoker  . Smokeless tobacco: Never Used  . Alcohol use No    Allergies: No Known Allergies  Facility-Administered Medications Prior to Admission  Medication Dose Route Frequency  Provider Last Rate Last Dose  . hydroxyprogesterone caproate (MAKENA) 250 mg/mL injection 250 mg  250 mg Intramuscular Weekly Lorne Skeens, MD   250 mg at 09/17/16 1210   Prescriptions Prior to Admission  Medication Sig Dispense Refill Last Dose  . Prenatal Vit-Fe Fumarate-FA (PRENATAL VITAMIN PO) Take by mouth.   Taking   Results for orders placed or performed during the hospital encounter of 09/19/16 (from the past 48 hour(s))  Urinalysis, Routine w reflex microscopic     Status: Abnormal   Collection Time: 09/19/16  3:17 PM  Result Value Ref Range   Color, Urine YELLOW YELLOW   APPearance CLEAR CLEAR   Specific Gravity, Urine 1.010 1.005 - 1.030   pH 7.0 5.0 - 8.0   Glucose, UA NEGATIVE NEGATIVE mg/dL   Hgb urine dipstick NEGATIVE NEGATIVE   Bilirubin Urine NEGATIVE NEGATIVE   Ketones, ur NEGATIVE NEGATIVE mg/dL   Protein, ur NEGATIVE NEGATIVE mg/dL   Nitrite  NEGATIVE NEGATIVE   Leukocytes, UA TRACE (A) NEGATIVE   RBC / HPF 0-5 0 - 5 RBC/hpf   WBC, UA 0-5 0 - 5 WBC/hpf   Bacteria, UA RARE (A) NONE SEEN   Squamous Epithelial / LPF 6-30 (A) NONE SEEN  Influenza panel by PCR (type A & B)     Status: None   Collection Time: 09/19/16  4:49 PM  Result Value Ref Range   Influenza A By PCR NEGATIVE NEGATIVE   Influenza B By PCR NEGATIVE NEGATIVE    Comment: (NOTE) The Xpert Xpress Flu assay is intended as an aid in the diagnosis of  influenza and should not be used as a sole basis for treatment.  This  assay is FDA approved for nasopharyngeal swab specimens only. Nasal  washings and aspirates are unacceptable for Xpert Xpress Flu testing.    Review of Systems  Constitutional: Positive for chills, fatigue and fever.  HENT: Negative for sore throat.   Respiratory: Negative for cough, shortness of breath and wheezing.   Gastrointestinal: Positive for diarrhea (10 times ), nausea and vomiting (4-5 times).   Physical Exam   Blood pressure 121/78, pulse 90, temperature  98.5 F (36.9 C), temperature source Oral, resp. rate 18, height  (1.499 m), weight 181 lb (82.1 kg), last menstrual period 05/01/2016, currently breastfeeding.  Physical Exam  Constitutional: She is oriented to person, place, and time. She appears well-developed and well-nourished.  Non-toxic appearance. She does not have a sickly appearance. She does not appear ill. No distress.  HENT:  Head: Normocephalic.  Eyes: Pupils are equal, round, and reactive to light.  Respiratory: Effort normal.  Musculoskeletal: Normal range of motion.  Neurological: She is alert and oriented to person, place, and time.  Skin: Skin is warm. She is not diaphoretic.  Psychiatric: Her behavior is normal.   MAU Course  Procedures  none  MDM  UA + fetal heart tones via doppler  Flu swab negative, patient is pregnant with direct contact with someone who tested positive recently for influenza. Will treat with Tamiflu.   Assessment and Plan   A:  1. Exposure to influenza   2. Flu-like symptoms   3. Diarrhea, unspecified type     P:  Discharge home in stable condition Rx: Zofran, Tamiflu Return to MAU as needed, if symptoms worsen  Ok to use imodium, as directed on the bottle Cool mist humidifier Follow up with PCP  Duane Lope, NP 09/19/2016 6:20 PM

## 2016-09-19 NOTE — MAU Note (Signed)
Pt reports she started feeling hot like she had a fever (did not take temp) , cough , congestion and had N/V/D. Every time she eats or drinks it goes right thorough her  Or she vomits. c/o abd pain and cramping as well.

## 2016-09-19 NOTE — Discharge Instructions (Signed)

## 2016-09-23 ENCOUNTER — Ambulatory Visit (INDEPENDENT_AMBULATORY_CARE_PROVIDER_SITE_OTHER): Payer: Self-pay | Admitting: Obstetrics and Gynecology

## 2016-09-23 VITALS — BP 109/55 | HR 84 | Wt 183.4 lb

## 2016-09-23 DIAGNOSIS — O09219 Supervision of pregnancy with history of pre-term labor, unspecified trimester: Principal | ICD-10-CM

## 2016-09-23 DIAGNOSIS — O0992 Supervision of high risk pregnancy, unspecified, second trimester: Secondary | ICD-10-CM

## 2016-09-23 DIAGNOSIS — O09212 Supervision of pregnancy with history of pre-term labor, second trimester: Secondary | ICD-10-CM

## 2016-09-23 DIAGNOSIS — O09899 Supervision of other high risk pregnancies, unspecified trimester: Secondary | ICD-10-CM

## 2016-09-23 DIAGNOSIS — J111 Influenza due to unidentified influenza virus with other respiratory manifestations: Secondary | ICD-10-CM

## 2016-09-23 NOTE — Patient Instructions (Signed)

## 2016-09-23 NOTE — Progress Notes (Signed)
Subjective:  Shannon Myers is a 31 y.o. [phone] at [redacted]w[redacted]d being seen today for ongoing prenatal care. She is currently being treated for the flu. Repots Sx are improving.  She is currently monitored for the following issues for this high-risk pregnancy and has Obesity; Supervision of high-risk pregnancy; History of preterm delivery, currently pregnant; and Flu on her problem list.  Patient reports as noted.  Contractions: Not present.  .   . Denies leaking of fluid.   The following portions of the patient's history were reviewed and updated as appropriate: allergies, current medications, past family history, past medical history, past social history, past surgical history and problem list. Problem list updated.  Objective:   Vitals:   09/23/16 1304  BP: (!) 109/55  Pulse: 84  Weight: 183 lb 6.4 oz (83.2 kg)    Fetal Status: Fetal Heart Rate (bpm): 145         General:  Alert, oriented and cooperative. Patient is in no acute distress.  Skin: Skin is warm and dry. No rash noted.   Cardiovascular: Normal heart rate noted  Respiratory: Normal respiratory effort, no problems with respiration noted  Abdomen: Soft, gravid, appropriate for gestational age. Pain/Pressure: Absent     Pelvic:  Cervical exam deferred        Extremities: Normal range of motion.     Mental Status: Normal mood and affect. Normal behavior. Normal judgment and thought content.   Urinalysis:      Assessment and Plan:  Pregnancy: G7P5106 at [redacted]w[redacted]d  1. History of preterm delivery, currently pregnant Continue with Menka  2. Supervision of high risk pregnancy in second trimester Anatomy scan ordered  3. Flu Continue with Tamiflu  Preterm labor symptoms and general obstetric precautions including but not limited to vaginal bleeding, contractions, leaking of fluid and fetal movement were reviewed in detail with the patient. Please refer to After Visit Summary for other counseling recommendations.  No Follow-up  on file.   Hermina Staggers, MD

## 2016-09-29 ENCOUNTER — Other Ambulatory Visit: Payer: Self-pay | Admitting: Obstetrics and Gynecology

## 2016-09-29 ENCOUNTER — Ambulatory Visit (HOSPITAL_COMMUNITY)
Admission: RE | Admit: 2016-09-29 | Discharge: 2016-09-29 | Disposition: A | Discharge status: Home / Self Care | Payer: Self-pay | Source: Ambulatory Visit | Attending: Obstetrics and Gynecology | Admitting: Obstetrics and Gynecology

## 2016-09-29 ENCOUNTER — Ambulatory Visit (INDEPENDENT_AMBULATORY_CARE_PROVIDER_SITE_OTHER): Payer: Self-pay | Admitting: *Deleted

## 2016-09-29 ENCOUNTER — Encounter (HOSPITAL_COMMUNITY): Payer: Self-pay

## 2016-09-29 VITALS — BP 106/57 | HR 95 | Wt 181.5 lb

## 2016-09-29 DIAGNOSIS — O09219 Supervision of pregnancy with history of pre-term labor, unspecified trimester: Principal | ICD-10-CM

## 2016-09-29 DIAGNOSIS — O09899 Supervision of other high risk pregnancies, unspecified trimester: Secondary | ICD-10-CM

## 2016-09-29 DIAGNOSIS — E669 Obesity, unspecified: Secondary | ICD-10-CM | POA: Insufficient documentation

## 2016-09-29 DIAGNOSIS — Z8751 Personal history of pre-term labor: Secondary | ICD-10-CM | POA: Insufficient documentation

## 2016-09-29 DIAGNOSIS — O0992 Supervision of high risk pregnancy, unspecified, second trimester: Secondary | ICD-10-CM | POA: Insufficient documentation

## 2016-09-29 DIAGNOSIS — O99212 Obesity complicating pregnancy, second trimester: Secondary | ICD-10-CM | POA: Insufficient documentation

## 2016-09-29 DIAGNOSIS — Z3A21 21 weeks gestation of pregnancy: Secondary | ICD-10-CM | POA: Insufficient documentation

## 2016-09-29 DIAGNOSIS — Z363 Encounter for antenatal screening for malformations: Secondary | ICD-10-CM | POA: Insufficient documentation

## 2016-09-29 DIAGNOSIS — O09212 Supervision of pregnancy with history of pre-term labor, second trimester: Secondary | ICD-10-CM

## 2016-09-29 NOTE — Progress Notes (Signed)
Makena 250 mg administered as scheduled.  Pt tolerated well.

## 2016-09-30 ENCOUNTER — Other Ambulatory Visit (HOSPITAL_COMMUNITY): Payer: Self-pay | Admitting: *Deleted

## 2016-09-30 DIAGNOSIS — O35EXX0 Maternal care for other (suspected) fetal abnormality and damage, fetal genitourinary anomalies, not applicable or unspecified: Secondary | ICD-10-CM

## 2016-09-30 DIAGNOSIS — O358XX0 Maternal care for other (suspected) fetal abnormality and damage, not applicable or unspecified: Secondary | ICD-10-CM

## 2016-10-07 ENCOUNTER — Ambulatory Visit (INDEPENDENT_AMBULATORY_CARE_PROVIDER_SITE_OTHER): Payer: Self-pay | Admitting: *Deleted

## 2016-10-07 VITALS — BP 107/54 | HR 91 | Wt 183.9 lb

## 2016-10-07 DIAGNOSIS — O09899 Supervision of other high risk pregnancies, unspecified trimester: Secondary | ICD-10-CM

## 2016-10-07 DIAGNOSIS — O09212 Supervision of pregnancy with history of pre-term labor, second trimester: Secondary | ICD-10-CM

## 2016-10-07 DIAGNOSIS — O09219 Supervision of pregnancy with history of pre-term labor, unspecified trimester: Principal | ICD-10-CM

## 2016-10-07 NOTE — Progress Notes (Signed)
[employer] video interpreter Cephus Slaterelly # [id number] used for encounter.  Pt had questions about her US results from last week. All questions answered to her satisfaction.  17P administered as scheduled

## 2016-10-15 ENCOUNTER — Ambulatory Visit: Payer: Self-pay

## 2016-10-16 ENCOUNTER — Ambulatory Visit (INDEPENDENT_AMBULATORY_CARE_PROVIDER_SITE_OTHER): Payer: Self-pay | Admitting: *Deleted

## 2016-10-16 DIAGNOSIS — O09212 Supervision of pregnancy with history of pre-term labor, second trimester: Secondary | ICD-10-CM

## 2016-10-16 DIAGNOSIS — O09899 Supervision of other high risk pregnancies, unspecified trimester: Secondary | ICD-10-CM

## 2016-10-16 DIAGNOSIS — O09219 Supervision of pregnancy with history of pre-term labor, unspecified trimester: Principal | ICD-10-CM

## 2016-10-23 ENCOUNTER — Ambulatory Visit (INDEPENDENT_AMBULATORY_CARE_PROVIDER_SITE_OTHER): Payer: Self-pay | Admitting: Obstetrics and Gynecology

## 2016-10-23 VITALS — BP 111/56 | HR 79 | Wt 185.0 lb

## 2016-10-23 DIAGNOSIS — O0992 Supervision of high risk pregnancy, unspecified, second trimester: Secondary | ICD-10-CM

## 2016-10-23 DIAGNOSIS — O09219 Supervision of pregnancy with history of pre-term labor, unspecified trimester: Secondary | ICD-10-CM

## 2016-10-23 DIAGNOSIS — O09899 Supervision of other high risk pregnancies, unspecified trimester: Secondary | ICD-10-CM

## 2016-10-23 DIAGNOSIS — O09212 Supervision of pregnancy with history of pre-term labor, second trimester: Secondary | ICD-10-CM

## 2016-10-23 NOTE — Patient Instructions (Signed)

## 2016-10-23 NOTE — Progress Notes (Signed)
Patient reports recent increasing in contractions Breastfeeding discussed with patient

## 2016-10-23 NOTE — Progress Notes (Signed)
Subjective:  Shannon Myers is a 31 y.o. [phone] at 153w0d being seen today for ongoing prenatal care.  She is currently monitored for the following issues for this high-risk pregnancy and has Obesity; Supervision of high-risk pregnancy; and History of preterm delivery, currently pregnant on her problem list.  Patient reports occasional contractions.  Contractions: Irritability. Vag. Bleeding: None.  Movement: Present. Denies leaking of fluid.   The following portions of the patient's history were reviewed and updated as appropriate: allergies, current medications, past family history, past medical history, past social history, past surgical history and problem list. Problem list updated.  Objective:   Vitals:   10/23/16 0844  BP: (!) 111/56  Pulse: 79  Weight: 83.9 kg (185 lb)    Fetal Status: Fetal Heart Rate (bpm): 140   Movement: Present     General:  Alert, oriented and cooperative. Patient is in no acute distress.  Skin: Skin is warm and dry. No rash noted.   Cardiovascular: Normal heart rate noted  Respiratory: Normal respiratory effort, no problems with respiration noted  Abdomen: Soft, gravid, appropriate for gestational age. Pain/Pressure: Present     Pelvic:  Cervical exam deferred        Extremities: Normal range of motion.  Edema: None  Mental Status: Normal mood and affect. Normal behavior. Normal judgment and thought content.   Urinalysis:      Assessment and Plan:  Pregnancy: G7P5106 at 5653w0d  1. Supervision of high risk pregnancy in second trimester Stable 28 week labs next visit U/S next week  2. History of preterm delivery, currently pregnant Stable Continue with weekly 17 OHP  Preterm labor symptoms and general obstetric precautions including but not limited to vaginal bleeding, contractions, leaking of fluid and fetal movement were reviewed in detail with the patient. Please refer to After Visit Summary for other counseling recommendations.  Return in  about 3 weeks (around 11/13/2016) for OB visit.   Hermina StaggersErvin, Shedrick Sarli L, MD

## 2016-10-27 ENCOUNTER — Ambulatory Visit (HOSPITAL_COMMUNITY)
Admission: RE | Admit: 2016-10-27 | Discharge: 2016-10-27 | Disposition: A | Discharge status: Home / Self Care | Payer: Self-pay | Source: Ambulatory Visit | Attending: Obstetrics and Gynecology | Admitting: Obstetrics and Gynecology

## 2016-10-27 ENCOUNTER — Encounter (HOSPITAL_COMMUNITY): Payer: Self-pay

## 2016-10-27 ENCOUNTER — Other Ambulatory Visit (HOSPITAL_COMMUNITY): Payer: Self-pay | Admitting: *Deleted

## 2016-10-27 DIAGNOSIS — O0992 Supervision of high risk pregnancy, unspecified, second trimester: Secondary | ICD-10-CM

## 2016-10-27 DIAGNOSIS — O09219 Supervision of pregnancy with history of pre-term labor, unspecified trimester: Secondary | ICD-10-CM | POA: Insufficient documentation

## 2016-10-27 DIAGNOSIS — Z363 Encounter for antenatal screening for malformations: Secondary | ICD-10-CM | POA: Insufficient documentation

## 2016-10-27 DIAGNOSIS — O09299 Supervision of pregnancy with other poor reproductive or obstetric history, unspecified trimester: Secondary | ICD-10-CM

## 2016-10-27 DIAGNOSIS — O358XX0 Maternal care for other (suspected) fetal abnormality and damage, not applicable or unspecified: Secondary | ICD-10-CM | POA: Insufficient documentation

## 2016-10-27 DIAGNOSIS — O99212 Obesity complicating pregnancy, second trimester: Secondary | ICD-10-CM | POA: Insufficient documentation

## 2016-10-27 DIAGNOSIS — O09899 Supervision of other high risk pregnancies, unspecified trimester: Secondary | ICD-10-CM

## 2016-10-27 DIAGNOSIS — O35EXX0 Maternal care for other (suspected) fetal abnormality and damage, fetal genitourinary anomalies, not applicable or unspecified: Secondary | ICD-10-CM

## 2016-10-27 DIAGNOSIS — Z3A25 25 weeks gestation of pregnancy: Secondary | ICD-10-CM | POA: Insufficient documentation

## 2016-11-02 ENCOUNTER — Ambulatory Visit (INDEPENDENT_AMBULATORY_CARE_PROVIDER_SITE_OTHER): Payer: Self-pay | Admitting: *Deleted

## 2016-11-02 VITALS — BP 103/63 | HR 85

## 2016-11-02 DIAGNOSIS — O09219 Supervision of pregnancy with history of pre-term labor, unspecified trimester: Principal | ICD-10-CM

## 2016-11-02 DIAGNOSIS — O09212 Supervision of pregnancy with history of pre-term labor, second trimester: Secondary | ICD-10-CM

## 2016-11-02 DIAGNOSIS — O09899 Supervision of other high risk pregnancies, unspecified trimester: Secondary | ICD-10-CM

## 2016-11-11 ENCOUNTER — Ambulatory Visit (INDEPENDENT_AMBULATORY_CARE_PROVIDER_SITE_OTHER): Payer: Self-pay | Admitting: Obstetrics and Gynecology

## 2016-11-11 VITALS — BP 92/55 | HR 82 | Wt 184.5 lb

## 2016-11-11 DIAGNOSIS — O0990 Supervision of high risk pregnancy, unspecified, unspecified trimester: Secondary | ICD-10-CM

## 2016-11-11 DIAGNOSIS — O0992 Supervision of high risk pregnancy, unspecified, second trimester: Secondary | ICD-10-CM

## 2016-11-11 DIAGNOSIS — O09212 Supervision of pregnancy with history of pre-term labor, second trimester: Secondary | ICD-10-CM

## 2016-11-11 DIAGNOSIS — Z23 Encounter for immunization: Secondary | ICD-10-CM

## 2016-11-11 NOTE — Progress Notes (Signed)
17-p today, 28 week labs/tdap Lars MageJuan, Spanish video interpreter [phone] used for visit

## 2016-11-11 NOTE — Progress Notes (Signed)
   PRENATAL VISIT NOTE  Subjective:  Yarely Suzy Boucharderez Severo is a 31 y.o. [phone] at 5738w5d being seen today for ongoing prenatal care.  She is currently monitored for the following issues for this high-risk pregnancy and has Obesity; Supervision of high-risk pregnancy; and History of preterm delivery, currently pregnant on her problem list.  Patient reports no complaints.  Contractions: Not present. Vag. Bleeding: None.  Movement: Present. Denies leaking of fluid.   The following portions of the patient's history were reviewed and updated as appropriate: allergies, current medications, past family history, past medical history, past social history, past surgical history and problem list. Problem list updated.  Objective:   Vitals:   11/11/16 0835  BP: (!) 92/55  Pulse: 82  Weight: 184 lb 8 oz (83.7 kg)    Fetal Status:     Movement: Present     General:  Alert, oriented and cooperative. Patient is in no acute distress.  Skin: Skin is warm and dry. No rash noted.   Cardiovascular: Normal heart rate noted  Respiratory: Normal respiratory effort, no problems with respiration noted  Abdomen: Soft, gravid, appropriate for gestational age. Pain/Pressure: Present     Pelvic:  Cervical exam deferred        Extremities: Normal range of motion.  Edema: None  Mental Status: Normal mood and affect. Normal behavior. Normal judgment and thought content.   Assessment and Plan:  Pregnancy: G7P5106 at 6838w5d  1. Supervision of high risk pregnancy, antepartum  Patient is stable, continue current care. The following labs were drawn: - Glucose Tolerance, 2 Hours w/1 Hour - CBC - HIV antibody (with reflex) - RPR  2. Need for Tdap vaccination - Tdap vaccine greater than or equal to 7yo IM  Preterm labor symptoms and general obstetric precautions including but not limited to vaginal bleeding, contractions, leaking of fluid and fetal movement were reviewed in detail with the patient. Please refer to  After Visit Summary for other counseling recommendations.  Return in about 1 week (around 11/18/2016) for qwk 17p shot. 2wk rob.   Caryn BeeKeku, Heavenleigh Petruzzi A, Medical Student

## 2016-11-12 LAB — CBC
HEMOGLOBIN: 11.4 g/dL (ref 11.1–15.9)
Hematocrit: 35 % (ref 34.0–46.6)
MCH: 29.7 pg (ref 26.6–33.0)
MCHC: 32.6 g/dL (ref 31.5–35.7)
MCV: 91 fL (ref 79–97)
PLATELETS: 184 10*3/uL (ref 150–379)
RBC: 3.84 x10E6/uL (ref 3.77–5.28)
RDW: 13.8 % (ref 12.3–15.4)
WBC: 8.9 10*3/uL (ref 3.4–10.8)

## 2016-11-12 LAB — HIV ANTIBODY (ROUTINE TESTING W REFLEX): HIV Screen 4th Generation wRfx: NONREACTIVE

## 2016-11-12 LAB — GLUCOSE TOLERANCE, 2 HOURS W/ 1HR
GLUCOSE, FASTING: 80 mg/dL (ref 65–91)
Glucose, 1 hour: 79 mg/dL (ref 65–179)
Glucose, 2 hour: 85 mg/dL (ref 65–152)

## 2016-11-12 LAB — RPR: RPR Ser Ql: NONREACTIVE

## 2016-11-18 ENCOUNTER — Ambulatory Visit (INDEPENDENT_AMBULATORY_CARE_PROVIDER_SITE_OTHER): Payer: Self-pay | Admitting: *Deleted

## 2016-11-18 VITALS — BP 99/47 | HR 82 | Wt 182.7 lb

## 2016-11-18 DIAGNOSIS — O09219 Supervision of pregnancy with history of pre-term labor, unspecified trimester: Principal | ICD-10-CM

## 2016-11-18 DIAGNOSIS — O09899 Supervision of other high risk pregnancies, unspecified trimester: Secondary | ICD-10-CM

## 2016-11-18 DIAGNOSIS — O09213 Supervision of pregnancy with history of pre-term labor, third trimester: Secondary | ICD-10-CM

## 2016-11-18 NOTE — Progress Notes (Signed)
Makena 250 mg IM administered as scheduled. Pt tolerated well.  Next dose scheduled 11/24/16.

## 2016-11-24 ENCOUNTER — Ambulatory Visit (INDEPENDENT_AMBULATORY_CARE_PROVIDER_SITE_OTHER): Payer: Self-pay

## 2016-11-24 DIAGNOSIS — O09219 Supervision of pregnancy with history of pre-term labor, unspecified trimester: Principal | ICD-10-CM

## 2016-11-24 DIAGNOSIS — O09899 Supervision of other high risk pregnancies, unspecified trimester: Secondary | ICD-10-CM

## 2016-11-24 DIAGNOSIS — O09213 Supervision of pregnancy with history of pre-term labor, third trimester: Secondary | ICD-10-CM

## 2016-11-24 NOTE — Progress Notes (Signed)
17-P given today  

## 2016-11-30 ENCOUNTER — Ambulatory Visit (INDEPENDENT_AMBULATORY_CARE_PROVIDER_SITE_OTHER): Payer: Self-pay | Admitting: Family Medicine

## 2016-11-30 VITALS — BP 113/67 | HR 92 | Wt 185.3 lb

## 2016-11-30 DIAGNOSIS — O09219 Supervision of pregnancy with history of pre-term labor, unspecified trimester: Secondary | ICD-10-CM

## 2016-11-30 DIAGNOSIS — O09213 Supervision of pregnancy with history of pre-term labor, third trimester: Secondary | ICD-10-CM

## 2016-11-30 DIAGNOSIS — O0993 Supervision of high risk pregnancy, unspecified, third trimester: Secondary | ICD-10-CM

## 2016-11-30 DIAGNOSIS — O09899 Supervision of other high risk pregnancies, unspecified trimester: Secondary | ICD-10-CM

## 2016-11-30 NOTE — Progress Notes (Signed)
   PRENATAL VISIT NOTE  Subjective:  Shannon Myers is a 31 y.o. [phone] at 4924w3d being seen today for ongoing prenatal care.  She is currently monitored for the following issues for this high-risk pregnancy and has Obesity; Supervision of high-risk pregnancy; and History of preterm delivery, currently pregnant on her problem list.  Patient reports occasional contraction - one every 1-2 hours..  Contractions: Irritability. Vag. Bleeding: None.  Movement: Present. Denies leaking of fluid.   The following portions of the patient's history were reviewed and updated as appropriate: allergies, current medications, past family history, past medical history, past social history, past surgical history and problem list. Problem list updated.  Objective:   Vitals:   11/30/16 1515  BP: 113/67  Pulse: 92  Weight: 185 lb 4.8 oz (84.1 kg)    Fetal Status: Fetal Heart Rate (bpm): 135   Movement: Present     General:  Alert, oriented and cooperative. Patient is in no acute distress.  Skin: Skin is warm and dry. No rash noted.   Cardiovascular: Normal heart rate noted  Respiratory: Normal respiratory effort, no problems with respiration noted  Abdomen: Soft, gravid, appropriate for gestational age. Pain/Pressure: Present     Pelvic:  Cervical exam deferred        Extremities: Normal range of motion.  Edema: Trace  Mental Status: Normal mood and affect. Normal behavior. Normal judgment and thought content.   Assessment and Plan:  Pregnancy: G7P5106 at 7824w3d  1. Supervision of high risk pregnancy in third trimester FHT and FH normal.  2. History of preterm delivery, currently pregnant Continue makena  Preterm labor symptoms and general obstetric precautions including but not limited to vaginal bleeding, contractions, leaking of fluid and fetal movement were reviewed in detail with the patient. Please refer to After Visit Summary for other counseling recommendations.  No Follow-up on  file.   Levie HeritageJacob J Stinson, DO

## 2016-11-30 NOTE — Progress Notes (Signed)
[employer] interpreter Dois DavenportSandra [phone]

## 2016-12-07 ENCOUNTER — Other Ambulatory Visit (HOSPITAL_COMMUNITY): Payer: Self-pay | Admitting: Maternal and Fetal Medicine

## 2016-12-07 ENCOUNTER — Ambulatory Visit (HOSPITAL_COMMUNITY)
Admission: RE | Admit: 2016-12-07 | Discharge: 2016-12-07 | Disposition: A | Discharge status: Home / Self Care | Payer: Self-pay | Source: Ambulatory Visit | Attending: Obstetrics and Gynecology | Admitting: Obstetrics and Gynecology

## 2016-12-07 ENCOUNTER — Encounter (HOSPITAL_COMMUNITY): Payer: Self-pay

## 2016-12-07 DIAGNOSIS — O99213 Obesity complicating pregnancy, third trimester: Secondary | ICD-10-CM | POA: Insufficient documentation

## 2016-12-07 DIAGNOSIS — O09299 Supervision of pregnancy with other poor reproductive or obstetric history, unspecified trimester: Secondary | ICD-10-CM

## 2016-12-07 DIAGNOSIS — Z3A31 31 weeks gestation of pregnancy: Secondary | ICD-10-CM

## 2016-12-10 ENCOUNTER — Ambulatory Visit (INDEPENDENT_AMBULATORY_CARE_PROVIDER_SITE_OTHER): Payer: Self-pay

## 2016-12-10 VITALS — BP 97/53 | HR 81

## 2016-12-10 DIAGNOSIS — O0990 Supervision of high risk pregnancy, unspecified, unspecified trimester: Secondary | ICD-10-CM

## 2016-12-10 DIAGNOSIS — O09213 Supervision of pregnancy with history of pre-term labor, third trimester: Secondary | ICD-10-CM

## 2016-12-10 NOTE — Progress Notes (Signed)
17-p injections

## 2016-12-14 ENCOUNTER — Ambulatory Visit (INDEPENDENT_AMBULATORY_CARE_PROVIDER_SITE_OTHER): Payer: Self-pay | Admitting: Obstetrics and Gynecology

## 2016-12-14 VITALS — BP 109/60 | HR 88 | Wt 183.0 lb

## 2016-12-14 DIAGNOSIS — O09899 Supervision of other high risk pregnancies, unspecified trimester: Secondary | ICD-10-CM

## 2016-12-14 DIAGNOSIS — O09213 Supervision of pregnancy with history of pre-term labor, third trimester: Secondary | ICD-10-CM

## 2016-12-14 DIAGNOSIS — O09219 Supervision of pregnancy with history of pre-term labor, unspecified trimester: Principal | ICD-10-CM

## 2016-12-14 DIAGNOSIS — O0993 Supervision of high risk pregnancy, unspecified, third trimester: Secondary | ICD-10-CM

## 2016-12-14 NOTE — Progress Notes (Signed)
Subjective:  Shannon Myers is a 31 y.o. [phone] at 247w3d being seen today for ongoing prenatal care.  She is currently monitored for the following issues for this high-risk pregnancy and has Obesity; Supervision of high-risk pregnancy; and History of preterm delivery, currently pregnant on her problem list.  Patient reports occasional contractions since Sat. Had IC on Friday.   Contractions: Irregular. Vag. Bleeding: None.  Movement: Present. Denies leaking of fluid.   The following portions of the patient's history were reviewed and updated as appropriate: allergies, current medications, past family history, past medical history, past social history, past surgical history and problem list. Problem list updated.  Objective:   Vitals:   12/14/16 1539  BP: 109/60  Pulse: 88  Weight: 183 lb (83 kg)    Fetal Status: Fetal Heart Rate (bpm): 136 Fundal Height: 33 cm Movement: Present     General:  Alert, oriented and cooperative. Patient is in no acute distress.  Skin: Skin is warm and dry. No rash noted.   Cardiovascular: Normal heart rate noted  Respiratory: Normal respiratory effort, no problems with respiration noted  Abdomen: Soft, gravid, appropriate for gestational age. Pain/Pressure: Present     Pelvic:  Cervical exam performed        Extremities: Normal range of motion.  Edema: None  Mental Status: Normal mood and affect. Normal behavior. Normal judgment and thought content.   Urinalysis:      Assessment and Plan:  Pregnancy: G7P5106 at 4047w3d  1. History of preterm delivery, currently pregnant Cervical exam, closed Continue with 17 OHP weekly  2. Supervision of high risk pregnancy in third trimester Stable U/S 12/07/16 1809 gm, 4 #, 60%  Preterm labor symptoms and general obstetric precautions including but not limited to vaginal bleeding, contractions, leaking of fluid and fetal movement were reviewed in detail with the patient. Please refer to After Visit Summary for  other counseling recommendations.  Return in about 2 weeks (around 12/28/2016) for OB visit.   Hermina StaggersErvin, Bernetha Anschutz L, MD

## 2016-12-22 ENCOUNTER — Ambulatory Visit (INDEPENDENT_AMBULATORY_CARE_PROVIDER_SITE_OTHER): Payer: Self-pay | Admitting: General Practice

## 2016-12-22 VITALS — BP 100/55 | HR 87 | Ht <= 58 in | Wt 186.5 lb

## 2016-12-22 DIAGNOSIS — O09899 Supervision of other high risk pregnancies, unspecified trimester: Secondary | ICD-10-CM

## 2016-12-22 DIAGNOSIS — O09213 Supervision of pregnancy with history of pre-term labor, third trimester: Secondary | ICD-10-CM

## 2016-12-22 DIAGNOSIS — O09219 Supervision of pregnancy with history of pre-term labor, unspecified trimester: Principal | ICD-10-CM

## 2016-12-30 ENCOUNTER — Ambulatory Visit (INDEPENDENT_AMBULATORY_CARE_PROVIDER_SITE_OTHER): Payer: Self-pay | Admitting: Obstetrics and Gynecology

## 2016-12-30 VITALS — BP 105/67 | HR 91 | Wt 185.4 lb

## 2016-12-30 DIAGNOSIS — O09899 Supervision of other high risk pregnancies, unspecified trimester: Secondary | ICD-10-CM

## 2016-12-30 DIAGNOSIS — O09213 Supervision of pregnancy with history of pre-term labor, third trimester: Secondary | ICD-10-CM

## 2016-12-30 DIAGNOSIS — O0993 Supervision of high risk pregnancy, unspecified, third trimester: Secondary | ICD-10-CM

## 2016-12-30 DIAGNOSIS — Z3009 Encounter for other general counseling and advice on contraception: Secondary | ICD-10-CM | POA: Insufficient documentation

## 2016-12-30 DIAGNOSIS — O09219 Supervision of pregnancy with history of pre-term labor, unspecified trimester: Secondary | ICD-10-CM

## 2016-12-30 NOTE — Progress Notes (Signed)
Subjective:  Shannon Myers is a 31 y.o. [phone] at 7172w5d being seen today for ongoing prenatal care.  She is currently monitored for the following issues for this high-risk pregnancy and has Obesity; Supervision of high-risk pregnancy; History of preterm delivery, currently pregnant; and Unwanted fertility on her problem list.  Patient reports occasional contractions.  Contractions: Irregular. Vag. Bleeding: None.  Movement: Present. Denies leaking of fluid.   The following portions of the patient's history were reviewed and updated as appropriate: allergies, current medications, past family history, past medical history, past social history, past surgical history and problem list. Problem list updated.  Objective:   Vitals:   12/30/16 1007  BP: 105/67  Pulse: 91  Weight: 185 lb 6.4 oz (84.1 kg)    Fetal Status: Fetal Heart Rate (bpm): 142   Movement: Present     General:  Alert, oriented and cooperative. Patient is in no acute distress.  Skin: Skin is warm and dry. No rash noted.   Cardiovascular: Normal heart rate noted  Respiratory: Normal respiratory effort, no problems with respiration noted  Abdomen: Soft, gravid, appropriate for gestational age. Pain/Pressure: Present     Pelvic:  Cervical exam performed        Extremities: Normal range of motion.  Edema: None  Mental Status: Normal mood and affect. Normal behavior. Normal judgment and thought content.   Urinalysis:      Assessment and Plan:  Pregnancy: G7P5106 at 3872w5d  1. Supervision of high risk pregnancy in third trimester Stable  2. History of preterm delivery, currently pregnant Continue with weekly 17 OHP  3. Unwanted fertility Unfortunately does not have any payment method. Discussed payment with pt, states will discuss with husband  Preterm labor symptoms and general obstetric precautions including but not limited to vaginal bleeding, contractions, leaking of fluid and fetal movement were reviewed in  detail with the patient. Please refer to After Visit Summary for other counseling recommendations.  No Follow-up on file.   Hermina StaggersErvin, Michael L, MD

## 2016-12-30 NOTE — Progress Notes (Signed)
Patient reports contractions that are painful every 5 minutes for past 3 days.

## 2017-01-01 ENCOUNTER — Encounter (HOSPITAL_COMMUNITY): Payer: Self-pay | Admitting: *Deleted

## 2017-01-01 ENCOUNTER — Inpatient Hospital Stay (HOSPITAL_COMMUNITY)
Admission: AD | Admit: 2017-01-01 | Discharge: 2017-01-01 | Disposition: A | Discharge status: Home / Self Care | Payer: Self-pay | Source: Ambulatory Visit | Attending: Obstetrics & Gynecology | Admitting: Obstetrics & Gynecology

## 2017-01-01 DIAGNOSIS — O4703 False labor before 37 completed weeks of gestation, third trimester: Secondary | ICD-10-CM

## 2017-01-01 DIAGNOSIS — Z3A35 35 weeks gestation of pregnancy: Secondary | ICD-10-CM | POA: Insufficient documentation

## 2017-01-01 LAB — URINALYSIS, ROUTINE W REFLEX MICROSCOPIC
BILIRUBIN URINE: NEGATIVE
Glucose, UA: NEGATIVE mg/dL
HGB URINE DIPSTICK: NEGATIVE
Ketones, ur: 20 mg/dL — AB
Leukocytes, UA: NEGATIVE
Nitrite: NEGATIVE
PH: 5 (ref 5.0–8.0)
Protein, ur: NEGATIVE mg/dL
SPECIFIC GRAVITY, URINE: 1.019 (ref 1.005–1.030)

## 2017-01-01 MED ORDER — OXYCODONE-ACETAMINOPHEN 5-325 MG PO TABS
2.0000 | ORAL_TABLET | Freq: Once | ORAL | Status: AC
  Administered 2017-01-01: 2 via ORAL
  Filled 2017-01-01: qty 2

## 2017-01-01 MED ORDER — BETAMETHASONE SOD PHOS & ACET 6 (3-3) MG/ML IJ SUSP
12.0000 mg | Freq: Once | INTRAMUSCULAR | Status: AC
  Administered 2017-01-01: 12 mg via INTRAMUSCULAR
  Filled 2017-01-01: qty 2

## 2017-01-01 NOTE — MAU Note (Signed)
Pt signed AVS signature pad for D/C then provider requested pt receive a dose of betamethasone before D/C. Pt will receive medication and then be discharged after.

## 2017-01-01 NOTE — MAU Note (Signed)
I have communicated with Venia CarbonJennifer Rasch NP and reviewed vital signs:  Vitals:   01/01/17 2250 01/01/17 2338  BP: 111/74 110/73  Pulse: 77 75  Resp:  16  Temp:  97.7 F (36.5 C)    Vaginal exam:  Dilation: 1.5 Effacement (%): 50 Station: -3 Presentation: Vertex Exam by:: jennifer rasch np,   Also reviewed contraction pattern and that non-stress test is reactive.  It has been documented that patient is contracting every 2-8 minutes with no cervical change over 1hour and 44min not indicating active labor.  Patient denies any other complaints.  Based on this report provider has given order for discharge.  A discharge order and diagnosis entered by a provider.   Labor discharge instructions reviewed with patient.

## 2017-01-01 NOTE — MAU Note (Signed)
ctxs for an hour. Some mucousy d/c. No bleeding.

## 2017-01-01 NOTE — MAU Provider Note (Signed)
History     CSN: [id number]  Arrival date and time: 01/01/17 2018   None     Chief Complaint  Patient presents with  . Contractions   HPI   [name] is a 31 y.o. female 917w0d here in MAU with contractions. States the contractions started 1.5 hours ago. She feels the contractions every 5 minuets. History of one preterm delivery @ [redacted] weeks gestation.  No bleeding, + fetal movement.  [facility]   OB History    Gravida Para Term Preterm AB Living   7 6 5 1   6    SAB TAB Ectopic Multiple Live Births         0 6      Past Medical History:  Diagnosis Date  . Anemia   . Preterm labor   . Vaginal bleeding in pregnancy    Last pregnancy    Past Surgical History:  Procedure Laterality Date  . NO PAST SURGERIES      Family History  Problem Relation Age of Onset  . Diabetes Mother   . Alcohol abuse Neg Hx   . Asthma Neg Hx   . Arthritis Neg Hx   . Birth defects Neg Hx   . Cancer Neg Hx   . COPD Neg Hx   . Depression Neg Hx   . Drug abuse Neg Hx   . Early death Neg Hx   . Hearing loss Neg Hx   . Heart disease Neg Hx   . Hyperlipidemia Neg Hx   . Hypertension Neg Hx   . Kidney disease Neg Hx   . Learning disabilities Neg Hx   . Mental illness Neg Hx   . Mental retardation Neg Hx   . Miscarriages / Stillbirths Neg Hx   . Stroke Neg Hx   . Vision loss Neg Hx     Social History  Substance Use Topics  . Smoking status: Never Smoker  . Smokeless tobacco: Never Used  . Alcohol use No    Allergies: No Known Allergies  Facility-Administered Medications Prior to Admission  Medication Dose Route Frequency Provider Last Rate Last Dose  . hydroxyprogesterone caproate (MAKENA) 250 mg/mL injection 250 mg  250 mg Intramuscular Weekly Lorne SkeensSchenk, Nicholas Michael, MD   250 mg at 12/30/16 1018   Prescriptions Prior to Admission  Medication Sig Dispense Refill Last Dose  . Prenatal Vit-Fe Fumarate-FA (PRENATAL VITAMIN PO) Take by mouth.    01/01/2017 at Unknown time  . Acetaminophen (TYLENOL PO) Take by mouth.   Unknown at Unknown time   Results for orders placed or performed during the hospital encounter of 01/01/17 (from the past 48 hour(s))  Urinalysis, Routine w reflex microscopic     Status: Abnormal   Collection Time: 01/01/17  8:31 PM  Result Value Ref Range   Color, Urine YELLOW YELLOW   APPearance CLEAR CLEAR   Specific Gravity, Urine 1.019 1.005 - 1.030   pH 5.0 5.0 - 8.0   Glucose, UA NEGATIVE NEGATIVE mg/dL   Hgb urine dipstick NEGATIVE NEGATIVE   Bilirubin Urine NEGATIVE NEGATIVE   Ketones, ur 20 (A) NEGATIVE mg/dL   Protein, ur NEGATIVE NEGATIVE mg/dL   Nitrite NEGATIVE NEGATIVE   Leukocytes, UA NEGATIVE NEGATIVE    Review of Systems  Gastrointestinal: Positive for abdominal pain.  Genitourinary: Negative for dysuria and vaginal bleeding.   Physical Exam   Blood pressure 112/67, pulse 89, temperature 98.2 F (36.8 C), resp. rate 20, height 4'  10" (1.473 m), weight 186 lb (84.4 kg), last menstrual period 05/01/2016, currently breastfeeding.  Physical Exam  Constitutional: She is oriented to person, place, and time. She appears well-developed and well-nourished. No distress.  HENT:  Head: Normocephalic.  Eyes: Pupils are equal, round, and reactive to light.  GI: Soft.  Genitourinary:  Genitourinary Comments: Dilation: 1.5 Effacement (%): 50 Station: -3 Presentation: Vertex Exam by:: Shannon Sookram np  Musculoskeletal: Normal range of motion.  Neurological: She is alert and oriented to person, place, and time.  Skin: Skin is warm. She is not diaphoretic.  Psychiatric: Her behavior is normal.   Fetal Tracing: Baseline: 120 bpm Variability: moderate  Accelerations: 15x15 Decelerations: none Toco: Q2-4  MAU Course  Procedures  None  MDM  Cervix unchanged after 2 hours in the MAU  Dilation: 1.5 Effacement (%): 50 Station: -3 Presentation: Vertex Exam by:: Shannon Stabenow np  Patient  contraction every 2-4 mins.  Percocet given 2 tabs and states her pain has decreased since medication Betamethasone given.   Myers liter of PO fluid given> 20 ketones in urine.  Pain down to 3/10 from 6/10; contractions less following oral hydration. Patient lives close to the hospital   Assessment and Plan   A:  1. Threatened premature labor in third trimester     P:  Discharge home in stable condition Strict return precautions Return to MAU tomorrow evening for 2nd betamethasone  Preterm labor precautions  Shannon Myers, Shannon RutherfordJennifer I, NP 01/02/2017 12:20 AM

## 2017-01-01 NOTE — Discharge Instructions (Signed)
Informacin sobre el parto prematuro  (Preterm Labor Information)  Se llama parto prematuro cuando se inicia antes de las 37 semanas de embarazo. La duracin de un embarazo normal es de 39 a 41 semanas.  CAUSAS  Generalmente las causas del parto prematuro no se conocen. La causa ms frecuente conocida es una infeccin.  FACTORES DE RIESGO   Historia previa de parto prematuro.  Romper la bolsa de aguas antes de tiempo.  La placenta cubre la abertura del cuello.  La placenta se despega del tero.  El cuello es demasiado dbil para contener al beb en el tero.  Hay mucho lquido en el saco amnitico.  Consumo de drogas o hbito de fumar durante el embarazo.  No aumentar de peso lo suficiente durante el embarazo.  Mujeres menores de 18 aos o mayores de 35 aos.  Tener bajos ingresos.  Pertenecer a la raza afroamericana. SNTOMAS   Clicos similares a los menstruales, dolor en el vientre (abdominal) o dolor en la espalda.  Contracciones regulares, tan frecuentes como seis en una hora. Pueden ser suaves o dolorosas.  Contracciones que comienzan en la parte superior del vientre. Luego bajan hacia la zona inferior del vientre y hacia la espalda.  Presin en la zona inferior del vientre que parece empeorar.  Sangrado que proviene de la vagina.  Prdida de lquido por la vagina. TRATAMIENTO  El tratamiento depende de:   Su estado.  El estado del beb.  Cuntas semanas tiene de embarazo. El mdico podr indicarle:   Medicamentos para detener las contracciones.  Que permanezca en la cama excepto para ir al bao (reposo en cama).  Que permanezca en el hospital. QU DEBE HACER SI PIENSA QUE EST EN TRABAJO DE PARTO PREMATURO?  Comunquese con su mdico de inmediato. Debe concurrir al hospital para ser controlada inmediatamente.  CMO PUEDE EVITAR EL TRABAJO DE PARTO PREMATURO EN FUTUROS EMBARAZOS?   Si fuma, abandone el hbito.  Mantenga un aumento de peso  saludable.  Notome drogas ni manipule sustancias qumicas que no necesita.  Informe a su mdico si piensa que tiene una infeccin.  Informe a su mdico si tuvo un trabajo de parto prematuro anteriormente. Esta informacin no tiene como fin reemplazar el consejo del mdico. Asegrese de hacerle al mdico cualquier pregunta que tenga. Document Released: 06/20/2010 Document Revised: 01/18/2013 Elsevier Interactive Patient Education  2017 Elsevier Inc.  

## 2017-01-02 ENCOUNTER — Inpatient Hospital Stay (HOSPITAL_COMMUNITY)
Admission: AD | Admit: 2017-01-02 | Discharge: 2017-01-02 | Disposition: A | Discharge status: Home / Self Care | Payer: Self-pay | Source: Ambulatory Visit | Attending: Obstetrics and Gynecology | Admitting: Obstetrics and Gynecology

## 2017-01-02 DIAGNOSIS — O09899 Supervision of other high risk pregnancies, unspecified trimester: Secondary | ICD-10-CM

## 2017-01-02 DIAGNOSIS — Z3A36 36 weeks gestation of pregnancy: Secondary | ICD-10-CM | POA: Insufficient documentation

## 2017-01-02 DIAGNOSIS — O09219 Supervision of pregnancy with history of pre-term labor, unspecified trimester: Secondary | ICD-10-CM

## 2017-01-02 DIAGNOSIS — O0993 Supervision of high risk pregnancy, unspecified, third trimester: Secondary | ICD-10-CM

## 2017-01-02 MED ORDER — BETAMETHASONE SOD PHOS & ACET 6 (3-3) MG/ML IJ SUSP
12.0000 mg | Freq: Once | INTRAMUSCULAR | Status: AC
Start: 2017-01-02 — End: 2017-01-02
  Administered 2017-01-02: 12 mg via INTRAMUSCULAR
  Filled 2017-01-02: qty 2

## 2017-01-02 NOTE — Progress Notes (Signed)
Understands to wait in lobby 20mins after receiving BMZ and then may go home. To keep scheduled appt at clinic on Friday 8/10

## 2017-01-02 NOTE — MAU Note (Signed)
Pt here for second BMZ injections. Denies any problems.

## 2017-01-04 ENCOUNTER — Encounter (HOSPITAL_COMMUNITY): Payer: Self-pay | Admitting: *Deleted

## 2017-01-04 ENCOUNTER — Inpatient Hospital Stay (HOSPITAL_COMMUNITY)
Admission: AD | Admit: 2017-01-04 | Discharge: 2017-01-04 | Disposition: A | Discharge status: Home / Self Care | Payer: Self-pay | Source: Ambulatory Visit | Attending: Obstetrics & Gynecology | Admitting: Obstetrics & Gynecology

## 2017-01-04 DIAGNOSIS — O09899 Supervision of other high risk pregnancies, unspecified trimester: Secondary | ICD-10-CM

## 2017-01-04 DIAGNOSIS — O09219 Supervision of pregnancy with history of pre-term labor, unspecified trimester: Secondary | ICD-10-CM

## 2017-01-04 DIAGNOSIS — O09213 Supervision of pregnancy with history of pre-term labor, third trimester: Secondary | ICD-10-CM

## 2017-01-04 DIAGNOSIS — O4703 False labor before 37 completed weeks of gestation, third trimester: Secondary | ICD-10-CM

## 2017-01-04 DIAGNOSIS — O479 False labor, unspecified: Secondary | ICD-10-CM

## 2017-01-04 DIAGNOSIS — Z3A35 35 weeks gestation of pregnancy: Secondary | ICD-10-CM | POA: Insufficient documentation

## 2017-01-04 NOTE — Discharge Instructions (Signed)
Contracciones de Designer, multimediaBraxton Hicks (Braxton Hicks Contractions) Durante el Bensonembarazo, pueden presentarse contracciones uterinas que no siempre indican que est en Corazintrabajo de parto. QU SON LAS CONTRACCIONES DE BRAXTON HICKS? Las State Farmcontracciones que se presentan antes del Walshtrabajo de McConnellstownparto se conocen como contracciones de SavageBraxton Hicks o falso trabajo de Bella Villaparto. Hacia el final del embarazo (32 a 34semanas), estas contracciones pueden aparecen con ms frecuencia y volverse ms intensas. No corresponden al Aleen Campitrabajo de parto verdadero porque estas contracciones no producen el agrandamiento (la dilatacin) y el afinamiento del cuello del tero. Algunas veces, es difcil distinguirlas del trabajo de parto verdadero porque en algunos casos pueden ser D.R. Horton, Incmuy intensas, y las personas tienen diferentes niveles de tolerancia al Merck & Codolor. No debe sentirse avergonzada si concurre al hospital con falso trabajo de [place]parto. En ocasiones, la nica forma de saber si el trabajo de parto es verdadero es que el mdico determine si hay cambios en el cuello del tero. Si no hay problemas prenatales u otras complicaciones de salud asociadas con el embarazo, no habr inconvenientes si la envan a su casa con falso trabajo de parto y espera que comience el verdadero. CMO DIFERENCIAR EL TRABAJO DE PARTO FALSO DEL VERDADERO Falso trabajo de parto  Las contracciones del falso trabajo de parto duran menos y no son tan intensas como las verdaderas.  Generalmente son irregulares.  A menudo, se sienten en la parte delantera de la parte baja del abdomen y en la ingle,  y pueden desaparecer cuando camina o cambia de posicin mientras est acostada.  Las contracciones se vuelven ms dbiles y su duracin es Adult nursemenor a medida que el tiempo transcurre.  Por lo general, no se hacen progresivamente ms intensas, regulares y Herbalistcercanas entre s como en el caso del Midvaletrabajo de parto verdadero. Theodis BlazeVerdadero trabajo de parto  Las contracciones del verdadero  trabajo de parto duran de 30 a 70segundos, son muy regulares y suelen volverse ms intensas, y Lesothoaumenta su frecuencia.  No desaparecen cuando camina.  La molestia generalmente se siente en la parte superior del tero y se extiende hacia la zona inferior del abdomen y Parker Hannifinhacia la cintura.  El mdico podr examinarla para determinar si el trabajo de parto es verdadero. El examen mostrar si el cuello del tero se est dilatando y Laplaceafinando. LO QUE DEBE RECORDAR  Contine haciendo los ejercicios habituales y siga otras indicaciones que el mdico le d.  Tome todos los medicamentos como le indic el mdico.  OceanographerConcurra a las visitas prenatales regulares.  Coma y beba con moderacin si cree que est en trabajo de parto.  Si las contracciones de Dole FoodBraxton Hicks le provocan incomodidad: ? Cambie de posicin: si est acostada o descansando, camine; si est caminando, descanse. ? Sintese y descanse en una baera con agua tibia. ? Beba 2 o 3vasos de agua. La deshidratacin puede provocar contracciones. ? Respire lenta y profundamente varias veces por hora.  CUNDO DEBO BUSCAR ASISTENCIA MDICA INMEDIATA? Solicite atencin mdica de inmediato si:  Las contracciones se intensifican, se hacen ms regulares y Arboriculturistcercanas entre s.  Tiene una prdida de lquido por la vagina.  Tiene fiebre.  Elimina mucosidad manchada con Fordsvillesangre.  Tiene una hemorragia vaginal abundante.  Tiene dolor abdominal permanente.  Tiene un dolor en la zona lumbar que nunca tuvo antes.  Siente que la cabeza del beb empuja hacia abajo y ejerce presin en la zona plvica.  El beb no se mueve Dentisttanto como sola. Esta informacin no tiene Theme park managercomo fin reemplazar el consejo  del mdico. Asegrese de hacerle al mdico cualquier pregunta que tenga. Document Released: 02/25/2005 Document Revised: 09/09/2015 Document Reviewed: 02/27/2013 Elsevier Interactive Patient Education  2017 Elsevier Inc.  Systems analystTercer trimestre de  Psychiatristembarazo (Third Trimester of Pregnancy) El tercer trimestre comprende desde la semana29 hasta la semana42, es decir, desde el mes7 hasta el [address]9. En este trimestre, el feto crece muy rpido. Hacia el final del noveno mes, el feto mide alrededor de 20pulgadas (45cm) de largo y pesa entre 6y 10libras [phone](2,700y 4,500kg). CUIDADOS EN EL HOGAR  No fume, no consuma hierbas ni beba alcohol. No tome frmacos que el mdico no haya autorizado.  No consuma ningn producto que contenga tabaco, lo que incluye cigarrillos, tabaco de Theatre managermascar o Administrator, Civil Servicecigarrillos electrnicos. Si necesita ayuda para dejar de fumar, consulte al American Expressmdico. Puede recibir asesoramiento u otro tipo de apoyo para dejar de fumar.  Tome los medicamentos solamente como se lo haya indicado el mdico. Algunos medicamentos son seguros para tomar durante el Psychiatristembarazo y otros no lo son.  Haga ejercicios solamente como se lo haya indicado el mdico. Interrumpa la actividad fsica si comienza a tener calambres.  Ingiera alimentos saludables de Prairie Farmmanera regular.  Use un sostn que le brinde buen soporte si sus mamas estn sensibles.  No se d baos de inmersin en agua caliente, baos turcos ni saunas.  Colquese el cinturn de seguridad cuando conduzca.  No coma carne cruda ni queso sin cocinar; evite el contacto con las bandejas sanitarias de los gatos y la tierra que estos animales usan.  Tome las vitaminas prenatales.  Tome entre 1500 y 2000mg  de calcio diariamente comenzando en la semana20 del embarazo [place] Josehasta el parto.  Pruebe tomar un medicamento que la ayude a defecar (un laxante suave) si el mdico lo autoriza. Consuma ms fibra, que se encuentra en las frutas y verduras frescas y los cereales integrales. Beba suficiente lquido para mantener el pis (orina) claro o de color amarillo plido.  Dese baos de asiento con agua tibia para Engineer, materialsaliviar el dolor o las molestias causadas por las hemorroides. Use una crema para las hemorroides si  el mdico la autoriza.  Si se le hinchan las venas (venas varicosas), use medias de descanso. Levante (eleve) los pies durante 15minutos, 3 o 4veces por Futures traderda. Limite el consumo de sal en su dieta.  No levante objetos pesados, use zapatos de tacones bajos y sintese derecha.  Descanse con las piernas elevadas si tiene calambres o dolor de cintura.  Visite a su dentista si no lo ha Occupational hygienisthecho durante el embarazo. Use un cepillo de cerdas suaves para cepillarse los dientes. Psese el hilo dental con suavidad.  Puede seguir Calpine Corporationmanteniendo relaciones sexuales, a menos que el mdico le indique lo contrario.  No haga viajes de larga distancia, excepto si es obligatorio y solamente con la aprobacin del mdico.  Tome clases prenatales.  Practique ir manejando al hospital.  Prepare el bolso que llevar al hospital.  Prepare la habitacin del beb.  Concurra a los controles mdicos.  SOLICITE AYUDA SI:  No est segura de si est en trabajo de parto o si ha roto la bolsa de las aguas.  Tiene mareos.  Siente calambres leves o presin en la parte inferior del abdomen.  Sufre un dolor persistente en el abdomen.  Tiene Programme researcher, broadcasting/film/videomalestar estomacal (nuseas), vmitos, o tiene deposiciones acuosas (diarrea).  Advierte un olor ftido que proviene de la vagina.  Siente dolor al ConocoPhillipsorinar.  SOLICITE AYUDA DE INMEDIATO SI:  Tiene fiebre.  Shelle Ironiene una  prdida de lquido por la vagina.  Tiene sangrado o pequeas prdidas vaginales.  Siente dolor intenso o clicos en el abdomen.  Sube o baja de peso rpidamente.  Tiene dificultades para recuperar el aliento y siente dolor en el pecho.  Sbitamente se le hinchan mucho el rostro, las [place], los tobillos, los pies o las piernas.  No ha sentido los movimientos del beb durante Georgianne Fick.  Siente un dolor de cabeza intenso que no se alivia con medicamentos.  Su visin se modifica.  Esta informacin no tiene Theme park manager el consejo del mdico.  Asegrese de hacerle al mdico cualquier pregunta que tenga. Document Released: 01/18/2013 Document Revised: 06/08/2014 Document Reviewed: 07/19/2012 Elsevier Interactive Patient Education  2017 [employer].

## 2017-01-04 NOTE — MAU Note (Signed)
Pt reports contractions for 4 hours, reports mucous discharge.

## 2017-01-04 NOTE — MAU Provider Note (Signed)
History     CSN: [id number]  Arrival date and time: 01/04/17 1323   None     No chief complaint on file.  HPI Shannon Myers is 31 y.o. [phone] [redacted]w[redacted]d weeks presenting for evaluation of labor.  She states she loss her mucous plug, vaginal discharge and  pressure/contractions-light.  Neg for  Leaking of fluid, vaginal bleeding.  + for fetal movement.   She is a patient in the Clinic.  High risk pregnancy with hx of Preterm Delivery.  Is getting Makena injections weekly.    Past Medical History:  Diagnosis Date  . Anemia   . Preterm labor   . Vaginal bleeding in pregnancy    Last pregnancy    Past Surgical History:  Procedure Laterality Date  . NO PAST SURGERIES      Family History  Problem Relation Age of Onset  . Diabetes Mother   . Alcohol abuse Neg Hx   . Asthma Neg Hx   . Arthritis Neg Hx   . Birth defects Neg Hx   . Cancer Neg Hx   . COPD Neg Hx   . Depression Neg Hx   . Drug abuse Neg Hx   . Early death Neg Hx   . Hearing loss Neg Hx   . Heart disease Neg Hx   . Hyperlipidemia Neg Hx   . Hypertension Neg Hx   . Kidney disease Neg Hx   . Learning disabilities Neg Hx   . Mental illness Neg Hx   . Mental retardation Neg Hx   . Miscarriages / Stillbirths Neg Hx   . Stroke Neg Hx   . Vision loss Neg Hx     Social History  Substance Use Topics  . Smoking status: Never Smoker  . Smokeless tobacco: Never Used  . Alcohol use No    Allergies: No Known Allergies  Facility-Administered Medications Prior to Admission  Medication Dose Route Frequency Provider Last Rate Last Dose  . hydroxyprogesterone caproate (MAKENA) 250 mg/mL injection 250 mg  250 mg Intramuscular Weekly Lorne Skeens, MD   250 mg at 12/30/16 1018   Prescriptions Prior to Admission  Medication Sig Dispense Refill Last Dose  . Acetaminophen (TYLENOL PO) Take by mouth.   Unknown at Unknown time  . Prenatal Vit-Fe Fumarate-FA (PRENATAL VITAMIN PO) Take by mouth.   01/01/2017 at  Unknown time    Review of Systems  Constitutional: Negative for fever.  Respiratory: Negative for chest tightness.   Cardiovascular: Negative for chest pain.  Gastrointestinal: Positive for abdominal pain (" light" contractions). Negative for nausea and vomiting.  Genitourinary: Positive for vaginal discharge (mucous plug). Negative for dysuria, pelvic pain and vaginal bleeding.  Psychiatric/Behavioral: Negative for agitation.   Physical Exam   Blood pressure 127/64, pulse (!) 103, temperature 98.1 F (36.7 C), temperature source Oral, resp. rate 16, height 4\' 10"  (1.473 m), weight 185 lb (83.9 kg), last menstrual period 05/01/2016, SpO2 100 %, currently breastfeeding.  Physical Exam  Nursing note and vitals reviewed. Constitutional: She is oriented to person, place, and time. She appears well-developed and well-nourished. No distress.  HENT:  Head: Normocephalic.  GI: Soft. She exhibits distension (gravid uterus).  Genitourinary: There is no rash, tenderness or lesion on the right labia. There is no rash, tenderness or lesion on the left labia. Uterus is enlarged. Uterus is not tender. Cervix exhibits no discharge. There is bleeding in the vagina. No erythema or tenderness in the vagina. Vaginal discharge (+ for white  mucous,  ) found.  Genitourinary Comments: Cervical exam by Katie, RN-1.5 cm dilated, 50%, -3cm  Neurological: She is alert and oriented to person, place, and time.  Skin: Skin is warm and dry.  Psychiatric: She has a normal mood and affect.   MAU Course  Procedures  MDM MSE Exam NST-reactive, baseline 125 bpm. Mod variability, irregular contractions. accels noted. Marily Memosdna, Spanish interpretor used   Assessment and Plan  A:  False Labor/Braxton-Hicks Contractions       High risk factor--History of preterm delivery  P:  Keep scheduled appt in the Clinicfor Friday, Aug 10       To return for increasing/ regular contractions, vaginal bleeding, loss of fluid or  decreased       fetal movement.              Dennison Mascotve M Alda Gaultney 01/04/2017, 2:03 PM

## 2017-01-04 NOTE — MAU Note (Signed)
Urine in lab 

## 2017-01-07 ENCOUNTER — Ambulatory Visit (INDEPENDENT_AMBULATORY_CARE_PROVIDER_SITE_OTHER): Payer: Self-pay | Admitting: *Deleted

## 2017-01-07 DIAGNOSIS — O26893 Other specified pregnancy related conditions, third trimester: Secondary | ICD-10-CM

## 2017-01-07 DIAGNOSIS — R300 Dysuria: Secondary | ICD-10-CM

## 2017-01-07 DIAGNOSIS — N898 Other specified noninflammatory disorders of vagina: Secondary | ICD-10-CM

## 2017-01-07 LAB — POCT URINALYSIS DIP (DEVICE)
BILIRUBIN URINE: NEGATIVE
GLUCOSE, UA: NEGATIVE mg/dL
Hgb urine dipstick: NEGATIVE
KETONES UR: NEGATIVE mg/dL
Nitrite: NEGATIVE
PH: 7 (ref 5.0–8.0)
Protein, ur: NEGATIVE mg/dL
Specific Gravity, Urine: 1.015 (ref 1.005–1.030)
Urobilinogen, UA: 2 mg/dL — ABNORMAL HIGH (ref 0.0–1.0)

## 2017-01-07 NOTE — Progress Notes (Signed)
Video interpreter Gena [phone] used for encounter.  Pt reports having UC every 2 hours and yellow vaginal discharge x3 days. She also has irritation of the vaginal area and pain with urination. Pt states she was seen @ MAU on 8/6. The d/c was only white/mucous @ that time. Now it is thicker and more yellow. Self vaginal swab for wet prep. Pt is not sexually active. CCUA obtained - small leukocytes present. Specimen sent for urine culture.  Pt has scheduled appt tomorrow for Ob fu.

## 2017-01-08 ENCOUNTER — Ambulatory Visit (INDEPENDENT_AMBULATORY_CARE_PROVIDER_SITE_OTHER): Payer: Self-pay | Admitting: Obstetrics and Gynecology

## 2017-01-08 VITALS — BP 112/69 | HR 88 | Wt 186.2 lb

## 2017-01-08 DIAGNOSIS — O09899 Supervision of other high risk pregnancies, unspecified trimester: Secondary | ICD-10-CM

## 2017-01-08 DIAGNOSIS — Z113 Encounter for screening for infections with a predominantly sexual mode of transmission: Secondary | ICD-10-CM

## 2017-01-08 DIAGNOSIS — Z3009 Encounter for other general counseling and advice on contraception: Secondary | ICD-10-CM

## 2017-01-08 DIAGNOSIS — O09213 Supervision of pregnancy with history of pre-term labor, third trimester: Secondary | ICD-10-CM

## 2017-01-08 DIAGNOSIS — O09219 Supervision of pregnancy with history of pre-term labor, unspecified trimester: Secondary | ICD-10-CM

## 2017-01-08 DIAGNOSIS — O0990 Supervision of high risk pregnancy, unspecified, unspecified trimester: Secondary | ICD-10-CM

## 2017-01-08 DIAGNOSIS — O0993 Supervision of high risk pregnancy, unspecified, third trimester: Secondary | ICD-10-CM

## 2017-01-08 DIAGNOSIS — Z331 Pregnant state, incidental: Secondary | ICD-10-CM

## 2017-01-08 LAB — POCT URINALYSIS DIP (DEVICE)
BILIRUBIN URINE: NEGATIVE
Glucose, UA: NEGATIVE mg/dL
HGB URINE DIPSTICK: NEGATIVE
Ketones, ur: NEGATIVE mg/dL
Nitrite: NEGATIVE
PH: 6 (ref 5.0–8.0)
Protein, ur: NEGATIVE mg/dL
SPECIFIC GRAVITY, URINE: 1.015 (ref 1.005–1.030)
UROBILINOGEN UA: 0.2 mg/dL (ref 0.0–1.0)

## 2017-01-08 NOTE — Progress Notes (Signed)
Patient here today for Routine HOB visit.

## 2017-01-08 NOTE — Progress Notes (Signed)
Subjective:  Shannon Myers is a 31 y.o. [phone] at 4537w0d being seen today for ongoing prenatal care.  She is currently monitored for the following issues for this high-risk pregnancy and has Obesity; Supervision of high-risk pregnancy; History of preterm delivery, currently pregnant; and Unwanted fertility on her problem list.  Patient reports occasional contractions.  Contractions: Irregular. Vag. Bleeding: None.  Movement: Present. Denies leaking of fluid.   The following portions of the patient's history were reviewed and updated as appropriate: allergies, current medications, past family history, past medical history, past social history, past surgical history and problem list. Problem list updated.  Objective:   Vitals:   01/08/17 0914  BP: 112/69  Pulse: 88  Weight: 186 lb 3.2 oz (84.5 kg)    Fetal Status:     Movement: Present     General:  Alert, oriented and cooperative. Patient is in no acute distress.  Skin: Skin is warm and dry. No rash noted.   Cardiovascular: Normal heart rate noted  Respiratory: Normal respiratory effort, no problems with respiration noted  Abdomen: Soft, gravid, appropriate for gestational age. Pain/Pressure: Present     Pelvic:  Cervical exam performed        Extremities: Normal range of motion.  Edema: Trace  Mental Status: Normal mood and affect. Normal behavior. Normal judgment and thought content.   Urinalysis: Urine Protein: Negative Urine Glucose: Negative  Assessment and Plan:  Pregnancy: [id number] at 3937w0d  1. Supervision of high risk pregnancy, antepartum Stable Labor precautions - POCT urinalysis dip (device) - Culture, beta strep (group b only) - GC/Chlamydia probe amp ([place]  2. History of preterm delivery, currently pregnant Last 17 OHP injection today  3. Unwanted fertility No funding  Term labor symptoms and general obstetric precautions including but not limited to vaginal bleeding, contractions,  leaking of fluid and fetal movement were reviewed in detail with the patient. Please refer to After Visit Summary for other counseling recommendations.  Return in about 1 week (around 01/15/2017) for OB visit.   Hermina StaggersErvin, Michael L, MD

## 2017-01-08 NOTE — Patient Instructions (Signed)
Parto vaginal (Vaginal Delivery) Durante el parto, el mdico la ayudar a dar a luz a su beb. En elparto vaginal, deber pujar para que el beb salga por la vagina. Sin embargo, antes de que pueda sacar al beb, es necesario que ocurran ciertas cosas. La abertura del tero (cuello del tero) tiene que ablandarse, hacerse ms delgado y abrirse (dilatar) hasta que llegue a 10 cm. Adems, el beb tiene que bajar desde el tero a la vagina. SIGNOS DE TRABAJO DE PARTO  El mdico tendr primero que asegurarse de que usted est en trabajo de parto. Algunos signos son:   Eliminar lo que se llama tapn mucoso antes del inicio del trabajo de parto. Este es una pequea cantidad de mucosidad teida con sangre.  Tener contracciones uterinas regulares y dolorosas.   El tiempo entre las contracciones debe acortarse  Las molestias y el dolor se harn ms intensos gradualmente.  El dolor de las contracciones empeora al caminar y no se alivia con el reposo.   El cuello del tero se hace mas delgado (se borra) y se dilata. ANTES DEL PARTO Una vez que se inicie el trabajo de parto y sea admitida en el hospital o sanatorio, el mdico podr hacer lo siguiente:   Realizar un examen fsico.  Controlar si hay complicaciones relacionadas con el trabajo de parto.  Verificar su presin arterial, temperatura y pulso y la frecuencia cardaca (signos vitales).   Determinar si se ha roto el saco amnitico y cundo ha ocurrido.  Realizar un examen vaginal (utilizando un guante estril y un lubricante) para determinar: ? La posicin (presentacin) del beb. El beb se presenta con la cabeza primero (vertex) en el canal de parto (vagina), o estn los pies o las nalgas primero (de nalgas)? ? El nivel (estacin) de la cabeza del beb dentro del canal de parto. ? El borramiento y la dilatacin del cuello uterino  El monitor fetal electrnico generalmente se coloca sobre el abdomen al llegar. Se utiliza para  controlar las contracciones y la frecuencia cardaca del beb. ? Cuando el monitor est en el abdomen (monitor fetal externo), slo toma la frecuencia y la duracin de las contracciones. No informa acerca de la intensidad de las contracciones. ? Si el mdico necesita saber exactamente la intensidad de las contracciones o cul es la frecuencia cardaca del beb, colocar un monitor interno en la vagina y el tero. El mdico comentar los riesgos y los beneficios de usar un monitor interno y le pedir autorizacin antes de colocar el dispositivo. ? El monitoreo fetal continuo ser necesario si le han aplicado una epidural, si le administran ciertos medicamentos (como oxitocina) y si tiene complicaciones del embarazo o del trabajo de parto.  Podrn colocarle una va intravenosa en una vena del brazo para suministrarle lquidos y medicamentos, si es necesario. TRES ETAPAS DEL TRABAJO DE PARTO Y EL PARTO El trabajo de parto y el parto normales se dividen en tres etapas. Primera etapa Esta etapa comienza cuando comienzan las contracciones regulares y el cuello comienza a borrarse y dilatarse. Finaliza cuando el cuello est completamente abierto (completamente dilatado). La primera etapa es la etapa ms larga del trabajo de parto y puede durar desde 3 horas a 15 horas.  Algunos mtodos estn disponibles para ayudar con el dolor del parto. Usted y su mdico decidirn qu opcin es la mejor para usted. Las opciones incluyen:   Medicamentos narcticos. Estos son medicamentos fuertes que usted puede recibir a travs de una va intravenosa o   como inyeccin en el msculo. Estos medicamentos alivian el dolor pero no hacen que desaparezca completamente.  Epidural. Se administra un medicamento a travs de un tubo delgado que se inserta en la espalda. El medicamento adormece la parte inferior del cuerpo y evita el dolor en esa zona.  Bloqueo paracervical Es una inyeccin de un anestsico en cada lado del cuello  uterino.  Usted podr pedir un parto natural, que implica que no se usen analgsicos ni epidural durante el parto y el trabajo de parto. En cambio, podr tener otro tipo de ayuda como ejercicios respiratorios para hacer frente al dolor. Segunda etapa La segunda etapa del trabajo de parto comienza cuando el cuello se ha dilatado completamente a 10 cm. Contina hasta que usted puja al beb hacia abajo, por el canal de parto, y el beb nace. Esta etapa puede durar slo algunos minutos o algunas horas.  La posicin del la cabeza del beb a medida que pasa por el canal de parto, es informada como un nmero, llamado estacin. Si la cabeza del beb no ha iniciado su descenso, la estacin se describe como que est en menos 3 (-3). Cuando la cabeza del beb est en la estacin cero, est en el medio del canal de parto y se encaja en la pelvis. La estacin en la que se encuentra el beb indica el progreso de la segunda etapa del trabajo de parto.  Cuando el beb nace, el mdico lo sostendr con la cabeza hacia abajo para evitar que el lquido amnitico, el moco y la sangre entren en los pulmones del beb. La boca y la nariz del beb podrn ser succionadas con un pequeo bulbo para retirar todo lquido adicional.  El mdico podr colocar al beb sobre su estmago. Es importante evitar que el beb tome fro. Para hacerlo, el mdico secar al beb, lo colocar directamente sobre su piel, (sin mantas entre usted y el beb) y lo cubrir con mantas secas y tibias.  Se corta el cordn umbilical. Tercera etapa Durante la tercera etapa del trabajo de parto, el mdico sacar la placenta (alumbramiento) y se asegurar de que el sangrado est controlado. La salida de la placenta generalmente demora 5 minutos pero puede tardar hasta 30 minutos. Luego de la salida de la placenta, le darn un medicamento por va intravenosa o inyectable para ayudar a contraer el tero y controlar el sangrado. Si planea amamantar al beb,  puede intentar en este momento Luego de la salida de la placenta, el tero debe contraerse y quedar muy firme. Si el tero no queda firme, el mdico lo masajear. Esto es importante debido a que la contraccin del tero ayuda a cortar el sangrado en el sitio en que la placenta estaba unida al tero. Si el tero no se contrae adecuadamente ni permanece firme, podr causar un sangrado abundante. Si hay mucho sangrado, podrn darle medicamentos para contraer el tero y detener el sangrado.  Esta informacin no tiene como fin reemplazar el consejo del mdico. Asegrese de hacerle al mdico cualquier pregunta que tenga. Document Released: 04/30/2008 Document Revised: 06/08/2014 Elsevier Interactive Patient Education  2017 Elsevier Inc.  

## 2017-01-09 ENCOUNTER — Inpatient Hospital Stay (HOSPITAL_COMMUNITY)
Admission: AD | Admit: 2017-01-09 | Discharge: 2017-01-09 | Disposition: A | Discharge status: Home / Self Care | Payer: Self-pay | Source: Ambulatory Visit | Attending: Obstetrics & Gynecology | Admitting: Obstetrics & Gynecology

## 2017-01-09 ENCOUNTER — Telehealth (HOSPITAL_COMMUNITY): Payer: Self-pay | Admitting: *Deleted

## 2017-01-09 ENCOUNTER — Encounter (HOSPITAL_COMMUNITY): Payer: Self-pay | Admitting: Certified Nurse Midwife

## 2017-01-09 DIAGNOSIS — O479 False labor, unspecified: Secondary | ICD-10-CM | POA: Insufficient documentation

## 2017-01-09 DIAGNOSIS — O0990 Supervision of high risk pregnancy, unspecified, unspecified trimester: Secondary | ICD-10-CM | POA: Insufficient documentation

## 2017-01-09 DIAGNOSIS — O09219 Supervision of pregnancy with history of pre-term labor, unspecified trimester: Secondary | ICD-10-CM | POA: Insufficient documentation

## 2017-01-09 DIAGNOSIS — O09899 Supervision of other high risk pregnancies, unspecified trimester: Secondary | ICD-10-CM

## 2017-01-09 DIAGNOSIS — Z3A00 Weeks of gestation of pregnancy not specified: Secondary | ICD-10-CM | POA: Insufficient documentation

## 2017-01-09 DIAGNOSIS — O0993 Supervision of high risk pregnancy, unspecified, third trimester: Secondary | ICD-10-CM

## 2017-01-09 NOTE — MAU Note (Signed)
Contractions since 0200. Bloody show but no leaking of fld

## 2017-01-09 NOTE — MAU Note (Signed)
I have communicated with Dr. Cliffton AstersWhite and reviewed vital signs:  Vitals:   01/09/17 0551 01/09/17 0723  BP: 125/70 111/69  Pulse: 85   Resp: 18   Temp: 97.9 F (36.6 C)     Vaginal exam:  Dilation: 2 Effacement (%): 70 Station: -1 Presentation: Vertex Exam by:: n Sharissa Brierley rn,   Also reviewed contraction pattern and that non-stress test is reactive.  It has been documented that patient is contracting every 5-7 minutes with no cervical change over 1.5 hours not indicating active labor.  Patient denies any other complaints.  Based on this report provider has given order for discharge.  A discharge order and diagnosis entered by a provider.   Labor discharge instructions reviewed with patient.

## 2017-01-09 NOTE — Discharge Instructions (Signed)
Contracciones de Braxton Hicks °(Braxton Hicks Contractions) °Durante el embarazo, pueden presentarse contracciones uterinas que no siempre indican que está en trabajo de parto. °¿QUÉ SON LAS CONTRACCIONES DE BRAXTON HICKS? °Las contracciones que se presentan antes del trabajo de parto se conocen como contracciones de Braxton Hicks o falso trabajo de parto. Hacia el final del embarazo (32 a 34 semanas), estas contracciones pueden aparecen con más frecuencia y volverse más intensas. No corresponden al trabajo de parto verdadero porque estas contracciones no producen el agrandamiento (la dilatación) y el afinamiento del cuello del útero. Algunas veces, es difícil distinguirlas del trabajo de parto verdadero porque en algunos casos pueden ser muy intensas, y las personas tienen diferentes niveles de tolerancia al dolor. No debe sentirse avergonzada si concurre al hospital con falso trabajo de parto. En ocasiones, la única forma de saber si el trabajo de parto es verdadero es que el médico determine si hay cambios en el cuello del útero. °Si no hay problemas prenatales u otras complicaciones de salud asociadas con el embarazo, no habrá inconvenientes si la envían a su casa con falso trabajo de parto y espera que comience el verdadero. °CÓMO DIFERENCIAR EL TRABAJO DE PARTO FALSO DEL VERDADERO °Falso trabajo de parto  °· Las contracciones del falso trabajo de parto duran menos y no son tan intensas como las verdaderas. °· Generalmente son irregulares. °· A menudo, se sienten en la parte delantera de la parte baja del abdomen y en la ingle, °· y pueden desaparecer cuando camina o cambia de posición mientras está acostada. °· Las contracciones se vuelven más débiles y su duración es menor a medida que el tiempo transcurre. °· Por lo general, no se hacen progresivamente más intensas, regulares y cercanas entre sí como en el caso del trabajo de parto verdadero. °Verdadero trabajo de parto  °· Las contracciones del verdadero  trabajo de parto duran de 30 a 70 segundos, son muy regulares y suelen volverse más intensas, y aumenta su frecuencia. °· No desaparecen cuando camina. °· La molestia generalmente se siente en la parte superior del útero y se extiende hacia la zona inferior del abdomen y hacia la cintura. °· El médico podrá examinarla para determinar si el trabajo de parto es verdadero. El examen mostrará si el cuello del útero se está dilatando y afinando. °LO QUE DEBE RECORDAR °· Continúe haciendo los ejercicios habituales y siga otras indicaciones que el médico le dé. °· Tome todos los medicamentos como le indicó el médico. °· Concurra a las visitas prenatales regulares. °· Coma y beba con moderación si cree que está en trabajo de parto. °· Si las contracciones de Braxton Hicks le provocan incomodidad: °¨ Cambie de posición: si está acostada o descansando, camine; si está caminando, descanse. °¨ Siéntese y descanse en una bañera con agua tibia. °¨ Beba 2 o 3 vasos de agua. La deshidratación puede provocar contracciones. °¨ Respire lenta y profundamente varias veces por hora. °¿CUÁNDO DEBO BUSCAR ASISTENCIA MÉDICA INMEDIATA? °Solicite atención médica de inmediato si: °· Las contracciones se intensifican, se hacen más regulares y cercanas entre sí. °· Tiene una pérdida de líquido por la vagina. °· Tiene fiebre. °· Elimina mucosidad manchada con sangre. °· Tiene una hemorragia vaginal abundante. °· Tiene dolor abdominal permanente. °· Tiene un dolor en la zona lumbar que nunca tuvo antes. °· Siente que la cabeza del bebé empuja hacia abajo y ejerce presión en la zona pélvica. °· El bebé no se mueve tanto como solía. °Esta información no tiene como fin reemplazar el   consejo del médico. Asegúrese de hacerle al médico cualquier pregunta que tenga. °Document Released: 02/25/2005 Document Revised: 09/09/2015 Document Reviewed: 02/27/2013 °Elsevier Interactive Patient Education © 2017 Elsevier Inc. ° °

## 2017-01-10 LAB — CULTURE, OB URINE

## 2017-01-10 LAB — URINE CULTURE, OB REFLEX

## 2017-01-11 LAB — CERVICOVAGINAL ANCILLARY ONLY
Bacterial vaginitis: NEGATIVE
Candida vaginitis: NEGATIVE

## 2017-01-11 LAB — GC/CHLAMYDIA PROBE AMP (~~LOC~~) NOT AT ARMC
CHLAMYDIA, DNA PROBE: NEGATIVE
NEISSERIA GONORRHEA: NEGATIVE

## 2017-01-13 LAB — CULTURE, BETA STREP (GROUP B ONLY): Strep Gp B Culture: NEGATIVE

## 2017-01-18 ENCOUNTER — Encounter: Payer: Self-pay | Admitting: Obstetrics and Gynecology

## 2017-01-19 ENCOUNTER — Encounter: Payer: Self-pay | Admitting: Obstetrics and Gynecology

## 2017-01-19 NOTE — Progress Notes (Signed)
Patient did not keep OB appointment for 01/18/2017.  Toren Tucholski, Jr MD Attending [facility] (Faculty Practice)   

## 2017-04-30 ENCOUNTER — Encounter (HOSPITAL_COMMUNITY): Payer: Self-pay

## 2019-03-21 ENCOUNTER — Emergency Department (HOSPITAL_COMMUNITY)
Admission: EM | Admit: 2019-03-21 | Discharge: 2019-03-22 | Disposition: A | Discharge status: Home / Self Care | Payer: Self-pay | Attending: Emergency Medicine | Admitting: Emergency Medicine

## 2019-03-21 ENCOUNTER — Encounter (HOSPITAL_COMMUNITY): Payer: Self-pay

## 2019-03-21 ENCOUNTER — Other Ambulatory Visit: Payer: Self-pay

## 2019-03-21 DIAGNOSIS — Z79899 Other long term (current) drug therapy: Secondary | ICD-10-CM | POA: Insufficient documentation

## 2019-03-21 DIAGNOSIS — J020 Streptococcal pharyngitis: Secondary | ICD-10-CM | POA: Insufficient documentation

## 2019-03-21 DIAGNOSIS — R1011 Right upper quadrant pain: Secondary | ICD-10-CM | POA: Insufficient documentation

## 2019-03-21 LAB — COMPREHENSIVE METABOLIC PANEL
ALT: 30 U/L (ref 0–44)
AST: 18 U/L (ref 15–41)
Albumin: 3.9 g/dL (ref 3.5–5.0)
Alkaline Phosphatase: 64 U/L (ref 38–126)
Anion gap: 11 (ref 5–15)
BUN: 5 mg/dL — ABNORMAL LOW (ref 6–20)
CO2: 21 mmol/L — ABNORMAL LOW (ref 22–32)
Calcium: 8.9 mg/dL (ref 8.9–10.3)
Chloride: 102 mmol/L (ref 98–111)
Creatinine, Ser: 0.53 mg/dL (ref 0.44–1.00)
GFR calc Af Amer: >60 mL/min (ref >60)
GFR calc non Af Amer: >60 mL/min (ref >60)
Glucose, Bld: 107 mg/dL — ABNORMAL HIGH (ref 70–99)
Potassium: 3.6 mmol/L (ref 3.5–5.1)
Sodium: 134 mmol/L — ABNORMAL LOW (ref 135–145)
Total Bilirubin: 2 mg/dL — ABNORMAL HIGH (ref 0.3–1.2)
Total Protein: 8.1 g/dL (ref 6.5–8.1)

## 2019-03-21 LAB — URINALYSIS, ROUTINE W REFLEX MICROSCOPIC
Bilirubin Urine: NEGATIVE
Glucose, UA: NEGATIVE mg/dL
Hgb urine dipstick: NEGATIVE
Ketones, ur: NEGATIVE mg/dL
Leukocytes,Ua: NEGATIVE
Nitrite: NEGATIVE
Protein, ur: NEGATIVE mg/dL
Specific Gravity, Urine: 1.015 (ref 1.005–1.030)
pH: 7 (ref 5.0–8.0)

## 2019-03-21 LAB — CBC
HCT: 41 % (ref 36.0–46.0)
Hemoglobin: 14.5 g/dL (ref 12.0–15.0)
MCH: 30.1 pg (ref 26.0–34.0)
MCHC: 35.4 g/dL (ref 30.0–36.0)
MCV: 85.2 fL (ref 80.0–100.0)
Platelets: 209 10*3/uL (ref 150–400)
RBC: 4.81 MIL/uL (ref 3.87–5.11)
RDW: 12.1 % (ref 11.5–15.5)
WBC: 13.8 10*3/uL — ABNORMAL HIGH (ref 4.0–10.5)
nRBC: 0 % (ref 0.0–0.2)

## 2019-03-21 LAB — LIPASE, BLOOD: Lipase: 23 U/L (ref 11–51)

## 2019-03-21 LAB — I-STAT BETA HCG BLOOD, ED (MC, WL, AP ONLY): I-stat hCG, quantitative: <5 m[IU]/mL (ref <5)

## 2019-03-21 MED ORDER — SODIUM CHLORIDE 0.9% FLUSH: 3.0000 mL | Freq: Once | INTRAVENOUS | Status: DC

## 2019-03-21 NOTE — ED Triage Notes (Signed)
Spanish interpreter used for triage:  Pt reports fever, chills and abd pain since yesterday. Denies n/v/d. Pt also reports a bump on her throat since yesterday.  Pt a.o, nad noted.

## 2019-03-22 ENCOUNTER — Emergency Department (HOSPITAL_COMMUNITY): Payer: Self-pay

## 2019-03-22 LAB — GROUP A STREP BY PCR: Group A Strep by PCR: DETECTED — AB

## 2019-03-22 MED ORDER — AMOXICILLIN 500 MG PO CAPS
500.0000 mg | ORAL_CAPSULE | Freq: Once | ORAL | Status: AC
  Administered 2019-03-22: 05:00:00 500 mg via ORAL
  Filled 2019-03-22: qty 1

## 2019-03-22 MED ORDER — AMOXICILLIN 500 MG PO CAPS: 500.0000 mg | ORAL_CAPSULE | Freq: Three times a day (TID) | ORAL | 0 refills | Status: DC

## 2019-03-22 NOTE — ED Provider Notes (Signed)
[facility] EMERGENCY DEPARTMENT Provider Note   CSN: [id number] Arrival date & time: 03/21/19  2018     History   Chief Complaint Chief Complaint  Patient presents with  . Abdominal Pain  . Sore Throat    HPI Shannon Myers is a 33 y.o. female.     Patient presents to the emergency department with a chief complaint of fevers, chills, and abdominal pain.  She states the symptoms started earlier today.  She reports subjective fever, but did not measure it.  She states that she has had right upper abdominal pain.  She denies any nausea, vomiting, or diarrhea.  Denies any dysuria, hematuria, new or unusual vaginal discharge or bleeding.  She denies taking anything for her symptoms.  Nothing makes her symptoms better or worse.  She also complains of sore throat.  The history is provided by the patient. The history is limited by a language barrier. A language interpreter was used Licensed conveyancer).    Past Medical History:  Diagnosis Date  . Anemia   . Preterm labor   . Vaginal bleeding in pregnancy    Last pregnancy    Patient Active Problem List   Diagnosis Date Noted  . Unwanted fertility 12/30/2016  . Supervision of high-risk pregnancy 07/29/2016  . History of preterm delivery, currently pregnant 07/29/2016  . Obesity 01/19/2013    Past Surgical History:  Procedure Laterality Date  . NO PAST SURGERIES       OB History    Gravida  7   Para  6   Term  5   Preterm  1   AB      Living  6     SAB      TAB      Ectopic      Multiple  0   Live Births  6            Home Medications    Prior to Admission medications   Medication Sig Start Date End Date Taking? Authorizing Provider  Acetaminophen (TYLENOL PO) Take by mouth.    [provider]  Prenatal Vit-Fe Fumarate-FA (PRENATAL VITAMIN PO) Take by mouth.    [provider]    Family History Family History  Problem Relation Age of Onset  .  Diabetes Mother   . Alcohol abuse Neg Hx   . Asthma Neg Hx   . Arthritis Neg Hx   . Birth defects Neg Hx   . Cancer Neg Hx   . COPD Neg Hx   . Depression Neg Hx   . Drug abuse Neg Hx   . Early death Neg Hx   . Hearing loss Neg Hx   . Heart disease Neg Hx   . Hyperlipidemia Neg Hx   . Hypertension Neg Hx   . Kidney disease Neg Hx   . Learning disabilities Neg Hx   . Mental illness Neg Hx   . Mental retardation Neg Hx   . Miscarriages / Stillbirths Neg Hx   . Stroke Neg Hx   . Vision loss Neg Hx     Social History Social History   Tobacco Use  . Smoking status: Never Smoker  . Smokeless tobacco: Never Used  Substance Use Topics  . Alcohol use: No  . Drug use: No     Allergies   Patient has no known allergies.   Review of Systems Review of Systems  All other systems reviewed and are negative.  Physical Exam Updated Vital Signs BP (!) 95/59   Pulse 80   Temp 99.4 F (37.4 C) (Oral)   Resp 18   Ht 4\' 11"  (1.499 m)   Wt 88.5 kg   LMP 08/18/2017   SpO2 98%   BMI 39.39 kg/m   Physical Exam Vitals signs and nursing note reviewed.  Constitutional:      General: She is not in acute distress.    Appearance: She is well-developed.  HENT:     Head: Normocephalic and atraumatic.     Mouth/Throat:     Comments: Oropharynx appears normal, no exudates, no abscess, no erythema Eyes:     Conjunctiva/sclera: Conjunctivae normal.  Neck:     Musculoskeletal: Neck supple.  Cardiovascular:     Rate and Rhythm: Normal rate and regular rhythm.     Heart sounds: No murmur.  Pulmonary:     Effort: Pulmonary effort is normal. No respiratory distress.     Breath sounds: Normal breath sounds.  Abdominal:     Palpations: Abdomen is soft.     Tenderness: There is abdominal tenderness.     Comments: Right upper quadrant tenderness to palpation  Skin:    General: Skin is warm and dry.  Neurological:     Mental Status: She is alert.      ED Treatments / Results   Labs (all labs ordered are listed, but only abnormal results are displayed) Labs Reviewed  COMPREHENSIVE METABOLIC PANEL - Abnormal; Notable for the following components:      Result Value   Sodium 134 (*)    CO2 21 (*)    Glucose, Bld 107 (*)    BUN 5 (*)    Total Bilirubin 2.0 (*)    All other components within normal limits  CBC - Abnormal; Notable for the following components:   WBC 13.8 (*)    All other components within normal limits  LIPASE, BLOOD  URINALYSIS, ROUTINE W REFLEX MICROSCOPIC  I-STAT BETA HCG BLOOD, ED ([facility], [facility], [facility] ONLY)    EKG None  Radiology No results found.  Procedures Procedures (including critical care time)  Medications Ordered in ED Medications  sodium chloride flush (NS) 0.9 % injection 3 mL (has no administration in time range)     Initial Impression / Assessment and Plan / ED Course  I have reviewed the triage vital signs and the nursing notes.  Pertinent labs & imaging results that were available during my care of the patient were reviewed by me and considered in my medical decision making (see chart for details).        Patient with subjective fevers and chills.  Reports associated right upper abdominal pain and sore throat.  LFTs normal.  T bili is 2.0.  No focal abdominal tenderness.  White blood cell count 13.8.  Right upper quadrant ultrasound notable for no acute findings.  Rapid strep test was positive.  Treat with amoxicillin.  Final Clinical Impressions(s) / ED Diagnoses   Final diagnoses:  Strep pharyngitis    ED Discharge Orders         Ordered    amoxicillin (AMOXIL) 500 MG capsule  3 times daily     03/22/19 0522           03/24/19, PA-C 03/22/19 0531    03/24/19, MD 03/22/19 [phone]

## 2019-03-22 NOTE — ED Notes (Signed)
Patient verbalizes understanding of discharge instructions. Opportunity for questioning and answers were provided. Armband removed by staff, pt discharged from ED. Pt. ambulatory and discharged home.  

## 2020-02-16 ENCOUNTER — Encounter (HOSPITAL_COMMUNITY): Payer: Self-pay

## 2020-02-16 ENCOUNTER — Ambulatory Visit (HOSPITAL_COMMUNITY)
Admission: EM | Admit: 2020-02-16 | Discharge: 2020-02-16 | Disposition: A | Discharge status: Home / Self Care | Payer: Self-pay | Attending: Emergency Medicine | Admitting: Emergency Medicine

## 2020-02-16 ENCOUNTER — Other Ambulatory Visit: Payer: Self-pay

## 2020-02-16 DIAGNOSIS — R110 Nausea: Secondary | ICD-10-CM

## 2020-02-16 DIAGNOSIS — N949 Unspecified condition associated with female genital organs and menstrual cycle: Secondary | ICD-10-CM

## 2020-02-16 DIAGNOSIS — Z3202 Encounter for pregnancy test, result negative: Secondary | ICD-10-CM

## 2020-02-16 LAB — POCT URINALYSIS DIPSTICK, ED / UC
Bilirubin Urine: NEGATIVE
Glucose, UA: NEGATIVE mg/dL
Hgb urine dipstick: NEGATIVE
Ketones, ur: NEGATIVE mg/dL
Leukocytes,Ua: NEGATIVE
Nitrite: NEGATIVE
Protein, ur: NEGATIVE mg/dL
Specific Gravity, Urine: >=1.03 (ref 1.005–1.030)
Urobilinogen, UA: 0.2 mg/dL (ref 0.0–1.0)
pH: 6 (ref 5.0–8.0)

## 2020-02-16 LAB — POC URINE PREG, ED: Preg Test, Ur: NEGATIVE

## 2020-02-16 MED ORDER — ONDANSETRON 4 MG PO TBDP: 4.0000 mg | ORAL_TABLET | Freq: Three times a day (TID) | ORAL | 0 refills | Status: DC | PRN

## 2020-02-16 MED ORDER — NAPROXEN 500 MG PO TABS: 500.0000 mg | ORAL_TABLET | Freq: Two times a day (BID) | ORAL | 0 refills | Status: DC

## 2020-02-16 NOTE — ED Triage Notes (Signed)
Pt wants pregnancy test. Pt spotted on Sept 6, but did not have a normal menstrual. Pt c/o dizziness and nauseax1 mo.

## 2020-02-16 NOTE — Discharge Instructions (Signed)
Urino fue normal Zofran para nausea Naprosyn para dolor Examen vaginal tomo como 1-2 dia para los resultados Ir al OBGYN si  continue tiene problemas con su regla

## 2020-02-16 NOTE — ED Provider Notes (Signed)
[facility]    CSN: [id number] Arrival date & time: 02/16/20  1550      History   Chief Complaint Chief Complaint  Patient presents with  . Possible Pregnancy    HPI Shannon Myers is a 34 y.o. female presenting today for evaluation of possible pregnancy test.  She reports Recently have been feeling dizzy, nausea, for 1.5 month. LMP 12/05/2019, small cycle. 02/05/2020 had some spotting for 1 day. Does report lower abdominal pain. Has vaginal burning, dysuria, urinary frequency. Denies hematuria.   HPI  Past Medical History:  Diagnosis Date  . Anemia   . Preterm labor   . Vaginal bleeding in pregnancy    Last pregnancy    Patient Active Problem List   Diagnosis Date Noted  . Unwanted fertility 12/30/2016  . Supervision of high-risk pregnancy 07/29/2016  . History of preterm delivery, currently pregnant 07/29/2016  . Obesity 01/19/2013    Past Surgical History:  Procedure Laterality Date  . NO PAST SURGERIES      OB History    Gravida  7   Para  6   Term  5   Preterm  1   AB      Living  6     SAB      TAB      Ectopic      Multiple  0   Live Births  6            Home Medications    Prior to Admission medications   Medication Sig Start Date End Date Taking? Authorizing Provider  norethindrone (MICRONOR) 0.35 MG tablet Take by mouth. 01/18/17  Yes [provider]  Acetaminophen (TYLENOL PO) Take by mouth.    [provider]  naproxen (NAPROSYN) 500 MG tablet Take 1 tablet (500 mg total) by mouth 2 (two) times daily. 02/16/20   Collie Kittel C, PA-C  ondansetron (ZOFRAN ODT) 4 MG disintegrating tablet Take 1 tablet (4 mg total) by mouth every 8 (eight) hours as needed for nausea or vomiting. 02/16/20   Arsh Feutz, Junius Creamer, PA-C    Family History Family History  Problem Relation Age of Onset  . Diabetes Mother   . Alcohol abuse Neg Hx   . Asthma Neg Hx   . Arthritis Neg Hx   . Birth defects Neg Hx   . Cancer  Neg Hx   . COPD Neg Hx   . Depression Neg Hx   . Drug abuse Neg Hx   . Early death Neg Hx   . Hearing loss Neg Hx   . Heart disease Neg Hx   . Hyperlipidemia Neg Hx   . Hypertension Neg Hx   . Kidney disease Neg Hx   . Learning disabilities Neg Hx   . Mental illness Neg Hx   . Mental retardation Neg Hx   . Miscarriages / Stillbirths Neg Hx   . Stroke Neg Hx   . Vision loss Neg Hx     Social History Social History   Tobacco Use  . Smoking status: Never Smoker  . Smokeless tobacco: Never Used  Substance Use Topics  . Alcohol use: No  . Drug use: No     Allergies   Patient has no known allergies.   Review of Systems Review of Systems  Constitutional: Negative for fever.  Respiratory: Negative for shortness of breath.   Cardiovascular: Negative for chest pain.  Gastrointestinal: Negative for abdominal pain, diarrhea, nausea and vomiting.  Genitourinary:  Positive for dysuria, frequency and vaginal discharge. Negative for flank pain, genital sores, hematuria, menstrual problem, vaginal bleeding and vaginal pain.  Musculoskeletal: Negative for back pain.  Skin: Negative for rash.  Neurological: Negative for dizziness, light-headedness and headaches.     Physical Exam Triage Vital Signs ED Triage Vitals  Enc Vitals Group     BP 02/16/20 1726 129/75     Pulse Rate 02/16/20 1726 72     Resp 02/16/20 1726 16     Temp 02/16/20 1726 98.3 F (36.8 C)     Temp Source 02/16/20 1726 Oral     SpO2 02/16/20 1726 100 %     Weight 02/16/20 1729 186 lb (84.4 kg)     Height 02/16/20 1729 5\' 2"  (1.575 m)     Head Circumference --      Peak Flow --      Pain Score 02/16/20 1729 10     Pain Loc --      Pain Edu? --      Excl. in GC? --    No data found.  Updated Vital Signs BP 129/75   Pulse 72   Temp 98.3 F (36.8 C) (Oral)   Resp 16   Ht 5\' 2"  (1.575 m)   Wt 186 lb (84.4 kg)   SpO2 100%   BMI 34.02 kg/m   Visual Acuity Right Eye Distance:   Left Eye  Distance:   Bilateral Distance:    Right Eye Near:   Left Eye Near:    Bilateral Near:     Physical Exam Vitals and nursing note reviewed.  Constitutional:      Appearance: She is well-developed.     Comments: No acute distress  HENT:     Head: Normocephalic and atraumatic.     Nose: Nose normal.     Mouth/Throat:     Comments: Oral mucosa pink and moist, no tonsillar enlargement or exudate. Posterior pharynx patent and nonerythematous, no uvula deviation or swelling. Normal phonation. Eyes:     Conjunctiva/sclera: Conjunctivae normal.  Cardiovascular:     Rate and Rhythm: Normal rate and regular rhythm.  Pulmonary:     Effort: Pulmonary effort is normal. No respiratory distress.     Comments: Breathing comfortably at rest, CTABL, no wheezing, rales or other adventitious sounds auscultated Abdominal:     General: There is no distension.     Tenderness: There is abdominal tenderness.     Comments: Soft, nondistended, tender to palpation to bilateral lower quadrants, no focal tenderness, negative rebound, negative Rovsing  Musculoskeletal:        General: Normal range of motion.     Cervical back: Neck supple.  Skin:    General: Skin is warm and dry.  Neurological:     Mental Status: She is alert and oriented to person, place, and time.      UC Treatments / Results  Labs (all labs ordered are listed, but only abnormal results are displayed) Labs Reviewed  POC URINE PREG, ED  POCT URINALYSIS DIPSTICK, ED / UC  CERVICOVAGINAL ANCILLARY ONLY    EKG   Radiology No results found.  Procedures Procedures (including critical care time)  Medications Ordered in UC Medications - No data to display  Initial Impression / Assessment and Plan / UC Course  I have reviewed the triage vital signs and the nursing notes.  Pertinent labs & imaging results that were available during my care of the patient were reviewed by me and considered  in my medical decision making (see  chart for details).     Pregnancy test negative, UA unremarkable.  Vaginal swab pending for any vaginal infections contributing to abdominal pain discharge and burning sensation.  Call with results and provide further treatment as needed.  Recommended to follow-up with OB/GYN if continuing to have irregular cycles.  Discussed strict return precautions. Patient verbalized understanding and is agreeable with plan.  Final Clinical Impressions(s) / UC Diagnoses   Final diagnoses:  Encounter for pregnancy test with result negative  Nausea  Vaginal burning     Discharge Instructions     Urino fue normal Zofran para nausea Naprosyn para dolor Examen vaginal tomo como 1-2 dia para los resultados Ir al OBGYN si  continue tiene problemas con su regla    ED Prescriptions    Medication Sig Dispense Auth. Provider   ondansetron (ZOFRAN ODT) 4 MG disintegrating tablet Take 1 tablet (4 mg total) by mouth every 8 (eight) hours as needed for nausea or vomiting. 20 tablet Logyn Kendrick C, PA-C   naproxen (NAPROSYN) 500 MG tablet Take 1 tablet (500 mg total) by mouth 2 (two) times daily. 30 tablet Maggy Wyble, [place] C, PA-C     PDMP not reviewed this encounter.   Cedricka Sackrider, Trinidad C, PA-C 02/17/20 1022

## 2020-02-19 LAB — CERVICOVAGINAL ANCILLARY ONLY
Bacterial Vaginitis (gardnerella): NEGATIVE
Candida Glabrata: NEGATIVE
Candida Vaginitis: POSITIVE — AB
Chlamydia: NEGATIVE
Comment: NEGATIVE
Comment: NEGATIVE
Comment: NEGATIVE
Comment: NEGATIVE
Comment: NEGATIVE
Comment: NORMAL
Neisseria Gonorrhea: NEGATIVE
Trichomonas: NEGATIVE

## 2020-02-21 ENCOUNTER — Telehealth (HOSPITAL_COMMUNITY): Payer: Self-pay | Admitting: Emergency Medicine

## 2020-02-21 MED ORDER — FLUCONAZOLE 150 MG PO TABS: 150.0000 mg | ORAL_TABLET | Freq: Once | ORAL | 0 refills | Status: AC

## 2020-06-01 NOTE — L&D Delivery Note (Addendum)
OB/GYN Faculty Practice Delivery Note  Shannon Myers is a 35 y.o. [id number] s/p SVD at [redacted]w[redacted]d. She was admitted for IOL (elective).   ROM: 1h 31m with clear fluid GBS Status: Positive Maximum Maternal Temperature: 98.5  Labor Progress: Presented for IOL, received one dose of cytotec and then was AROMed and progressed to complete.   Delivery Date/Time: [id number] on 05/06/2021 Delivery: Called to room and patient was complete and head already delivered precipitously and body followed shortly. No nuchal cord present. Infant with spontaneous cry, placed on mother's abdomen, dried and stimulated. Cord clamped x 2 after 1-minute delay, and cut by father of baby. Cord blood drawn. Placenta delivered spontaneously with gentle cord traction. Fundus firm with massage and Pitocin. Labia, perineum, vagina, and cervix inspected and found to be intact.  Patient was given TXA prophylactically given her grand multiparous status.  Placenta: intact, 3V cord, L&D Complications: None Lacerations: None EBL: 50cc Analgesia: epidural  Infant: female  APGARs 7,9  weight pending  Warner Mccreedy, MD, MPH OB Fellow, Faculty Practice Center for [facility], [facility] 05/06/2021, 8:52 AM

## 2020-06-25 ENCOUNTER — Encounter (HOSPITAL_COMMUNITY): Payer: Self-pay

## 2020-06-25 ENCOUNTER — Inpatient Hospital Stay (HOSPITAL_COMMUNITY): Payer: Self-pay

## 2020-06-25 ENCOUNTER — Inpatient Hospital Stay (HOSPITAL_COMMUNITY)
Admission: AD | Admit: 2020-06-25 | Discharge: 2020-06-26 | Disposition: A | Discharge status: Home / Self Care | Payer: Self-pay | Attending: Obstetrics & Gynecology | Admitting: Obstetrics & Gynecology

## 2020-06-25 DIAGNOSIS — O209 Hemorrhage in early pregnancy, unspecified: Secondary | ICD-10-CM

## 2020-06-25 DIAGNOSIS — O034 Incomplete spontaneous abortion without complication: Secondary | ICD-10-CM | POA: Insufficient documentation

## 2020-06-25 DIAGNOSIS — Z3A01 Less than 8 weeks gestation of pregnancy: Secondary | ICD-10-CM | POA: Insufficient documentation

## 2020-06-25 LAB — POCT PREGNANCY, URINE: Preg Test, Ur: POSITIVE — AB

## 2020-06-25 LAB — CBC
HCT: 38.1 % (ref 36.0–46.0)
Hemoglobin: 13.1 g/dL (ref 12.0–15.0)
MCH: 30 pg (ref 26.0–34.0)
MCHC: 34.4 g/dL (ref 30.0–36.0)
MCV: 87.2 fL (ref 80.0–100.0)
Platelets: 220 10*3/uL (ref 150–400)
RBC: 4.37 MIL/uL (ref 3.87–5.11)
RDW: 11.9 % (ref 11.5–15.5)
WBC: 9.6 10*3/uL (ref 4.0–10.5)
nRBC: 0 % (ref 0.0–0.2)

## 2020-06-25 LAB — WET PREP, GENITAL
Clue Cells Wet Prep HPF POC: NONE SEEN
Sperm: NONE SEEN
Trich, Wet Prep: NONE SEEN
Yeast Wet Prep HPF POC: NONE SEEN

## 2020-06-25 LAB — URINALYSIS, ROUTINE W REFLEX MICROSCOPIC
Bilirubin Urine: NEGATIVE
Glucose, UA: NEGATIVE mg/dL
Ketones, ur: NEGATIVE mg/dL
Leukocytes,Ua: NEGATIVE
Nitrite: NEGATIVE
Protein, ur: NEGATIVE mg/dL
Specific Gravity, Urine: 1.012 (ref 1.005–1.030)
pH: 7 (ref 5.0–8.0)

## 2020-06-25 LAB — HIV ANTIBODY (ROUTINE TESTING W REFLEX): HIV Screen 4th Generation wRfx: NONREACTIVE

## 2020-06-25 LAB — HCG, QUANTITATIVE, PREGNANCY: hCG, Beta Chain, Quant, S: 193 m[IU]/mL — ABNORMAL HIGH (ref <5)

## 2020-06-25 MED ORDER — IBUPROFEN 600 MG PO TABS: 600.0000 mg | ORAL_TABLET | Freq: Four times a day (QID) | ORAL | 0 refills | Status: DC | PRN

## 2020-06-25 MED ORDER — PROMETHAZINE HCL 12.5 MG PO TABS: 12.5000 mg | ORAL_TABLET | Freq: Four times a day (QID) | ORAL | 0 refills | Status: DC | PRN

## 2020-06-25 MED ORDER — MISOPROSTOL 200 MCG PO TABS
800.0000 ug | ORAL_TABLET | Freq: Once | ORAL | Status: AC
  Administered 2020-06-25: 800 ug via ORAL
  Filled 2020-06-25: qty 4

## 2020-06-25 MED ORDER — OXYCODONE-ACETAMINOPHEN 5-325 MG PO TABS
1.0000 | ORAL_TABLET | Freq: Four times a day (QID) | ORAL | Status: DC | PRN
  Administered 2020-06-25: 1 via ORAL
  Filled 2020-06-25: qty 1

## 2020-06-25 MED ORDER — ACETAMINOPHEN-CODEINE #3 300-30 MG PO TABS: 1.0000 | ORAL_TABLET | Freq: Four times a day (QID) | ORAL | 0 refills | Status: DC | PRN

## 2020-06-25 MED ORDER — IBUPROFEN 600 MG PO TABS
600.0000 mg | ORAL_TABLET | Freq: Once | ORAL | Status: AC
  Administered 2020-06-25: 600 mg via ORAL
  Filled 2020-06-25: qty 1

## 2020-06-25 NOTE — Discharge Instructions (Signed)
Regreso a MAU: - Si tiene un sangrado ms abundante que empapa ms de 2 toallas higinicas por hora durante una hora o ms - Si sangra tanto que siente que podra desmayarse o se desmaya - Si tiene dolor abdominal significativo que no mejora con Tylenol 1000 mg cada 6 horas segn sea necesario para el dolor - Si desarrolla fiebre > 100.5

## 2020-06-25 NOTE — MAU Provider Note (Signed)
History     CSN: [id number]  Arrival date and time: 06/25/20 1243   Event Date/Time   First Provider Initiated Contact with Patient 06/25/20 1421      Chief Complaint  Patient presents with  . Vaginal Bleeding   Ms. Shannon Myers is a 35 y.o. year old [id number] female at [redacted]w[redacted]d weeks gestation by LMP on 04/03/2020 who presents to MAU reporting intermittent VB started Friday 06/21/2020. Her last SI was 2 weeks ago. She reports some abdominal pain; rated 8/10. She has not sought [facility] at this time, but plans to receive [facility] with [facility].   OB History    Gravida  7   Para  6   Term  5   Preterm  1   AB      Living  6     SAB      IAB      Ectopic      Multiple  0   Live Births  6           Past Medical History:  Diagnosis Date  . Anemia   . Preterm labor   . Vaginal bleeding in pregnancy    Last pregnancy    Past Surgical History:  Procedure Laterality Date  . NO PAST SURGERIES      Family History  Problem Relation Age of Onset  . Healthy Mother   . Healthy Father   . Alcohol abuse Neg Hx   . Asthma Neg Hx   . Arthritis Neg Hx   . Birth defects Neg Hx   . Cancer Neg Hx   . COPD Neg Hx   . Depression Neg Hx   . Drug abuse Neg Hx   . Early death Neg Hx   . Hearing loss Neg Hx   . Heart disease Neg Hx   . Hyperlipidemia Neg Hx   . Hypertension Neg Hx   . Kidney disease Neg Hx   . Learning disabilities Neg Hx   . Mental illness Neg Hx   . Mental retardation Neg Hx   . Miscarriages / Stillbirths Neg Hx   . Stroke Neg Hx   . Vision loss Neg Hx     Social History   Tobacco Use  . Smoking status: Never Smoker  . Smokeless tobacco: Never Used  Vaping Use  . Vaping Use: Never used  Substance Use Topics  . Alcohol use: No  . Drug use: No    Allergies: No Known Allergies  Medications Prior to Admission  Medication Sig Dispense Refill Last Dose  . Acetaminophen (TYLENOL PO) Take by mouth.     . naproxen (NAPROSYN) 500 MG tablet Take 1  tablet (500 mg total) by mouth 2 (two) times daily. 30 tablet 0   . norethindrone (MICRONOR) 0.35 MG tablet Take by mouth.     . ondansetron (ZOFRAN ODT) 4 MG disintegrating tablet Take 1 tablet (4 mg total) by mouth every 8 (eight) hours as needed for nausea or vomiting. 20 tablet 0     Review of Systems  Constitutional: Negative.   HENT: Negative.   Eyes: Negative.   Respiratory: Negative.   Cardiovascular: Negative.   Gastrointestinal: Negative.   Endocrine: Negative.   Genitourinary: Positive for pelvic pain and vaginal bleeding (off & on since Firday 06/21/2020).  Musculoskeletal: Negative.   Skin: Negative.   Allergic/Immunologic: Negative.   Neurological: Negative.   Hematological: Negative.   Psychiatric/Behavioral: Negative.    Physical Exam   Blood  pressure 118/65, pulse 88, temperature 98.1 F (36.7 C), temperature source Oral, resp. rate 18, weight 95.8 kg, last menstrual period 04/03/2020, SpO2 96 %, unknown if currently breastfeeding.  Physical Exam Constitutional:      Appearance: Normal appearance. She is obese.  HENT:     Head: Normocephalic and atraumatic.  Cardiovascular:     Rate and Rhythm: Normal rate.     Pulses: Normal pulses.  Pulmonary:     Effort: Pulmonary effort is normal.  Abdominal:     Palpations: Abdomen is soft.  Genitourinary:    Comments: Uterus: mildly tender, SE: cervix is smooth, pink, no lesions, scant amt of dark red, blood in vaginal vault -- WP, GC/CT done, closed/long/firm, (+) CMT, no friability, moderate bilateral adnexal tenderness  Musculoskeletal:     Cervical back: Normal range of motion.  Skin:    General: Skin is warm and dry.  Neurological:     Mental Status: She is alert and oriented to person, place, and time.  Psychiatric:        Mood and Affect: Mood normal.        Behavior: Behavior normal.        Thought Content: Thought content normal.        Judgment: Judgment normal.     MAU Course   Procedures  MDM CCUA UPT CBC ABO/Rh HCG Wet Prep GC/CT -- pending HIV -- pending OB < 14 wks [place] with TV Ibuprofen 600 mg po -- pain resolved Discussed U/S results of failed pregnancy with patient and options of: expectant management or Cytotec -- patient chooses cytotec  Early Intrauterine Pregnancy Failure Protocol X  Documented intrauterine pregnancy failure less than or equal to [redacted] weeks   gestation  X  No serious current illness  X  Baseline Hgb greater than or equal to 10g/dl  X  Patient has easily accessible transportation to the hospital  X  Clear preference  X  Practitioner/physician deems patient reliable  X  Counseling by practitioner or physician  X  Patient education by RN  X  Consent form signed       Rho-Gam given by RN if indicated  X  Medication dispensed  _  Cytotec 800 mcg Intravaginally by patient at home   X   Intravaginally by RN in MAU       Rectally by patient at home       Rectally by RN in MAU  X   Ibuprofen 600 mg 1 tablet by mouth every 6 hours as needed #30 - prescribed  X   Tylenol #3 mg by mouth every 4 to 6 hours as needed - prescribed  X   Phenergan 12.5 mg by mouth every 4 hours as needed for nausea - prescribed  Results for orders placed or performed during the hospital encounter of 06/25/20 (from the past 24 hour(s))  Pregnancy, urine POC     Status: Abnormal   Collection Time: 06/25/20  1:38 PM  Result Value Ref Range   Preg Test, Ur POSITIVE (A) NEGATIVE  HIV Antibody (routine testing w rflx)     Status: None   Collection Time: 06/25/20  1:41 PM  Result Value Ref Range   HIV Screen 4th Generation wRfx Non Reactive Non Reactive  CBC     Status: None   Collection Time: 06/25/20  1:41 PM  Result Value Ref Range   WBC 9.6 4.0 - 10.5 K/uL   RBC 4.37 3.87 - 5.11 MIL/uL   Hemoglobin  13.1 12.0 - 15.0 g/dL   HCT 16.1 09.6 - 04.5 %   MCV 87.2 80.0 - 100.0 fL   MCH 30.0 26.0 - 34.0 pg   MCHC 34.4 30.0 - 36.0 g/dL   RDW 40.9 81.1 - 91.4  %   Platelets 220 150 - 400 K/uL   nRBC 0.0 0.0 - 0.2 %  hCG, quantitative, pregnancy     Status: Abnormal   Collection Time: 06/25/20  1:41 PM  Result Value Ref Range   hCG, Beta Chain, Quant, S 193 (H) <5 mIU/mL  Wet prep, genital     Status: Abnormal   Collection Time: 06/25/20  2:36 PM  Result Value Ref Range   Yeast Wet Prep HPF POC NONE SEEN NONE SEEN   Trich, Wet Prep NONE SEEN NONE SEEN   Clue Cells Wet Prep HPF POC NONE SEEN NONE SEEN   WBC, Wet Prep HPF POC FEW (A) NONE SEEN   Sperm NONE SEEN   Urinalysis, Routine w reflex microscopic Urine, Clean Catch     Status: Abnormal   Collection Time: 06/25/20  3:46 PM  Result Value Ref Range   Color, Urine YELLOW YELLOW   APPearance HAZY (A) CLEAR   Specific Gravity, Urine 1.012 1.005 - 1.030   pH 7.0 5.0 - 8.0   Glucose, UA NEGATIVE NEGATIVE mg/dL   Hgb urine dipstick LARGE (A) NEGATIVE   Bilirubin Urine NEGATIVE NEGATIVE   Ketones, ur NEGATIVE NEGATIVE mg/dL   Protein, ur NEGATIVE NEGATIVE mg/dL   Nitrite NEGATIVE NEGATIVE   Leukocytes,Ua NEGATIVE NEGATIVE   RBC / HPF 0-5 0 - 5 RBC/hpf   WBC, UA 0-5 0 - 5 WBC/hpf   Bacteria, UA RARE (A) NONE SEEN   Squamous Epithelial / LPF 6-10 0 - 5   Mucus PRESENT     US OB LESS THAN 14 WEEKS WITH OB TRANSVAGINAL  Result Date: 06/25/2020 CLINICAL DATA:  Vaginal bleeding, positive pregnancy test EXAM: OBSTETRIC <14 WK [place] AND TRANSVAGINAL OB US TECHNIQUE: Both transabdominal and transvaginal ultrasound examinations were performed for complete evaluation of the gestation as well as the maternal uterus, adnexal regions, and pelvic cul-de-sac. Transvaginal technique was performed to assess early pregnancy. COMPARISON:  None. FINDINGS: Intrauterine gestational sac: Present, scattered echoes are noted throughout the gestational sac. No embryo or yolk sac is noted. Yolk sac:  Absent Embryo:  Absent MSD: 38.1 mm   9 w   1 d Subchorionic hemorrhage:  None visualized. Maternal uterus/adnexae: Within  normal limits. IMPRESSION: Findings meet definitive criteria for failed pregnancy. This follows SRU consensus guidelines: Diagnostic Criteria for Nonviable Pregnancy Early in the First Trimester. Macy Mis J Med [id number]. Electronically Signed   By: Alcide Clever M.D.   On: 06/25/2020 15:34     Assessment and Plan  Incomplete miscarriage -Reviewed with pt cytotec procedure.  Pt verbalizes that she lives close to the hospital and has transportation readily available.  Pt appears reliable and verbalizes understanding and agrees with plan of care - Information provided on incomplete miscarriage Return to MAU:  If you have heavier bleeding that soaks through more that 2 pads per hour for an hour or more  If you bleed so much that you feel like you might pass out or you do pass out  If you have significant abdominal pain that is not improved with Tylenol 1000 mg every 6 hours as needed for pain  If you develop a fever > 100.5 - Follow-up with [facility] in 2 weeks  with a provider.  Message sent to admin pool - Patient verbalized an understanding of the plan of care and agrees.      Raelyn Moraolitta Dawson, CNM 06/25/2020, 2:22 PM

## 2020-06-25 NOTE — MAU Note (Signed)
Patient states she is about [redacted] weeks pregnant.  LMP 04/03/20.  Started having vaginal bleeding on Friday that comes and goes.  Denies recent intercourse.  Denies any cramping.  Plans to receive PNC at [facility].

## 2020-06-26 LAB — GC/CHLAMYDIA PROBE AMP (~~LOC~~) NOT AT ARMC
Chlamydia: NEGATIVE
Comment: NEGATIVE
Comment: NORMAL
Neisseria Gonorrhea: NEGATIVE

## 2020-07-09 ENCOUNTER — Ambulatory Visit: Payer: Self-pay | Admitting: Family Medicine

## 2020-07-14 ENCOUNTER — Inpatient Hospital Stay (HOSPITAL_COMMUNITY)
Admission: AD | Admit: 2020-07-14 | Discharge: 2020-07-14 | Disposition: A | Discharge status: Home / Self Care | Payer: Self-pay | Attending: Family Medicine | Admitting: Family Medicine

## 2020-07-14 ENCOUNTER — Inpatient Hospital Stay (HOSPITAL_COMMUNITY): Payer: Self-pay

## 2020-07-14 ENCOUNTER — Other Ambulatory Visit: Payer: Self-pay

## 2020-07-14 DIAGNOSIS — N939 Abnormal uterine and vaginal bleeding, unspecified: Secondary | ICD-10-CM

## 2020-07-14 DIAGNOSIS — O039 Complete or unspecified spontaneous abortion without complication: Secondary | ICD-10-CM | POA: Insufficient documentation

## 2020-07-14 DIAGNOSIS — Z8759 Personal history of other complications of pregnancy, childbirth and the puerperium: Secondary | ICD-10-CM

## 2020-07-14 LAB — CBC WITH DIFFERENTIAL/PLATELET
Abs Immature Granulocytes: 0.05 10*3/uL (ref 0.00–0.07)
Basophils Absolute: 0 10*3/uL (ref 0.0–0.1)
Basophils Relative: 1 %
Eosinophils Absolute: 0.2 10*3/uL (ref 0.0–0.5)
Eosinophils Relative: 2 %
HCT: 37 % (ref 36.0–46.0)
Hemoglobin: 12.6 g/dL (ref 12.0–15.0)
Immature Granulocytes: 1 %
Lymphocytes Relative: 33 %
Lymphs Abs: 2.7 10*3/uL (ref 0.7–4.0)
MCH: 29.6 pg (ref 26.0–34.0)
MCHC: 34.1 g/dL (ref 30.0–36.0)
MCV: 87.1 fL (ref 80.0–100.0)
Monocytes Absolute: 0.7 10*3/uL (ref 0.1–1.0)
Monocytes Relative: 8 %
Neutro Abs: 4.6 10*3/uL (ref 1.7–7.7)
Neutrophils Relative %: 55 %
Platelets: 293 10*3/uL (ref 150–400)
RBC: 4.25 MIL/uL (ref 3.87–5.11)
RDW: 12 % (ref 11.5–15.5)
WBC: 8.3 10*3/uL (ref 4.0–10.5)
nRBC: 0 % (ref 0.0–0.2)

## 2020-07-14 LAB — HCG, QUANTITATIVE, PREGNANCY: hCG, Beta Chain, Quant, S: 49 m[IU]/mL — ABNORMAL HIGH (ref <5)

## 2020-07-14 NOTE — Discharge Instructions (Signed)
Como sobrellevar la prdida de Nutritional therapist Managing Pregnancy Loss La prdida del Control and instrumentation engineer ocurrir en cualquier momento durante un Media planner. Generalmente no se conoce la causa. Rara vez se debe a algo que usted hizo. La prdida del Media planner a comienzos del Media planner (Forensic scientist trimestre) se denomina aborto espontneo. Este es el tipo de prdida de un embarazo ms frecuente. La prdida del embarazo que ocurre despus de las 20 semanas de embarazo se denomina muerte fetal si el corazn del beb deja de latir antes del nacimiento. La muerte fetal es mucho menos frecuente. Algunas mujeres experimentan trabajo de parto espontneo poco despus de la muerte fetal, lo que ocasiona el nacimiento del beb muerto. Cualquier prdida de Nutritional therapist puede ser devastadora. Tendr que recuperarse fsica y emocionalmente. La mayora de las mujeres son capaces de quedar embarazadas nuevamente despus de la prdida de un Media planner y dar a luz un beb sano. Cmo manejar la recuperacin emocional La prdida de un embarazo es muy difcil emocionalmente. Es posible que sienta muchas emociones distintas mientras hace el duelo. Puede sentirse triste y [place]. Tambin puede sentir culpa. Es normal tener perodos de llanto. La recuperacin emocional puede llevar ms tiempo que la recuperacin fsica. Es diferente para Higher education careers adviser. Estas medidas pueden ayudarla a sobrellevar esta prdida:  Recuerde que es poco probable que haya hecho algo para causar la prdida del Media planner.  Comparta sus pensamientos y sentimientos con su pareja, familiares y Biomedical engineer. Recuerde que su pareja tambin se est recuperando emocionalmente.  Asegrese de tener un buen sistema de apoyo. No pase demasiado tiempo sola.  Renase con un consejero sobre prdida de [place] o nase a un grupo de apoyo para prdida de Building control surveyor.  Duerma lo suficiente y siga una dieta sana. Regrese a la actividad fsica con regularidad cuando se haya recuperado  fsicamente.  No consuma drogas ni alcohol para controlar sus emociones.  Considere consultar a un profesional de salud mental para que la ayude a Chief Strategy Officer.  Pdale a un amigo o un ser querido que la ayude a decidir qu hacer con la ropa y los artculos infantiles que recibi para el beb. En el caso de muerte fetal, muchas mujeres se benefician al tomar [employer] en el proceso del duelo. Es recomendable que:  Sostenga al beb despus del nacimiento.  Le ponga un nombre al beb.  Solicite un certificado de nacimiento.  Cree un recuerdo, Dana los pies.  Vista al beb y pida que le tomen una fotografa.  Haga arreglos para Furniture conservator/restorer.  Solicite un bautismo o una bendicin. Los hospitales tienen personal que puede ayudarla a Programme researcher, broadcasting/film/video todo esto.   Cmo reconocer el estrs emocional Es normal tener estrs emocional despus de perder un Media planner. Pero el estrs emocional que dura mucho tiempo o se vuelve grave requiere tratamiento. Tenga cuidado con estos signos de estrs emocional grave:  Tristeza, ira o culpa, que no desaparecen y estn interfiriendo en sus actividades normales.  Problemas de relacin que hayan surgido o empeorado desde la prdida del Media planner.  Signos de depresin que duran ms de 2 semanas. Pueden incluir: ? Tristeza. ? Ansiedad. ? Desesperanza. ? Falta de inters en las actividades que disfruta. ? Incapacidad para concentrarse. ? Dificultad para dormir o dormir demasiado. ? Prdida del apetito o comer en exceso. ? Pensamientos sobre la muerte o de [place] dao a s misma. Siga estas instrucciones en su casa:  Use los medicamentos de venta  libre y los Science writer se lo haya indicado el mdico.  Haga reposo en su casa hasta que su nivel de energa regrese. Reanude sus actividades normales segn lo indicado por el mdico. Pregntele al mdico qu actividades son seguras para  usted.  Cuando est lista, visite a su mdico para hablar [employer] debe tomar para futuros embarazos.  Concurra a todas las visitas de [address] se lo haya indicado el mdico. Esto es importante. Dnde encontrar apoyo  Para que usted y su pareja puedan sobrellevar el proceso del duelo, hable con su mdico o busque apoyo psicolgico.  Considere la posibilidad de reunirse con otras mujeres que hayan sufrido la prdida de Chartered loss adjuster. Consulte al [employer] distintos grupos de apoyo y recursos. Dnde buscar ms informacin  U.S. Department of Health and [employer], Office on [address] Health (Departamento de Salud y [place] de los [place] de Salud de la Mujer): http://hoffman.com/  American Pregnancy Association (Asociacin Americana del [place]): BroadwayMovies.se. Comunquese con un mdico si:  Contina sintiendo pena, tristeza o falta de motivacin para realizar las actividades diarias, y estos sentimientos no mejoran con Museum/gallery conservator.  Tiene problemas para recuperarse emocionalmente, en especial si est consumiendo alcohol o sustancias para ayudar. Solicite ayuda inmediatamente si:  Piensa en lastimarse a usted misma o a Economist. Si alguna vez siente que puede lastimarse a usted misma o Physicist, medical a Economist, o tiene pensamientos de poner fin a su vida, busque ayuda de inmediato. Puede dirigirse al servicio de emergencias ms cercano o comunicarse con:  El servicio de emergencias de su localidad (911 en EE.[place].).  Una lnea de asistencia al suicida y Visual merchandiser en crisis, como National Suicide Prevention Lifeline (Lnea Nacional de Prevencin del Suicidio), al (787) 154-7934. Est disponible las 24 horas del da. Resumen  Cualquier prdida de Chartered loss adjuster puede ser difcil fsica y emocionalmente.  Es posible que tenga muchas emociones distintas mientras hace el duelo. La recuperacin emocional puede durar ms tiempo  que la recuperacin fsica.  Es normal tener estrs emocional despus de perder Chartered loss adjuster. Pero el estrs emocional que dura mucho tiempo o se vuelve grave requiere tratamiento.  Consulte a su mdico si tiene problemas emocionales despus de la prdida de Chartered loss adjuster. Esta informacin no tiene Theme park manager el consejo del mdico. Asegrese de hacerle al mdico cualquier pregunta que tenga. Document Revised: 09/12/2018 Document Reviewed: 08/28/2017 Elsevier Patient Education  2021 [employer].

## 2020-07-14 NOTE — MAU Provider Note (Signed)
History     CSN: [id number]  Arrival date and time: 07/14/20 1351   Event Date/Time   First Provider Initiated Contact with Patient 07/14/20 1628      Chief Complaint  Patient presents with  . Vaginal Bleeding   HPI Shannon Myers is a 35 y.o. [id number] s/p cytotec on 1/26 for a missed AB who presents with vaginal bleeding. She reports she has seen some vaginal bleeding every day since. She reports some days it's heavy and some days its spotting. She denies any pain. She did not keep her follow up appointment on 2/8.   OB History    Gravida  7   Para  6   Term  5   Preterm  1   AB      Living  6     SAB      IAB      Ectopic      Multiple  0   Live Births  6           Past Medical History:  Diagnosis Date  . Anemia   . Preterm labor   . Vaginal bleeding in pregnancy    Last pregnancy    Past Surgical History:  Procedure Laterality Date  . NO PAST SURGERIES      Family History  Problem Relation Age of Onset  . Healthy Mother   . Healthy Father   . Alcohol abuse Neg Hx   . Asthma Neg Hx   . Arthritis Neg Hx   . Birth defects Neg Hx   . Cancer Neg Hx   . COPD Neg Hx   . Depression Neg Hx   . Drug abuse Neg Hx   . Early death Neg Hx   . Hearing loss Neg Hx   . Heart disease Neg Hx   . Hyperlipidemia Neg Hx   . Hypertension Neg Hx   . Kidney disease Neg Hx   . Learning disabilities Neg Hx   . Mental illness Neg Hx   . Mental retardation Neg Hx   . Miscarriages / Stillbirths Neg Hx   . Stroke Neg Hx   . Vision loss Neg Hx     Social History   Tobacco Use  . Smoking status: Never Smoker  . Smokeless tobacco: Never Used  Vaping Use  . Vaping Use: Never used  Substance Use Topics  . Alcohol use: No  . Drug use: No    Allergies: No Known Allergies  Medications Prior to Admission  Medication Sig Dispense Refill Last Dose  . acetaminophen-codeine (TYLENOL #3) 300-30 MG tablet Take 1-2 tablets by mouth every 6 (six) hours as  needed for moderate pain. 15 tablet 0 07/13/2020 at 2200  . ibuprofen (ADVIL) 600 MG tablet Take 1 tablet (600 mg total) by mouth every 6 (six) hours as needed for moderate pain. 60 tablet 0 07/13/2020 at 2200  . promethazine (PHENERGAN) 12.5 MG tablet Take 1 tablet (12.5 mg total) by mouth every 6 (six) hours as needed for nausea or vomiting. 30 tablet 0 07/13/2020 at 2200    Review of Systems  Constitutional: Negative.  Negative for fatigue and fever.  HENT: Negative.   Respiratory: Negative.  Negative for shortness of breath.   Cardiovascular: Negative.  Negative for chest pain.  Gastrointestinal: Negative.  Negative for abdominal pain, constipation, diarrhea, nausea and vomiting.  Genitourinary: Positive for vaginal bleeding. Negative for dysuria and vaginal discharge.  Neurological: Negative.  Negative for dizziness and  headaches.   Physical Exam   Blood pressure 129/75, pulse 88, temperature 98 F (36.7 C), temperature source Oral, resp. rate 16, last menstrual period 04/03/2020, SpO2 100 %, unknown if currently breastfeeding.  Physical Exam Vitals and nursing note reviewed.  Constitutional:      General: She is not in acute distress.    Appearance: She is well-developed and well-nourished.  HENT:     Head: Normocephalic.  Eyes:     Pupils: Pupils are equal, round, and reactive to light.  Cardiovascular:     Rate and Rhythm: Normal rate and regular rhythm.     Heart sounds: Normal heart sounds.  Pulmonary:     Effort: Pulmonary effort is normal. No respiratory distress.     Breath sounds: Normal breath sounds.  Abdominal:     General: Bowel sounds are normal. There is no distension.     Palpations: Abdomen is soft.     Tenderness: There is no abdominal tenderness.  Genitourinary:    Comments: Small amount of bright red blood in vault. No active bleeding. Cervix closed/thick Skin:    General: Skin is warm and dry.  Neurological:     Mental Status: She is alert and  oriented to person, place, and time.  Psychiatric:        Mood and Affect: Mood and affect normal.        Behavior: Behavior normal.        Thought Content: Thought content normal.        Judgment: Judgment normal.     MAU Course  Procedures Results for orders placed or performed during the hospital encounter of 07/14/20 (from the past 24 hour(s))  CBC with Differential/Platelet     Status: None   Collection Time: 07/14/20  2:40 PM  Result Value Ref Range   WBC 8.3 4.0 - 10.5 K/uL   RBC 4.25 3.87 - 5.11 MIL/uL   Hemoglobin 12.6 12.0 - 15.0 g/dL   HCT 10.3 15.9 - 45.8 %   MCV 87.1 80.0 - 100.0 fL   MCH 29.6 26.0 - 34.0 pg   MCHC 34.1 30.0 - 36.0 g/dL   RDW 59.2 92.4 - 46.2 %   Platelets 293 150 - 400 K/uL   nRBC 0.0 0.0 - 0.2 %   Neutrophils Relative % 55 %   Neutro Abs 4.6 1.7 - 7.7 K/uL   Lymphocytes Relative 33 %   Lymphs Abs 2.7 0.7 - 4.0 K/uL   Monocytes Relative 8 %   Monocytes Absolute 0.7 0.1 - 1.0 K/uL   Eosinophils Relative 2 %   Eosinophils Absolute 0.2 0.0 - 0.5 K/uL   Basophils Relative 1 %   Basophils Absolute 0.0 0.0 - 0.1 K/uL   Immature Granulocytes 1 %   Abs Immature Granulocytes 0.05 0.00 - 0.07 K/uL  hCG, quantitative, pregnancy     Status: Abnormal   Collection Time: 07/14/20  2:40 PM  Result Value Ref Range   hCG, Beta Chain, Quant, S 49 (H) <5 mIU/mL   US OB Transvaginal  Result Date: 07/14/2020 CLINICAL DATA:  Vaginal bleeding with concern for missed spontaneous abortion. Persistent positive beta HCG value although decreasing EXAM: TRANSVAGINAL OB ULTRASOUND TECHNIQUE: Transvaginal ultrasound was performed for complete evaluation of the gestation as well as the maternal uterus, adnexal regions, and pelvic cul-de-sac. COMPARISON:  June 25, 2020 FINDINGS: Intrauterine gestational sac: Not visualized Yolk sac:  Not visualized Embryo:  Not visualized Cardiac Activity: Not visualized Subchorionic hemorrhage:  None visualized. Maternal  uterus/adnexae:  Cervical os closed. Small amount of fluid seen in the endometrium. Endometrial thickness is 5 mm. Note that previously noted gestational sac is no longer evident. Right ovary measures 2.6 x 1.3 x 1.2 cm. Left ovary measures 4.2 x 4.0 x 2.3 cm. No extrauterine pelvic mass or free fluid. IMPRESSION: Findings which are felt to be indicative recent spontaneous abortion. Gestational sac no longer seen. Endometrium currently measures 5 mm in thickness with a small amount of fluid within the endometrium. There is no extrauterine pelvic mass or fluid. Continued clinical surveillance advised until beta HCG falls to 0. If beta hCG does not continue diminishing, repeat ultrasound may be indicated for further assessment. Electronically Signed   By: Bretta Bang III M.D.   On: 07/14/2020 16:23   MDM CBC with Diff HCG US OB transvaginal  Discussed expectations for bleeding post miscarriage at length. Reassured of normalcy of exam today and emphasized importance of keeping follow up appointments. Message sent to clinic to schedule for repeat HCG in 1 week  Assessment and Plan   1. Vaginal bleeding   2. Miscarriage    -Discharge home in stable condition - Vaginal bleeding precautions discussed -Patient advised to follow-up with [facility] in 1 week for repeat HCG -Patient may return to MAU as needed or if her condition were to change or worsen   Rolm Bookbinder CNM 07/14/2020, 4:29 PM

## 2020-07-14 NOTE — MAU Note (Signed)
Shannon Myers is a 35 y.o. at [redacted]w[redacted]d here in MAU reporting: took cytotec the day after previous MAU visit for incomplete SAB. States she is still bleeding. Had fever for 3 days last week. Is changing a pad 4-5 times per hour, some are soaked and some are not. Having some small clots. Reports a lot of pain last night but none today.  Onset of complaint: ongoing  Pain score: 0/10  Vitals:   07/14/20 1430  BP: 129/75  Pulse: 88  Resp: 16  Temp: 98 F (36.7 C)  SpO2: 100%     Lab orders placed from triage: none

## 2020-07-17 ENCOUNTER — Ambulatory Visit: Payer: Self-pay | Admitting: Family Medicine

## 2020-07-19 ENCOUNTER — Other Ambulatory Visit: Payer: Self-pay | Admitting: *Deleted

## 2020-07-19 DIAGNOSIS — O039 Complete or unspecified spontaneous abortion without complication: Secondary | ICD-10-CM

## 2020-07-22 ENCOUNTER — Other Ambulatory Visit: Payer: Self-pay

## 2020-07-22 ENCOUNTER — Encounter: Payer: Self-pay | Admitting: Family Medicine

## 2020-07-29 ENCOUNTER — Encounter: Payer: Self-pay | Admitting: Student

## 2020-07-29 ENCOUNTER — Other Ambulatory Visit: Payer: Self-pay

## 2020-07-29 ENCOUNTER — Ambulatory Visit (INDEPENDENT_AMBULATORY_CARE_PROVIDER_SITE_OTHER): Payer: Self-pay | Admitting: Student

## 2020-07-29 VITALS — BP 111/68 | HR 68 | Wt 213.4 lb

## 2020-07-29 DIAGNOSIS — Z30011 Encounter for initial prescription of contraceptive pills: Secondary | ICD-10-CM

## 2020-07-29 DIAGNOSIS — O039 Complete or unspecified spontaneous abortion without complication: Secondary | ICD-10-CM

## 2020-07-29 MED ORDER — NORETHIN ACE-ETH ESTRAD-FE 1-20 MG-MCG PO TABS: 1.0000 | ORAL_TABLET | Freq: Every day | ORAL | 11 refills | Status: DC

## 2020-07-29 NOTE — Progress Notes (Signed)
History:  Shannon Myers is a 35 y.o. [phone] who presents to clinic today for miscarriage follow up. Was last seen in MAU on 2/13 & hcg was down to 49. Ultrasound that day showed no IUP or adnexal masses.   Since then has had intermittent vaginal bleeding that alternates dark red and brown. Goes through 3 pads per day. Not saturating pads or passing blood clots. Also reports intermittent pain in left lower quadrant of abdomen. Denies fever, n/v/d, constipation.   Patient Active Problem List   Diagnosis Date Noted  . History of preterm delivery, currently pregnant 07/29/2016  . Obesity 01/19/2013    No Known Allergies  Current Outpatient Medications on File Prior to Visit  Medication Sig Dispense Refill  . ibuprofen (ADVIL) 600 MG tablet Take 1 tablet (600 mg total) by mouth every 6 (six) hours as needed for moderate pain. 60 tablet 0  . promethazine (PHENERGAN) 12.5 MG tablet Take 1 tablet (12.5 mg total) by mouth every 6 (six) hours as needed for nausea or vomiting. 30 tablet 0   No current facility-administered medications on file prior to visit.     The following portions of the patient's history were reviewed and updated as appropriate: allergies, current medications, family history, past medical history, social history, past surgical history and problem list.  Review of Systems:  Other than those mentioned in HPI all ROS negative   Objective:  Physical Exam BP 111/68   Pulse 68   Wt 213 lb 6.4 oz (96.8 kg)   LMP 04/03/2020 (Exact Date)   Breastfeeding Unknown   BMI 39.03 kg/m  CONSTITUTIONAL: Well-developed, well-nourished female in no acute distress.  EYES: EOM intact, conjunctivae normal, no scleral icterus HEAD: Normocephalic, atraumatic RESPIRATORY: Effort normal, no problems with respiration noted. GASTROINTESTINAL:Soft, normal bowel sounds, no distention noted.  No tenderness, rebound or guarding.  SKIN: Skin is warm and dry. No rash noted. Not diaphoretic.  No erythema. No pallor. PSYCHIATRIC: Normal mood and affect. Normal behavior. Normal judgment and thought content.  Labs and Imaging HCG ordered today  Assessment & Plan:  1. Miscarriage -will reassess HCG today to confirm down to negative. Reviewed labs & ultrasound from previous visits. No concern for RPOCS.  -reports last pap smear was over 3 years ago. Referral sent to [facility].  - Beta hCG quant (ref lab)  2. Encounter for initial prescription of contraceptive pills -Reviewed all forms of birth control options available including abstinence; over the counter/barrier methods; hormonal contraceptive medication including pill, patch, ring, injection,contraceptive implant; hormonal and nonhormonal IUDs; permanent sterilization options including vasectomy and the various tubal sterilization modalities. Risks and benefits reviewed.  Questions were answered.    - norethindrone-ethinyl estradiol (LOESTRIN FE) 1-20 MG-MCG tablet; Take 1 tablet by mouth daily.  Dispense: 30 tablet; Refill: 11   Judeth Horn, NP 07/29/2020 10:07 AM

## 2020-07-29 NOTE — Progress Notes (Signed)
Pt reports continued vaginal bleeding. Changing a pad 4-5 times per day at heaviest. For the past few days pt has been changing pad 3 times daily. Reports pain to left lower abdomen, describes as intermittent ranging from mild to 10/10 pain. Pt is interested in contraception; requests OCPs.  Fleet Contras RN 07/29/20

## 2020-07-29 NOTE — Patient Instructions (Signed)
Primary Care Resources: ° °  [facility]  °[address] [place] [address] Ph [phone] °Every 2nd Saturday 9am-12pm  °[email or web address] °FREE Services ° °  [facility]  °[address] [place] [phone]  °[address] [place] [address] [place] Ph [phone]  °[email or web address] °$45 per visit/Walk-in only ° °  [facility]  °[address] [place] [address] Ph [phone]  °1st & 3rd Saturday of each month 9:30am-12:30pm °[email or web address] °Sliding fee scale/Call to make an appointment ° °  [facility]  °[address] [place] [phone]  °Hours Mon-Fri 9am-7pm & Sat 9am-1pm °[email or web address] °Visits start at $45 per visit/Call to make an appointment ° °  [facility] [place]  °[address] [place] [address] Ph [phone]  °Hours Mon-Wed 8:30am-5pm & Thurs 8:30am-8pm °$5 per visit/Call for an eligibility appointment ° ° ° °

## 2020-07-30 LAB — BETA HCG QUANT (REF LAB): hCG Quant: 5 m[IU]/mL

## 2020-07-31 ENCOUNTER — Telehealth: Payer: Self-pay

## 2020-07-31 NOTE — Telephone Encounter (Addendum)
-----   Message from Judeth Horn, NP sent at 07/30/2020  8:43 AM EST ----- Can you let her know her hcg is negative now and her miscarriage is completed. She speaks [place]. Thanks.   Called pt with interpreter Eda; VM left stating I am calling with results and requesting a call back. Pt returned our call. Called pt back with Eda, results given.

## 2020-09-02 ENCOUNTER — Other Ambulatory Visit: Payer: Self-pay | Admitting: *Deleted

## 2020-09-02 ENCOUNTER — Other Ambulatory Visit: Payer: Self-pay

## 2020-09-02 DIAGNOSIS — Z124 Encounter for screening for malignant neoplasm of cervix: Secondary | ICD-10-CM

## 2020-09-02 NOTE — Progress Notes (Signed)
Patient: Shannon Myers           Date of Birth: 05-04-1986           MRN: [id number] Visit Date: 09/02/2020 PCP: Patient, No Pcp Per (Inactive)  Cervical Cancer Screening Do you smoke?: No Have you ever had or been told you have an allergy to latex products?: No Marital status: Single Date of last pap smear: 2-5 yrs ago (07/13/2016-Negative) Date of last menstrual period: 08/13/20 Number of pregnancies: 6 Number of births: 6 Have you ever had any of the following? Hysterectomy: No Tubal ligation (tubes tied): No Abnormal bleeding: No Abnormal pap smear: No Venereal warts: No A sex partner with venereal warts: No A high risk* sex partner: No  Cervical Exam  Abnormal Observations: Normal exam. Recommendations: Last Pap smear was 07/13/2016 at [facility] and negative. Per patient has no history of an abnormal Pap smear. Last Pap smear result is in Epic. Let patient know if today's Pap smear and HPV typing is normal that her next Pap smear is due in 5 years. Patient informed that will follow-up with her within the next couple of weeks with results of her Pap smear by phone or letter.    Used Spanish interpreter Natale Lay from [facility] provided.  Patient's History Patient Active Problem List   Diagnosis Date Noted  . History of preterm delivery, currently pregnant 07/29/2016  . Obesity 01/19/2013   Past Medical History:  Diagnosis Date  . Anemia   . Preterm labor     Family History  Problem Relation Age of Onset  . Healthy Mother   . Healthy Father   . Alcohol abuse Neg Hx   . Asthma Neg Hx   . Arthritis Neg Hx   . Birth defects Neg Hx   . Cancer Neg Hx   . COPD Neg Hx   . Depression Neg Hx   . Drug abuse Neg Hx   . Early death Neg Hx   . Hearing loss Neg Hx   . Heart disease Neg Hx   . Hyperlipidemia Neg Hx   . Hypertension Neg Hx   . Kidney disease Neg Hx   . Learning disabilities Neg Hx   . Mental illness Neg Hx   . Mental  retardation Neg Hx   . Miscarriages / Stillbirths Neg Hx   . Stroke Neg Hx   . Vision loss Neg Hx     Social History   Occupational History  . Not on file  Tobacco Use  . Smoking status: Never Smoker  . Smokeless tobacco: Never Used  Vaping Use  . Vaping Use: Never used  Substance and Sexual Activity  . Alcohol use: No  . Drug use: No  . Sexual activity: Yes    Birth control/protection: None

## 2020-09-02 NOTE — Addendum Note (Signed)
Addended by: Narda Rutherford on: 09/02/2020 01:58 PM   Modules accepted: Orders

## 2020-09-04 LAB — CYTOLOGY - PAP
Comment: NEGATIVE
Diagnosis: NEGATIVE
High risk HPV: NEGATIVE

## 2020-09-12 ENCOUNTER — Telehealth: Payer: Self-pay

## 2020-09-12 NOTE — Telephone Encounter (Signed)
Called patient via Natale Lay, [employer] to give pap results. Left name and number for patient to call back. Will mail normal result to patient as well.

## 2020-09-17 ENCOUNTER — Telehealth: Payer: Self-pay

## 2020-09-17 LAB — PREGNANCY, URINE: Preg Test, Ur: POSITIVE

## 2020-09-17 NOTE — Telephone Encounter (Signed)
Called patient via Spanish interpreter Natale Lay, [place]. Informed patient of pap smear results (negative, - HPV). Next pap will be due in 5 years. Patient voiced understanding.

## 2020-10-03 ENCOUNTER — Encounter: Payer: Self-pay | Admitting: Obstetrics and Gynecology

## 2020-10-08 ENCOUNTER — Encounter: Payer: Self-pay | Admitting: *Deleted

## 2020-10-08 ENCOUNTER — Telehealth (INDEPENDENT_AMBULATORY_CARE_PROVIDER_SITE_OTHER): Payer: Self-pay | Admitting: *Deleted

## 2020-10-08 DIAGNOSIS — O0990 Supervision of high risk pregnancy, unspecified, unspecified trimester: Secondary | ICD-10-CM | POA: Insufficient documentation

## 2020-10-08 DIAGNOSIS — Z789 Other specified health status: Secondary | ICD-10-CM

## 2020-10-08 DIAGNOSIS — Z8751 Personal history of pre-term labor: Secondary | ICD-10-CM | POA: Insufficient documentation

## 2020-10-08 DIAGNOSIS — O09899 Supervision of other high risk pregnancies, unspecified trimester: Secondary | ICD-10-CM | POA: Insufficient documentation

## 2020-10-08 HISTORY — DX: Supervision of other high risk pregnancies, unspecified trimester: O09.899

## 2020-10-08 NOTE — Progress Notes (Addendum)
10:32 per chart review recent miscarriage in February but new Pregnancy confirmed [facility]. Records not available. Registrar calling for records.  patient has scheduled video visit. Patient not connected . Called patient with Interpreter Lorinda Creed and asked if she is ready to begin video visit . She confirmed she is. Explained I will send a link and we will begin. She voices understanding. Tawana Pasch,RN  10:40 Patient has not connected virtually . I called her with Video Interpreter [phone] and asked if she received the link to join video visit. She states she is at doctor's office with family member who was injured and can't do the visit right not. I asked if she wanted to reschedule for video or in person and she states video. I informed her we may not be able to reschedule before her new ob scheduled 10/14/20 and if we cannot reschedule new ob intake she should keep  her appointment for new ob visit  as scheduled unless registrar calls her with different instructions. She voices understanding. Spoke with registrar and she confirms cannot reschedule for before 10/14/20 and will leave new ob as scheduled.  Jadden Yim,RN  Addendum: records received from [facility], had pregnancy confirmation 09/17/20 and referred to our office due to history PTD. Records abstracted.  Kirk Sampley,RN

## 2020-10-14 ENCOUNTER — Other Ambulatory Visit: Payer: Self-pay

## 2020-10-14 ENCOUNTER — Ambulatory Visit (INDEPENDENT_AMBULATORY_CARE_PROVIDER_SITE_OTHER): Payer: Self-pay | Admitting: Obstetrics & Gynecology

## 2020-10-14 ENCOUNTER — Other Ambulatory Visit (HOSPITAL_COMMUNITY)
Admission: RE | Admit: 2020-10-14 | Discharge: 2020-10-14 | Disposition: A | Discharge status: Home / Self Care | Payer: Self-pay | Source: Ambulatory Visit | Attending: Obstetrics and Gynecology | Admitting: Obstetrics and Gynecology

## 2020-10-14 ENCOUNTER — Encounter: Payer: Self-pay | Admitting: Obstetrics & Gynecology

## 2020-10-14 VITALS — BP 111/64 | HR 78 | Wt 210.8 lb

## 2020-10-14 DIAGNOSIS — O09899 Supervision of other high risk pregnancies, unspecified trimester: Secondary | ICD-10-CM

## 2020-10-14 DIAGNOSIS — O0990 Supervision of high risk pregnancy, unspecified, unspecified trimester: Secondary | ICD-10-CM

## 2020-10-14 LAB — POCT URINALYSIS DIP (DEVICE)
Bilirubin Urine: NEGATIVE
Glucose, UA: NEGATIVE mg/dL
Hgb urine dipstick: NEGATIVE
Ketones, ur: NEGATIVE mg/dL
Leukocytes,Ua: NEGATIVE
Nitrite: NEGATIVE
Protein, ur: NEGATIVE mg/dL
Specific Gravity, Urine: 1.02 (ref 1.005–1.030)
Urobilinogen, UA: 0.2 mg/dL (ref 0.0–1.0)
pH: 7.5 (ref 5.0–8.0)

## 2020-10-14 NOTE — Progress Notes (Signed)
Subjective:    Shannon Myers is a [id number] [redacted]w[redacted]d being seen today for her first obstetrical visit.  Her obstetrical history is significant for advanced maternal age. Patient does intend to breast feed. Pregnancy history fully reviewed.  Patient reports fatigue and nausea.  Vitals:   10/14/20 0957  BP: 111/64  Pulse: 78  Weight: 210 lb 12.8 oz (95.6 kg)    HISTORY: OB History  Gravida Para Term Preterm AB Living  9 7 6 1 1 7   SAB IAB Ectopic Multiple Live Births  1     0 7    # Outcome Date GA Lbr Len/2nd Weight Sex Delivery Anes PTL Lv  9 Current           8 SAB 07/2020          7 Term 01/09/17 [redacted]w[redacted]d    Vag-Spont   LIV  6 Term 11/12/14 [redacted]w[redacted]d  6 lb 7.7 oz (2.94 kg) M Vag-Spont None  LIV     Birth Comments: none- precepitous delivery- delivered in hallway  5 Term 02/02/13 [redacted]w[redacted]d 07:32 / 00:26 6 lb 1.7 oz (2.77 kg) F Vag-Spont None  LIV     Birth Comments: anemia  4 Term 03/11/12 [redacted]w[redacted]d   M Vag-Spont EPI  LIV  3 Term 07/2006 [redacted]w[redacted]d   M Vag-Spont EPI  LIV     Birth Comments: got weekly shots " to prevent early delivery"  2 Preterm 09/2003 [redacted]w[redacted]d   M Vag-Spont EPI Y LIV     Birth Comments: M was in NICU  1 Term 05/2001 [redacted]w[redacted]d  5 lb (2.268 kg)     LIV     Birth Comments: no complications, born in [redacted]w[redacted]d   Past Medical History:  Diagnosis Date  . Anemia   . Preterm labor    Past Surgical History:  Procedure Laterality Date  . NO PAST SURGERIES     Family History  Problem Relation Age of Onset  . Diabetes Mother   . Hypertension Mother   . Healthy Father   . Alcohol abuse Neg Hx   . Asthma Neg Hx   . Arthritis Neg Hx   . Birth defects Neg Hx   . Cancer Neg Hx   . COPD Neg Hx   . Depression Neg Hx   . Drug abuse Neg Hx   . Early death Neg Hx   . Hearing loss Neg Hx   . Heart disease Neg Hx   . Hyperlipidemia Neg Hx   . Kidney disease Neg Hx   . Learning disabilities Neg Hx   . Mental illness Neg Hx   . Mental retardation Neg Hx   . Miscarriages /  Stillbirths Neg Hx   . Stroke Neg Hx   . Vision loss Neg Hx      Exam    Uterus:   8-10 weeks  Pelvic Exam:    Perineum: No Hemorrhoids   Vulva: normal   Vagina:  no palpable nodules   pH:     Cervix: no lesions   Adnexa: normal adnexa   Bony Pelvis: average  System: Breast:  normal appearance, no masses or tenderness   Skin: normal coloration and turgor, no rashes    Neurologic: oriented, normal mood   Extremities: normal strength, tone, and muscle mass   HEENT PERRLA and sclera clear, anicteric   Mouth/Teeth mucous membranes moist, pharynx normal without lesions   Neck supple   Cardiovascular: regular rate and rhythm, no murmurs or gallops  Respiratory:  appears well, vitals normal, no respiratory distress, acyanotic, normal RR, neck free of mass or lymphadenopathy, chest clear, no wheezing, crepitations, rhonchi, normal symmetric air entry   Abdomen: soft, non-tender; bowel sounds normal; no masses,  no organomegaly   Urinary: urethral meatus normal      Assessment:    Pregnancy: [id number] Patient Active Problem List   Diagnosis Date Noted  . Supervision of high risk pregnancy, antepartum 10/08/2020  . History of preterm delivery, currently pregnant 10/08/2020  . Language barrier 10/08/2020  . Obesity 01/19/2013        Plan:     Initial labs drawn. Prenatal vitamins. Problem list reviewed and updated. Genetic Screening discussed  .  Ultrasound discussed; fetal survey: requested.  Follow up in 4 weeks. 50% of 30 min visit spent on counseling and coordination of care.  Ob transvaginal ordered for dating   Scheryl Darter 10/14/2020

## 2020-10-14 NOTE — Patient Instructions (Signed)
Obstetrics: Normal and Problem Pregnancies (7th ed., pp. 102-121). [place], [place]: Elsevier."> Textbook of Family Medicine (9th ed., pp. 365-410). [place], [place]: Elsevier [employer].">  Primer trimestre de embarazo First Trimester of Pregnancy  El primer trimestre de embarazo comienza el primer da de su ltimo periodo menstrual hasta el final de la semana 12. Es decir desde el mes 1 hasta el mes 3 de embarazo. Una semana despus de que un espermatozoide fecunda un vulo, este se implantar en la pared uterina y comenzar a desarrollarse hasta convertirse en un beb. Al final de las 12 semanas, se formarn todos los rganos del beb y el beb tendr un tamao de entre 2 y 3 pulgadas. Durante el primer trimestre, ocurren cambios en el cuerpo Su organismo atraviesa por muchos cambios durante el embarazo. Los cambios varan y generalmente vuelven a la normalidad despus del nacimiento del beb. Cambios fsicos  Usted puede aumentar o bajar de peso.  Las mamas pueden empezar a agrandarse y estar sensibles. El tejido que rodea los pezones (arola) puede tornarse ms oscuro.  Pueden aparecer zonas oscuras o manchas (cloasma o mscara del embarazo) en el rostro.  Tal vez haya cambios en el cabello. Estos pueden incluir engrosamiento, afinamiento y cambios en la textura. Cambios en la salud  Quizs sienta nuseas y es posible que vomite.  Es posible que tenga acidez estomacal.  Comienza a tener dolores de cabeza.  Puede tener estreimiento.  Las encas pueden sangrar y estar sensibles al cepillado y al hilo dental. Otros cambios  Puede cansarse con facilidad.  Puede orinar con mayor frecuencia.  Los perodos menstruales se interrumpirn.  Tal vez no tenga apetito.  Puede sentir un fuerte deseo de consumir ciertos alimentos.  Puede tener cambios en sus emociones de un da para otro.  Tendr sueos ms vvidos y extraos. Siga estas instrucciones en su casa: Medicamentos  Siga  las instrucciones del mdico en relacin con el uso de medicamentos. Durante el embarazo, hay medicamentos que pueden tomarse y otros que no. No use ningn medicamento a menos que se los haya indicado el mdico.  Tome vitaminas prenatales que contengan por lo menos 600microgramos (mcg) de cido flico. Comida y bebida  Lleve una dieta saludable que incluya frutas y verduras frescas, cereales integrales, buenas fuentes de protenas como carnes magras, huevos o tofu, y productos lcteos descremados.  Evite la carne cruda y el jugo, la leche y el queso sin pasteurizar. Estos portan grmenes que pueden provocar dao tanto a usted como al beb.  Siente nuseas o vomita: ? Ingiera 4 o 5comidas pequeas por da en lugar de 3abundantes. ? Intente comer algunas galletitas saladas. ? Beba lquidos entre las comidas, en lugar de hacerlo durante estas.  Es posible que tenga que tomar estas medidas para prevenir o tratar el estreimiento: ? Beber suficiente lquido como para mantener la orina de color amarillo plido. ? Consumir alimentos ricos en fibra, como frijoles, cereales integrales, y frutas y verduras frescas. ? Limitar el consumo de alimentos ricos en grasa y azcares procesados, como los alimentos fritos o dulces. Actividad  Haga ejercicio solamente como se lo haya indicado el mdico. La mayora de las personas pueden continuar su rutina de ejercicios durante el embarazo. Intente realizar como mnimo 30minutos de actividad fsica por lo menos 5das a la semana.  Deje de hacer ejercicio si le aparecen dolor o clicos en la parte baja del vientre o de la espalda.  Evite hacer ejercicio si hace mucho calor o humedad, o   si se encuentra a una altitud elevada.  Evite levantar pesos excesivos.  Si lo desea, puede seguir teniendo relaciones sexuales, salvo que el mdico le indique lo contrario. Alivio del dolor y del malestar  Use un sostn que le brinde buen soporte para aliviar el dolor de  mamas.  Descanse con las piernas elevadas si tiene calambres o dolor de cintura.  Si desarrolla venas abultadas (vrices) en las piernas: ? Use medias de compresin como se lo haya indicado el mdico. ? Eleve los pies durante 15minutos, 3 o 4veces por da. ? Limite el consumo de sal en su dieta. Seguridad  Use el cinturn de seguridad en todo momento mientras conduce o va en auto.  Hable con el mdico si es vctima de maltrato verbal o fsico.  Hable con el mdico si se siente triste o tiene pensamientos acerca de hacerse dao a usted misma. Estilo de vida  No se d baos de inmersin en agua caliente, baos turcos ni saunas.  No se haga duchas vaginales. No use tampones ni toallas higinicas perfumadas.  No use remedios a base de hierbas, alcohol, drogas ilegales ni medicamentos que no estn aprobados por el mdico. Las sustancias qumicas de estos productos pueden daar al beb.  No consuma ningn producto que contenga nicotina o tabaco, como cigarrillos, cigarrillos electrnicos y tabaco de mascar. Si necesita ayuda para dejar de fumar, consulte al mdico.  Evite el contacto con las bandejas sanitarias de los gatos y la tierra que estos animales usan. Estos elementos contienen bacterias que pueden causar defectos congnitos al beb y la posible prdida del beb en gestacin (feto) debido a un aborto espontneo o muerte fetal. Instrucciones generales  Durante las visitas prenatales de rutina del primer trimestre, el mdico le har un examen fsico, realizar las pruebas necesarias y le preguntar cmo van las cosas. Cumpla con todas las visitas de seguimiento. Esto es importante.  Pida ayuda si tiene necesidades nutricionales o de asesoramiento durante el embarazo. El mdico puede aconsejarla o derivarla a especialistas para que la ayuden con diferentes necesidades.  Programe una cita con el dentista. En su casa, lvese los dientes con un cepillo dental suave. Psese el hilo  dental suavemente.  Escriba sus preguntas. Llvelas cuando concurra a las visitas prenatales. Dnde buscar ms informacin  American Pregnancy Association (Asociacin Americana del Embarazo): americanpregnancy.org  American College of Obstetricians and Gynecologists (Colegio Estadounidense de Obstetras y Gineclogos): acog.org/womens-health/pregnancy?  Office on Women's Health (Oficina para la Salud de la Mujer): womenshealth.gov/pregnancy Comunquese con un mdico si tiene:  Mareos.  Fiebre.  Clicos leves, presin en la pelvis o dolor persistente en el abdomen.  Nuseas, vmitos o diarrea que duran 24 horas o ms.  Una secrecin vaginal con mal olor.  Dolor al orinar.  Una enfermedad contagiosa, como varicela, sarampin, virus de Zika, VIH o hepatitis. Solicite ayuda inmediatamente si:  Tiene manchado o sangrado de la vagina.  Tiene dolor intenso o clicos en el abdomen.  Siente que le falta el Summar o dolor en el pecho.  Sufre cualquier tipo de traumatismo, por ejemplo, debido a una cada o un accidente automovilstico.  Dolor, hinchazn o enrojecimiento nuevos en un brazo o una pierna, o un aumento de alguno de estos sntomas. Resumen  El primer trimestre del embarazo comienza el primer da de su ltimo periodo menstrual hasta el final de la semana 12 (meses 1 al 3).  Hacer 4 o 5 comidas pequeas al da en lugar de 3 comidas grandes tambin   puede aliviar las nuseas y los vmitos.  No consuma ningn producto que contenga nicotina o tabaco, como cigarrillos, cigarrillos electrnicos y tabaco de mascar. Si necesita ayuda para dejar de fumar, consulte al mdico.  Cumpla con todas las visitas de seguimiento. Esto es importante. Esta informacin no tiene como fin reemplazar el consejo del mdico. Asegrese de hacerle al mdico cualquier pregunta que tenga. Document Revised: 11/20/2019 Document Reviewed: 11/20/2019 Elsevier Patient Education  2021 Elsevier Inc.  

## 2020-10-15 LAB — HEMOGLOBIN A1C
Est. average glucose Bld gHb Est-mCnc: 105 mg/dL
Hgb A1c MFr Bld: 5.3 % (ref 4.8–5.6)

## 2020-10-15 LAB — CERVICOVAGINAL ANCILLARY ONLY
Bacterial Vaginitis (gardnerella): NEGATIVE
Candida Glabrata: NEGATIVE
Candida Vaginitis: NEGATIVE
Chlamydia: NEGATIVE
Comment: NEGATIVE
Comment: NEGATIVE
Comment: NEGATIVE
Comment: NEGATIVE
Comment: NEGATIVE
Comment: NORMAL
Neisseria Gonorrhea: NEGATIVE
Trichomonas: NEGATIVE

## 2020-10-15 LAB — ABO/RH: Rh Factor: POSITIVE

## 2020-10-15 LAB — CBC
Hematocrit: 41.3 % (ref 34.0–46.6)
Hemoglobin: 14 g/dL (ref 11.1–15.9)
MCH: 29.8 pg (ref 26.6–33.0)
MCHC: 33.9 g/dL (ref 31.5–35.7)
MCV: 88 fL (ref 79–97)
Platelets: 234 10*3/uL (ref 150–450)
RBC: 4.7 x10E6/uL (ref 3.77–5.28)
RDW: 13.3 % (ref 11.7–15.4)
WBC: 9.4 10*3/uL (ref 3.4–10.8)

## 2020-10-15 LAB — HEPATITIS C ANTIBODY: Hep C Virus Ab: <0.1 s/co ratio (ref 0.0–0.9)

## 2020-10-15 LAB — HIV ANTIBODY (ROUTINE TESTING W REFLEX): HIV Screen 4th Generation wRfx: NONREACTIVE

## 2020-10-15 LAB — RPR: RPR Ser Ql: NONREACTIVE

## 2020-10-15 LAB — RUBELLA SCREEN: Rubella Antibodies, IGG: 1.55 index (ref >0.99)

## 2020-10-15 LAB — ANTIBODY SCREEN: Antibody Screen: NEGATIVE

## 2020-10-15 LAB — HEPATITIS B SURFACE ANTIGEN: Hepatitis B Surface Ag: NEGATIVE

## 2020-10-16 LAB — CULTURE, OB URINE

## 2020-10-16 LAB — URINE CULTURE, OB REFLEX

## 2020-10-17 ENCOUNTER — Ambulatory Visit: Payer: Self-pay

## 2020-10-17 ENCOUNTER — Other Ambulatory Visit: Payer: Self-pay

## 2020-11-11 ENCOUNTER — Ambulatory Visit (INDEPENDENT_AMBULATORY_CARE_PROVIDER_SITE_OTHER): Payer: Self-pay | Admitting: Obstetrics and Gynecology

## 2020-11-11 ENCOUNTER — Telehealth: Payer: Self-pay

## 2020-11-11 ENCOUNTER — Other Ambulatory Visit: Payer: Self-pay

## 2020-11-11 VITALS — BP 113/68 | HR 86 | Wt 206.0 lb

## 2020-11-11 DIAGNOSIS — Z789 Other specified health status: Secondary | ICD-10-CM

## 2020-11-11 DIAGNOSIS — O09899 Supervision of other high risk pregnancies, unspecified trimester: Secondary | ICD-10-CM

## 2020-11-11 DIAGNOSIS — O0990 Supervision of high risk pregnancy, unspecified, unspecified trimester: Secondary | ICD-10-CM

## 2020-11-11 DIAGNOSIS — Z3A14 14 weeks gestation of pregnancy: Secondary | ICD-10-CM | POA: Insufficient documentation

## 2020-11-11 NOTE — Progress Notes (Signed)
   PRENATAL VISIT NOTE  Subjective:  Shannon Myers is a 35 y.o. [phone] at [redacted]w[redacted]d being seen today for ongoing prenatal care.  She is currently monitored for the following issues for this high-risk pregnancy and has Obesity; Supervision of high risk pregnancy, antepartum; History of preterm delivery, currently pregnant; Language barrier; and [redacted] weeks gestation of pregnancy on their problem list.  Patient doing well with no acute concerns today. She reports no complaints.  Contractions: Not present. Vag. Bleeding: None.  Movement: Absent. Denies leaking of fluid.   Chart reviewed.  She has ahd a 28 week delivery due to PTL, per pt the rest of her pregnancies, she received progesterone injections and delivered at term  The following portions of the patient's history were reviewed and updated as appropriate: allergies, current medications, past family history, past medical history, past social history, past surgical history and problem list. Problem list updated.  Objective:   Vitals:   11/11/20 0945  BP: 113/68  Pulse: 86  Weight: 206 lb (93.4 kg)    Fetal Status: Fetal Heart Rate (bpm): [place]   Movement: Absent     General:  Alert, oriented and cooperative. Patient is in no acute distress.  Skin: Skin is warm and dry. No rash noted.   Cardiovascular: Normal heart rate noted  Respiratory: Normal respiratory effort, no problems with respiration noted  Abdomen: Soft, gravid, appropriate for gestational age.  Pain/Pressure: Present     Pelvic: Cervical exam deferred        Extremities: Normal range of motion.  Edema: None  Mental Status:  Normal mood and affect. Normal behavior. Normal judgment and thought content.   Assessment and Plan:  Pregnancy: [id number] at [redacted]w[redacted]d  1. [redacted] weeks gestation of pregnancy   2. Supervision of high risk pregnancy, antepartum Routine care, AFP next visit  3. History of preterm delivery, currently pregnant Will begin paperwork for 17 [place]-  progesterone  4. Language barrier interpreter  Preterm labor symptoms and general obstetric precautions including but not limited to vaginal bleeding, contractions, leaking of fluid and fetal movement were reviewed in detail with the patient.  Please refer to After Visit Summary for other counseling recommendations.   Return in about 4 weeks (around 12/09/2020).   Mariel Aloe, MD Faculty Attending Center for [facility]

## 2020-11-11 NOTE — Telephone Encounter (Signed)
Alled Pt using [employer] interpreter Heidi id# [phone]. Called Pt to ask if she wanted to administer the [facility] or did she want to get it done at the Dr's office, no answer, left VM.Will still order thru [facility]akena to come to office.

## 2020-11-19 ENCOUNTER — Telehealth: Payer: Self-pay | Admitting: Family Medicine

## 2020-11-19 NOTE — Telephone Encounter (Signed)
Returned call to [place] at [place]. Tobi Bastos states [facility] Order Form was not filled out completely. Needs this resent. Also, has been unable to reach patient to verify no insurance. Tobi Bastos will contact our office at the end of the week if she has not heard from the patient.   Called pt with [facility] interpreter Graham ID [phone]. VM left stating I am calling to follow up on needed medication. Requested pt call [facility], phone number given. Called pt a second time; call did not ring, straight to VM.

## 2020-11-19 NOTE — Telephone Encounter (Signed)
Tobi Bastos with [facility] care connection called office requesting to speak with someone regarding a rx request for a auto injector.

## 2020-11-26 ENCOUNTER — Ambulatory Visit (INDEPENDENT_AMBULATORY_CARE_PROVIDER_SITE_OTHER): Payer: Self-pay

## 2020-11-26 ENCOUNTER — Other Ambulatory Visit: Payer: Self-pay

## 2020-11-26 DIAGNOSIS — O09899 Supervision of other high risk pregnancies, unspecified trimester: Secondary | ICD-10-CM

## 2020-11-26 NOTE — Progress Notes (Signed)
Pt here today for Makena Injection. Makena Injection has not been delivered to our office and the patient does not have at home. Called Cruzita Lederer [phone] to verify insurance and issue with [facility] form.  Have requested call back from case manager Tobi Bastos to verify form information.  Will call pt back once have spoken to [place].   Judeth Cornfield, RN

## 2020-11-27 NOTE — Progress Notes (Signed)
Spoke with Ardath Sax [facility] Rep. Placed Makena Injection Rx  as urgent for expedited delivery. Will call office in next couple days from [facility] for delivery of med.   Judeth Cornfield, RN

## 2020-11-29 ENCOUNTER — Telehealth: Payer: Self-pay | Admitting: *Deleted

## 2020-11-29 NOTE — Telephone Encounter (Signed)
Received a voicemail from [place] at [place] stating calling to notify us patient is not approved for patient assistance program because it shows she has [place] / [employer].  States they called insurance and they verified she has insurance.  States they sent the prescription to [facility] and they will contact our office and follow up with [place]. States may require prior auth and that was faxed to our office.  States she will follow up with the patient. Marleah Beever,RN

## 2020-12-04 ENCOUNTER — Inpatient Hospital Stay (HOSPITAL_COMMUNITY)
Admission: AD | Admit: 2020-12-04 | Discharge: 2020-12-04 | Disposition: A | Discharge status: Home / Self Care | Payer: Self-pay | Attending: Obstetrics and Gynecology | Admitting: Obstetrics and Gynecology

## 2020-12-04 ENCOUNTER — Other Ambulatory Visit: Payer: Self-pay

## 2020-12-04 ENCOUNTER — Encounter (HOSPITAL_COMMUNITY): Payer: Self-pay | Admitting: Obstetrics and Gynecology

## 2020-12-04 DIAGNOSIS — O26892 Other specified pregnancy related conditions, second trimester: Secondary | ICD-10-CM

## 2020-12-04 DIAGNOSIS — N9489 Other specified conditions associated with female genital organs and menstrual cycle: Secondary | ICD-10-CM

## 2020-12-04 DIAGNOSIS — O26891 Other specified pregnancy related conditions, first trimester: Secondary | ICD-10-CM | POA: Insufficient documentation

## 2020-12-04 DIAGNOSIS — Z789 Other specified health status: Secondary | ICD-10-CM

## 2020-12-04 DIAGNOSIS — O09522 Supervision of elderly multigravida, second trimester: Secondary | ICD-10-CM | POA: Insufficient documentation

## 2020-12-04 DIAGNOSIS — O09212 Supervision of pregnancy with history of pre-term labor, second trimester: Secondary | ICD-10-CM | POA: Insufficient documentation

## 2020-12-04 DIAGNOSIS — O9229 Other disorders of breast associated with pregnancy and the puerperium: Secondary | ICD-10-CM

## 2020-12-04 DIAGNOSIS — R102 Pelvic and perineal pain: Secondary | ICD-10-CM | POA: Insufficient documentation

## 2020-12-04 DIAGNOSIS — R109 Unspecified abdominal pain: Secondary | ICD-10-CM | POA: Insufficient documentation

## 2020-12-04 DIAGNOSIS — O0990 Supervision of high risk pregnancy, unspecified, unspecified trimester: Secondary | ICD-10-CM

## 2020-12-04 DIAGNOSIS — R42 Dizziness and giddiness: Secondary | ICD-10-CM | POA: Insufficient documentation

## 2020-12-04 DIAGNOSIS — N949 Unspecified condition associated with female genital organs and menstrual cycle: Secondary | ICD-10-CM

## 2020-12-04 DIAGNOSIS — O0942 Supervision of pregnancy with grand multiparity, second trimester: Secondary | ICD-10-CM | POA: Insufficient documentation

## 2020-12-04 DIAGNOSIS — O09899 Supervision of other high risk pregnancies, unspecified trimester: Secondary | ICD-10-CM

## 2020-12-04 DIAGNOSIS — Z3A17 17 weeks gestation of pregnancy: Secondary | ICD-10-CM | POA: Insufficient documentation

## 2020-12-04 LAB — WET PREP, GENITAL
Clue Cells Wet Prep HPF POC: NONE SEEN
Sperm: NONE SEEN
Trich, Wet Prep: NONE SEEN
Yeast Wet Prep HPF POC: NONE SEEN

## 2020-12-04 LAB — URINALYSIS, COMPLETE (UACMP) WITH MICROSCOPIC
Bacteria, UA: NONE SEEN
Bilirubin Urine: NEGATIVE
Glucose, UA: NEGATIVE mg/dL
Ketones, ur: 20 mg/dL — AB
Leukocytes,Ua: NEGATIVE
Nitrite: NEGATIVE
Protein, ur: NEGATIVE mg/dL
Specific Gravity, Urine: 1.011 (ref 1.005–1.030)
pH: 7 (ref 5.0–8.0)

## 2020-12-04 MED ORDER — FERROUS SULFATE 325 (65 FE) MG PO TABS: 325.0000 mg | ORAL_TABLET | Freq: Every day | ORAL | 0 refills | Status: DC

## 2020-12-04 NOTE — MAU Note (Signed)
Having some contractions, started on Friday, "every 5 min". Breasts seem very hot and painful.  Has been dizzy.  Eyes are irritated, burning.

## 2020-12-04 NOTE — Telephone Encounter (Signed)
Shannon Myers from [place] called today stating they are still waiting to hear back from [facility] to see if patient has insurance benefits for this or not OR if she has insurance. Shannon Myers states they will go ahead and send a courtesy injector so patient can begin medication.

## 2020-12-04 NOTE — MAU Provider Note (Signed)
Patient Shannon Myers is a 35 y.o. [id number]  At [redacted]w[redacted]d here with complaints of breast tenderness and contractions. She denies vaginal bleeding, vaginal discharge, dysuria, NV, constipation, diarrhea. She denies cough, fever. She reports that she has had contractions and dizziness since Friday ( 7 days) and breast pain since Monday.  She receives her care at [facility]. She has a history pre-e of in 2018 and 2016; she denies history of DM. No c/sections in past. Had preterm birth in 2002 and 2005; has not started [facility] but [facility] is working on getting her injections.  History     CSN: [id number]  Arrival date and time: 12/04/20 1328   Event Date/Time   First Provider Initiated Contact with Patient 12/04/20 1514      Chief Complaint  Patient presents with   Contractions   Dizziness   Breast Pain   Dizziness This is a new problem. The current episode started in the past 7 days. Episode frequency: 7 times a day. Associated symptoms include abdominal pain. Pertinent negatives include no congestion, coughing, fever, headaches, vomiting or weakness. The symptoms are aggravated by standing. She has tried lying down for the symptoms. The treatment provided mild relief.  Abdominal Pain This is a recurrent problem. The problem occurs 2 to 4 times per day. The most recent episode lasted 2 hours. The pain is at a severity of 8/10. The abdominal pain radiates to the suprapubic region. Pertinent negatives include no fever, headaches or vomiting.  She reports that she has tried using the bathroom but it does not help. Her last BM was 1 day ago.   She reports feeling like her breasts hurt in the morning. This started two days ago. They feel swollen and hot. It gets better during the day. She has this in both breasts, near sternum.   OB History     Gravida  9   Para  7   Term  6   Preterm  1   AB  1   Living  7      SAB  1   IAB      Ectopic      Multiple  0   Live Births  7            Past Medical History:  Diagnosis Date   Anemia    Preterm labor     Past Surgical History:  Procedure Laterality Date   NO PAST SURGERIES      Family History  Problem Relation Age of Onset   Diabetes Mother    Hypertension Mother    Healthy Father    Alcohol abuse Neg Hx    Asthma Neg Hx    Arthritis Neg Hx    Birth defects Neg Hx    Cancer Neg Hx    COPD Neg Hx    Depression Neg Hx    Drug abuse Neg Hx    Early death Neg Hx    Hearing loss Neg Hx    Heart disease Neg Hx    Hyperlipidemia Neg Hx    Kidney disease Neg Hx    Learning disabilities Neg Hx    Mental illness Neg Hx    Mental retardation Neg Hx    Miscarriages / Stillbirths Neg Hx    Stroke Neg Hx    Vision loss Neg Hx     Social History   Tobacco Use   Smoking status: Never   Smokeless tobacco: Never  Vaping Use   Vaping  Use: Never used  Substance Use Topics   Alcohol use: No   Drug use: No    Allergies: No Known Allergies  Medications Prior to Admission  Medication Sig Dispense Refill Last Dose   ibuprofen (ADVIL) 600 MG tablet Take 1 tablet (600 mg total) by mouth every 6 (six) hours as needed for moderate pain. (Patient not taking: Reported on 10/14/2020) 60 tablet 0    norethindrone-ethinyl estradiol (LOESTRIN FE) 1-20 MG-MCG tablet Take 1 tablet by mouth daily. (Patient not taking: Reported on 10/14/2020) 30 tablet 11    Prenatal Vit-Fe Fumarate-FA (MULTIVITAMIN-PRENATAL) 27-0.8 MG TABS tablet Take 1 tablet by mouth daily at 12 noon.      promethazine (PHENERGAN) 12.5 MG tablet Take 1 tablet (12.5 mg total) by mouth every 6 (six) hours as needed for nausea or vomiting. (Patient not taking: Reported on 10/14/2020) 30 tablet 0     Review of Systems  Constitutional: Negative.  Negative for fever.  HENT: Negative.  Negative for congestion.   Respiratory: Negative.  Negative for cough.   Cardiovascular: Negative.   Gastrointestinal:  Positive for abdominal pain. Negative for vomiting.   Genitourinary: Negative.   Neurological:  Positive for dizziness. Negative for weakness and headaches.  Physical Exam   Blood pressure 127/67, pulse 82, resp. rate 18, weight 94.5 kg, last menstrual period 08/04/2020, SpO2 100 %, unknown if currently breastfeeding.  Physical Exam Constitutional:      Appearance: Normal appearance.  HENT:     Head: Normocephalic.  Cardiovascular:     Pulses: Normal pulses.  Pulmonary:     Effort: Pulmonary effort is normal.  Abdominal:     General: Abdomen is flat.     Palpations: Abdomen is soft.  Skin:    General: Skin is warm and dry.     Comments: Skin on chest is warm, dry, no redness, bruising. Breasts are soft, slightly tender, no abscesses.   Neurological:     General: No focal deficit present.     Mental Status: She is alert.  -no CVA tenderness, abdomen is soft.  MAU Course  Procedures  MDM -will do cervical exam, although sounds like patient is having normal aches and pains of pregnancy. Given history of preterm labor, will check cervix. Cervix checked at 1645; long, 1 cm, posterior -will do swabs to check for STI/infection -FHR is 145 by Doppler -CBC not done  -UA shows blood in urine, but no other signs of infection.   Assessment and Plan   1. Supervision of high risk pregnancy, antepartum   2. History of preterm delivery, currently pregnant   3. Language barrier   4. Round ligament pain   -UC pending, GC pending -Keep appt on the 12/09/2020 at [facility] -will start patient on iron, recommend that she take every other day, take with food.  -reassurance that aches and pains are normal in pregnancy, and that breast pain is likely due to hormonal changes, reassured patient that she has no infection in her breasts.  -explained that we will call her if she needs an antibiotic for UTI or STI -all questions answered ; patient expressed appreciation for the care Charlesetta Garibaldi Bernerd Terhune 12/04/2020, 3:24 PM

## 2020-12-05 LAB — GC/CHLAMYDIA PROBE AMP (~~LOC~~) NOT AT ARMC
Chlamydia: NEGATIVE
Comment: NEGATIVE
Comment: NORMAL
Neisseria Gonorrhea: NEGATIVE

## 2020-12-05 NOTE — Telephone Encounter (Signed)
[facility] left VM requesting a callback to set up delivery to our office of Shannon Myers. Confirmed information; Shannon Myers to be delivered Monday, this is the earliest possible delivery.

## 2020-12-09 ENCOUNTER — Ambulatory Visit (INDEPENDENT_AMBULATORY_CARE_PROVIDER_SITE_OTHER): Payer: Self-pay | Admitting: Obstetrics and Gynecology

## 2020-12-09 ENCOUNTER — Other Ambulatory Visit: Payer: Self-pay

## 2020-12-09 VITALS — BP 115/77 | HR 82 | Wt 209.0 lb

## 2020-12-09 DIAGNOSIS — O99891 Other specified diseases and conditions complicating pregnancy: Secondary | ICD-10-CM

## 2020-12-09 DIAGNOSIS — Z789 Other specified health status: Secondary | ICD-10-CM

## 2020-12-09 DIAGNOSIS — O0990 Supervision of high risk pregnancy, unspecified, unspecified trimester: Secondary | ICD-10-CM

## 2020-12-09 DIAGNOSIS — O09899 Supervision of other high risk pregnancies, unspecified trimester: Secondary | ICD-10-CM

## 2020-12-09 DIAGNOSIS — M549 Dorsalgia, unspecified: Secondary | ICD-10-CM | POA: Insufficient documentation

## 2020-12-09 DIAGNOSIS — Z3A18 18 weeks gestation of pregnancy: Secondary | ICD-10-CM

## 2020-12-09 DIAGNOSIS — R319 Hematuria, unspecified: Secondary | ICD-10-CM | POA: Insufficient documentation

## 2020-12-09 NOTE — Progress Notes (Signed)
Patient complains of pelvic pain/pressure (left side) that has been happening for a week along with contractions. Patient stated that she noticed blood in her urine, lower back pain and has been having a constant urge to urinate but stated "nothing comes out"   Jamelyn Bovard, CMA

## 2020-12-09 NOTE — Progress Notes (Signed)
   PRENATAL VISIT NOTE  Subjective:  Shannon Myers is a 35 y.o. [phone] at [redacted]w[redacted]d being seen today for ongoing prenatal care.  She is currently monitored for the following issues for this high-risk pregnancy and has Obesity; [redacted] weeks gestation of pregnancy; Supervision of high risk pregnancy, antepartum; History of preterm delivery, currently pregnant; Language barrier; [redacted] weeks gestation of pregnancy; Hematuria; and Back pain during pregnancy on their problem list.  Patient doing well with no acute concerns today. She reports backache.  Contractions: Irritability. Vag. Bleeding: Small.  Movement: Present. Denies leaking of fluid.   The following portions of the patient's history were reviewed and updated as appropriate: allergies, current medications, past family history, past medical history, past social history, past surgical history and problem list. Problem list updated.  Objective:   Vitals:   12/09/20 0831  BP: 115/77  Pulse: 82  Weight: 209 lb (94.8 kg)    Fetal Status: Fetal Heart Rate (bpm): 142   Movement: Present     General:  Alert, oriented and cooperative. Patient is in no acute distress.  Skin: Skin is warm and dry. No rash noted.   Cardiovascular: Normal heart rate noted  Respiratory: Normal respiratory effort, no problems with respiration noted  Abdomen: Soft, gravid, appropriate for gestational age.  Pain/Pressure: Present     Pelvic: Cervical exam deferred        Extremities: Normal range of motion.  Edema: Trace  Mental Status:  Normal mood and affect. Normal behavior. Normal judgment and thought content.   Assessment and Plan:  Pregnancy: [id number] at [redacted]w[redacted]d  1. [redacted] weeks gestation of pregnancy   2. Supervision of high risk pregnancy, antepartum Continue routine care - AFP, Serum, Open Spina Bifida  3. Language barrier Interpreter present  4. History of preterm delivery, currently pregnant Staff is working on G[employer]er.  One delivery at 28  weeks, all other delveries at term  5. Hematuria, unspecified type Questionable UTI, urine culture not done previously, will check today, no CVA tenderness - Urine Culture  6. Back pain during pregnancy Advised tylenol rest and warm heat  Preterm labor symptoms and general obstetric precautions including but not limited to vaginal bleeding, contractions, leaking of fluid and fetal movement were reviewed in detail with the patient.  Please refer to After Visit Summary for other counseling recommendations.   Return in about 4 weeks (around 01/06/2021) for [facility], in person.   Mariel Aloe, MD Faculty Attending Center for [facility]

## 2020-12-10 ENCOUNTER — Telehealth: Payer: Self-pay | Admitting: General Practice

## 2020-12-10 ENCOUNTER — Telehealth: Payer: Self-pay

## 2020-12-10 LAB — URINE CULTURE

## 2020-12-10 NOTE — Telephone Encounter (Signed)
Single Makena auto-injector arrived to office. Called pt to notify with interpreter Raquel. Pt to come for nurse visit tomorrow AM at 0940.

## 2020-12-10 NOTE — Telephone Encounter (Signed)
[facility] called and left message on nurse voicemail line stating the patient's prescription benefits do cover Makena but it requires a prior authorization. They state we can fill out paperwork and fax back or give them a call to complete a verbal prior authorization at [id number] and reference prescription ID #[id number]

## 2020-12-11 ENCOUNTER — Other Ambulatory Visit: Payer: Self-pay

## 2020-12-11 ENCOUNTER — Ambulatory Visit (INDEPENDENT_AMBULATORY_CARE_PROVIDER_SITE_OTHER): Payer: Self-pay

## 2020-12-11 VITALS — BP 112/62 | HR 84 | Wt 208.1 lb

## 2020-12-11 DIAGNOSIS — O0990 Supervision of high risk pregnancy, unspecified, unspecified trimester: Secondary | ICD-10-CM

## 2020-12-11 DIAGNOSIS — O09899 Supervision of other high risk pregnancies, unspecified trimester: Secondary | ICD-10-CM

## 2020-12-11 MED ORDER — HYDROXYPROGESTERONE CAPROATE 275 MG/1.1ML ~~LOC~~ SOAJ
275.0000 mg | Freq: Once | SUBCUTANEOUS | Status: AC
  Administered 2020-12-11: 275 mg via SUBCUTANEOUS

## 2020-12-11 NOTE — Progress Notes (Signed)
Shannon Myers here for 17-P Injection. Injection administered without complication. Patient will return in one week for next injection.   Patient reports increased pelvic pressure for the past few days. Reports contraction every 3 hours since prior to appt on 12/09/20. Denies vaginal bleeding or abnormal discharge. Reviewed with Edd Arbour, CNM who recommends pt go to MAU for evaluation due to symptoms and prior history of preterm delivery at 28 weeks. Report called to Quinn, CNM.   Pt scheduled for return appointment next Wednesday for 17-P injection.  Marjo Bicker, RN 12/11/2020  9:50 AM

## 2020-12-11 NOTE — Telephone Encounter (Signed)
Called the listed number and was advised by Arnela that we will receive a fax with approval or denial within 24 hours to number provided.    Leonette Nutting  12/11/20

## 2020-12-13 NOTE — Progress Notes (Signed)
Patient was assessed and managed by nursing staff during this encounter. I have reviewed the chart and agree with the documentation and plan.   Edd Arbour, MSN, CNM, [facility] 12/13/20 12:04 PM

## 2020-12-16 ENCOUNTER — Ambulatory Visit (HOSPITAL_BASED_OUTPATIENT_CLINIC_OR_DEPARTMENT_OTHER): Payer: Self-pay

## 2020-12-16 ENCOUNTER — Ambulatory Visit: Payer: Self-pay | Attending: Obstetrics & Gynecology | Admitting: *Deleted

## 2020-12-16 ENCOUNTER — Telehealth: Payer: Self-pay

## 2020-12-16 ENCOUNTER — Ambulatory Visit: Payer: Self-pay

## 2020-12-16 ENCOUNTER — Other Ambulatory Visit: Payer: Self-pay | Admitting: *Deleted

## 2020-12-16 ENCOUNTER — Other Ambulatory Visit: Payer: Self-pay

## 2020-12-16 ENCOUNTER — Encounter: Payer: Self-pay | Admitting: *Deleted

## 2020-12-16 VITALS — BP 105/58 | HR 77

## 2020-12-16 DIAGNOSIS — O99212 Obesity complicating pregnancy, second trimester: Secondary | ICD-10-CM | POA: Insufficient documentation

## 2020-12-16 DIAGNOSIS — Z789 Other specified health status: Secondary | ICD-10-CM

## 2020-12-16 DIAGNOSIS — O09899 Supervision of other high risk pregnancies, unspecified trimester: Secondary | ICD-10-CM

## 2020-12-16 DIAGNOSIS — O09212 Supervision of pregnancy with history of pre-term labor, second trimester: Secondary | ICD-10-CM | POA: Insufficient documentation

## 2020-12-16 DIAGNOSIS — O09522 Supervision of elderly multigravida, second trimester: Secondary | ICD-10-CM | POA: Insufficient documentation

## 2020-12-16 DIAGNOSIS — O09529 Supervision of elderly multigravida, unspecified trimester: Secondary | ICD-10-CM

## 2020-12-16 DIAGNOSIS — O0990 Supervision of high risk pregnancy, unspecified, unspecified trimester: Secondary | ICD-10-CM

## 2020-12-16 DIAGNOSIS — Z3A19 19 weeks gestation of pregnancy: Secondary | ICD-10-CM | POA: Insufficient documentation

## 2020-12-16 DIAGNOSIS — Z363 Encounter for antenatal screening for malformations: Secondary | ICD-10-CM | POA: Insufficient documentation

## 2020-12-16 DIAGNOSIS — O0992 Supervision of high risk pregnancy, unspecified, second trimester: Secondary | ICD-10-CM | POA: Insufficient documentation

## 2020-12-16 NOTE — Telephone Encounter (Signed)
VM left on nurse line with request for weight on 12/09/20 for AFP calculation purposes. Returned phone call; information given.

## 2020-12-17 ENCOUNTER — Telehealth: Payer: Self-pay

## 2020-12-17 LAB — AFP, SERUM, OPEN SPINA BIFIDA
AFP MoM: 0.76
AFP Value: 28.4 ng/mL
Gest. Age on Collection Date: 18 weeks
Maternal Age At EDD: 35.5 yr
OSBR Risk 1 IN: 10000
Test Results:: NEGATIVE
Weight: 209 [lb_av]

## 2020-12-17 NOTE — Telephone Encounter (Signed)
Call received this AM from [place] at [facility]; phone number: [phone], ext [phone]. She states Tobi Bastos is out of the office but asked her to follow up on status of Makena. Requested I call [employer] to complete PA. States she will rush ship a second Makena auto injector that should arrive today-Thursday at our office.  Called [employer] at [phone]. Samara Deist PA representative answers and states there is an approved PA on file. This was approved 12/11/20; ID number is [id number]. Called Loraina to notify her, she states she will contact specialty pharmacy to inquire about cause for delayed shipment because our office has not received Makena auto injector shipment.   Called pt with interpreter Raquel to move appt tomorrow morning to latest appt time possible to increase chances of receiving Makena injector prior to appt.

## 2020-12-18 ENCOUNTER — Telehealth: Payer: Self-pay | Admitting: General Practice

## 2020-12-18 ENCOUNTER — Ambulatory Visit: Payer: Self-pay

## 2020-12-18 NOTE — Telephone Encounter (Signed)
Called [place]aina at [facility] for update on status of prescription; she requests I call specialty pharmacy to confirm delivery address. Please call specialty pharmacy at [phone].

## 2020-12-18 NOTE — Telephone Encounter (Signed)
Called and spoke with Denita at speciality pharmacy and delivery address confirmed and will have shipment for 1 month supply by 12/24/20.  Judeth Cornfield, RN

## 2020-12-18 NOTE — Telephone Encounter (Signed)
[facility] called and left message on nurse voicemail line stating they are calling to confirm delivery date information before they can ship.  Called [facility] and confirmed delivery information. They state patient's makena should arrive Tuesday 7/26.

## 2020-12-19 ENCOUNTER — Ambulatory Visit (INDEPENDENT_AMBULATORY_CARE_PROVIDER_SITE_OTHER): Payer: Self-pay | Admitting: General Practice

## 2020-12-19 ENCOUNTER — Other Ambulatory Visit: Payer: Self-pay

## 2020-12-19 VITALS — BP 120/58 | HR 86 | Ht 59.0 in | Wt 208.0 lb

## 2020-12-19 DIAGNOSIS — O09899 Supervision of other high risk pregnancies, unspecified trimester: Secondary | ICD-10-CM

## 2020-12-19 MED ORDER — HYDROXYPROGESTERONE CAPROATE 275 MG/1.1ML ~~LOC~~ SOAJ
275.0000 mg | Freq: Once | SUBCUTANEOUS | Status: AC
  Administered 2020-12-19: 275 mg via SUBCUTANEOUS

## 2020-12-19 NOTE — Progress Notes (Signed)
Patient was assessed and managed by nursing staff during this encounter. I have reviewed the chart and agree with the documentation and plan. I have also made any necessary editorial changes.  Catalina Antigua, MD 12/19/2020 4:45 PM

## 2020-12-19 NOTE — Progress Notes (Signed)
Shannon Myers here for 17-P  Injection.  Injection administered without complication. Patient will return in one week for next injection.  Marylynn Pearson, RN 12/19/2020  4:02 PM

## 2020-12-26 ENCOUNTER — Ambulatory Visit (INDEPENDENT_AMBULATORY_CARE_PROVIDER_SITE_OTHER): Payer: Self-pay | Admitting: General Practice

## 2020-12-26 ENCOUNTER — Other Ambulatory Visit: Payer: Self-pay

## 2020-12-26 VITALS — BP 110/67 | HR 83 | Ht 59.0 in | Wt 208.0 lb

## 2020-12-26 DIAGNOSIS — O09899 Supervision of other high risk pregnancies, unspecified trimester: Secondary | ICD-10-CM

## 2020-12-26 MED ORDER — HYDROXYPROGESTERONE CAPROATE 275 MG/1.1ML ~~LOC~~ SOAJ
275.0000 mg | Freq: Once | SUBCUTANEOUS | Status: AC
  Administered 2020-12-26: 275 mg via SUBCUTANEOUS

## 2020-12-26 NOTE — Progress Notes (Signed)
Zinia Suzy Bouchard here for 17-P  Injection. Patient has interest in giving injections at home herself. Education provided to patient on the process of giving medication at home & practiced using demonstration auto-injector. Patient provided return demonstration. Patient then gave 17p injection herself with RN present. Discussed processed of reordering refills herself and to provide her address for future shipments. Advised assistance with spanish interpreter should be available but if she has difficulty she can call our office. Makena auto-injectors come from [facility] [phone] Rx [phone]. Remaining 3 auto injectors given to patient as well as initial supply of gauze, bandaids, & alcohol wipes.   Marylynn Pearson, RN 12/26/2020  9:08 AM

## 2020-12-26 NOTE — Progress Notes (Signed)
Patient seen and assessed by nursing staff.  Agree with documentation and plan.  

## 2021-01-06 ENCOUNTER — Encounter: Payer: Self-pay | Admitting: Obstetrics and Gynecology

## 2021-01-06 ENCOUNTER — Ambulatory Visit (INDEPENDENT_AMBULATORY_CARE_PROVIDER_SITE_OTHER): Payer: Self-pay | Admitting: Obstetrics and Gynecology

## 2021-01-06 ENCOUNTER — Other Ambulatory Visit: Payer: Self-pay

## 2021-01-06 VITALS — BP 110/76 | HR 88 | Wt 207.0 lb

## 2021-01-06 DIAGNOSIS — Z789 Other specified health status: Secondary | ICD-10-CM

## 2021-01-06 DIAGNOSIS — O0990 Supervision of high risk pregnancy, unspecified, unspecified trimester: Secondary | ICD-10-CM

## 2021-01-06 DIAGNOSIS — O09899 Supervision of other high risk pregnancies, unspecified trimester: Secondary | ICD-10-CM

## 2021-01-06 NOTE — Progress Notes (Signed)
Subjective:  Georgette Jaclynn Laumann is a 35 y.o. [phone] at [redacted]w[redacted]d being seen today for ongoing prenatal care.  She is currently monitored for the following issues for this high-risk pregnancy and has Obesity; Supervision of high risk pregnancy, antepartum; History of preterm delivery, currently pregnant; Language barrier; Hematuria; and Back pain during pregnancy on their problem list.  Patient reports no complaints.  Contractions: Irritability. Vag. Bleeding: None.  Movement: Present. Denies leaking of fluid.   The following portions of the patient's history were reviewed and updated as appropriate: allergies, current medications, past family history, past medical history, past social history, past surgical history and problem list. Problem list updated.  Objective:   Vitals:   01/06/21 0936  BP: 110/76  Pulse: 88  Weight: 207 lb (93.9 kg)    Fetal Status: Fetal Heart Rate (bpm): 147   Movement: Present     General:  Alert, oriented and cooperative. Patient is in no acute distress.  Skin: Skin is warm and dry. No rash noted.   Cardiovascular: Normal heart rate noted  Respiratory: Normal respiratory effort, no problems with respiration noted  Abdomen: Soft, gravid, appropriate for gestational age. Pain/Pressure: Present     Pelvic:  Cervical exam deferred        Extremities: Normal range of motion.  Edema: None  Mental Status: Normal mood and affect. Normal behavior. Normal judgment and thought content.   Urinalysis:      Assessment and Plan:  Pregnancy: [id number] at [redacted]w[redacted]d  1. Supervision of high risk pregnancy, antepartum Stable Glucola next visit F/U growth scan next week  2. History of preterm delivery, currently pregnant Stable Continue with weekly Makena  3. Language barrier Video interrupter used during today's visit  Preterm labor symptoms and general obstetric precautions including but not limited to vaginal bleeding, contractions, leaking of fluid and fetal movement were  reviewed in detail with the patient. Please refer to After Visit Summary for other counseling recommendations.  Return in about 4 weeks (around 02/03/2021) for OB visit, face to face, MD only, fasting for glucola.   Hermina Staggers, MD

## 2021-01-06 NOTE — Patient Instructions (Signed)
Segundo trimestre de embarazo °Second Trimester of Pregnancy °El segundo trimestre de embarazo va desde la semana 13 hasta la semana 27. También se dice que va desde el mes 4 hasta el mes 6 de embarazo. Este suele ser el momento en el que mejor se siente. °Durante el segundo trimestre: °Las náuseas del embarazo han disminuido o han desaparecido. °Usted puede tener más energía. °Usted puede tener hambre con más frecuencia. °En esta época, el bebé en gestación (feto) crece muy rápido. Hacia el final del sexto mes, el bebé en gestación puede medir aproximadamente 12 pulgadas y pesar alrededor de 1½ libras. Es probable que comience a sentir que el bebé se mueve entre las 16 y las 20 semanas de embarazo. °Cambios en el cuerpo durante el segundo trimestre °Su organismo continúa atravesando por muchos cambios durante este período. Los cambios varían y generalmente vuelven a la normalidad después del nacimiento del bebé. °Cambios físicos °Aumentará más peso. °Podrán aparecer las primeras estrías en las caderas, el vientre (abdomen) y las mamas. °Las mamas crecerán y pueden doler. °Pueden aparecer zonas oscuras o manchas en el rostro. °Es posible que se forme una línea oscura desde el ombligo hasta la zona del pubis (linea nigra). °Tal vez haya cambios en el cabello. °Cambios en la salud °Es posible que tenga dolores de cabeza. °Es posible que tenga acidez estomacal. °Es posible que tenga dificultades para defecar (estreñimiento). °Es posible que tenga hemorroides o venas abultadas e hinchadas (venas varicosas). °Las encías pueden sangrarle. °Es posible que haga pis (orine) con mayor frecuencia. °Puede sentir dolor en la espalda. °Siga estas instrucciones en su casa: °Medicamentos °Use los medicamentos de venta libre y los recetados solamente como se lo haya indicado el médico. Algunos medicamentos no son seguros durante el embarazo. °Tome vitaminas prenatales que contengan por lo menos 600 microgramos (mcg) de ácido  fólico. °Comida y bebida °Consuma comidas saludables que incluyan lo siguiente: °Frutas y verduras frescas. °Cereales integrales. °Buenas fuentes de proteínas, como carne, huevos y tofu. °Productos lácteos con bajo contenido de grasa. °Evite la carne cruda y el jugo, la leche y el queso sin pasteurizar. °Es posible que deba tomar medidas para prevenir o tratar los problemas para defecar: °Beber suficiente líquido para mantener el pis (orina) de color amarillo pálido. °Come alimentos ricos en fibra. Entre ellos, frijoles, cereales integrales y frutas y verduras frescas. °Limitar los alimentos con alto contenido de grasa y azúcar. Estos incluyen alimentos fritos o dulces. °Actividad °Haga ejercicios solamente como se lo haya indicado el médico. La mayoría de las personas pueden realizar su actividad física habitual durante el embarazo. Intente realizar como mínimo 30 minutos de actividad física por lo menos 5 días a la semana. °Deje de hacer ejercicio si tiene dolor o cólicos en el vientre o en la zona lumbar. °No haga ejercicio si hace demasiado calor, hay demasiada humedad o se encuentra en un lugar de mucha altura (altitud elevada). °Evite levantar pesos excesivos. °Si lo desea, puede continuar teniendo relaciones sexuales, a menos que el médico le indique lo contrario. °Alivio del dolor y del malestar °Use un sostén que le brinde buen soporte si le duelen las mamas. °Dese baños de asiento con agua tibia para aliviar el dolor o las molestias causadas por las hemorroides. Use una crema para las hemorroides si el médico la autoriza. °Descanse con las piernas levantadas (elevadas) si tiene calambres en las piernas o dolor en la parte baja de la espalda. °Si desarrolla venas abultadas en las piernas: °Use   medias de compresión según las indicaciones de su médico. °Levante los pies durante 15 minutos, 3 o 4 veces por día. °Limite la sal en sus alimentos. °Seguridad °Use el cinturón de seguridad en todo momento mientras  vaya en auto. °Hable con el médico si alguien le está haciendo daño o gritando mucho. °Estilo de vida °No se dé baños de inmersión en agua caliente, baños turcos ni saunas. °No se haga duchas vaginales. No use tampones ni toallas higiénicas perfumadas. °Evite el contacto con las bandejas sanitarias de los gatos y la tierra que estos animales usan. Estos contienen gérmenes que pueden dañar al bebé y causar la pérdida del bebé ya sea aborto espontáneo o muerte fetal. °No consuma medicamentos a base de hierbas, drogas ilegales, ni medicamentos que el médico no haya autorizado. No beba alcohol. °No fume ni consuma ningún producto que contenga nicotina o tabaco. Si necesita ayuda para dejar de fumar, consulte al médico. °Instrucciones generales °Cumpla con todas las visitas de seguimiento. Esto es importante. °Consulte a su médico acerca de dónde se dictan clases prenatales cerca de donde vive. °Consulte a su médico sobre los alimentos que debe comer o pídale que la ayude a encontrar a un asesor. °Dónde buscar más información °American Pregnancy Association (Asociación Americana del Embarazo): americanpregnancy.org °American College of Obstetricians and Gynecologists (Colegio Estadounidense de Obstetras y Ginecólogos): www.acog.org °Office on Women's Health (Oficina para la Salud de la Mujer): womenshealth.gov/pregnancy °Comuníquese con un médico si: °Tiene un dolor de cabeza que no desaparece después de tomar analgésicos. °Nota cambios en la visión o ve manchas delante de los ojos. °Tiene cólicos o siente presión o dolor leves en la parte baja del vientre. °Sigue sintiendo como si fuera a vomitar (náuseas), vomita o hace deposiciones acuosas (diarrea). °Advierte líquido con mal olor que proviene de la vagina. °Siente dolor al orinar o hace orina con mal olor. °Tiene una gran hinchazón en la cara, las manos, las piernas, los tobillos o los pies. °Tiene fiebre. °Solicite ayuda de inmediato si: °Tiene una pérdida de  líquido por la vagina. °Tiene sangrado o pequeñas pérdidas vaginales. °Tiene cólicos o dolor muy intensos en el vientre. °Tiene dificultad para respirar. °Sientes dolor en el pecho. °Se desmaya. °No ha sentido que el bebé se moviera durante el período de tiempo que le dijo el médico. °Tiene dolor, hinchazón o enrojecimiento nuevos en un brazo o una pierna o se produce un aumento de alguno de estos síntomas. °Resumen °El segundo trimestre de embarazo va desde la semana 13 hasta la 27 (desde el mes 4 hasta el 6). °Consuma comidas saludables. °Haga ejercicios tal como le indicó el médico. La mayoría de las personas pueden realizar su actividad física habitual durante el embarazo. °No consuma medicamentos a base de hierbas, drogas ilegales, ni medicamentos que el médico no haya autorizado. No beba alcohol. °Llame al médico si se enferma o si nota algo inusual acerca de su embarazo. °Esta información no tiene como fin reemplazar el consejo del médico. Asegúrese de hacerle al médico cualquier pregunta que tenga. °Document Revised: 12/01/2019 Document Reviewed: 12/01/2019 °Elsevier Patient Education © 2022 Elsevier Inc. ° °

## 2021-01-07 ENCOUNTER — Encounter: Payer: Self-pay | Admitting: *Deleted

## 2021-01-13 ENCOUNTER — Ambulatory Visit: Payer: Self-pay | Attending: Obstetrics and Gynecology

## 2021-01-13 ENCOUNTER — Other Ambulatory Visit: Payer: Self-pay

## 2021-01-13 ENCOUNTER — Ambulatory Visit: Payer: Self-pay | Admitting: *Deleted

## 2021-01-13 VITALS — BP 121/56 | HR 89

## 2021-01-13 DIAGNOSIS — Z789 Other specified health status: Secondary | ICD-10-CM | POA: Insufficient documentation

## 2021-01-13 DIAGNOSIS — O0990 Supervision of high risk pregnancy, unspecified, unspecified trimester: Secondary | ICD-10-CM | POA: Insufficient documentation

## 2021-01-13 DIAGNOSIS — O09899 Supervision of other high risk pregnancies, unspecified trimester: Secondary | ICD-10-CM

## 2021-01-13 DIAGNOSIS — O09529 Supervision of elderly multigravida, unspecified trimester: Secondary | ICD-10-CM | POA: Insufficient documentation

## 2021-01-14 ENCOUNTER — Other Ambulatory Visit: Payer: Self-pay | Admitting: *Deleted

## 2021-01-14 DIAGNOSIS — O09522 Supervision of elderly multigravida, second trimester: Secondary | ICD-10-CM

## 2021-01-14 DIAGNOSIS — Z362 Encounter for other antenatal screening follow-up: Secondary | ICD-10-CM

## 2021-01-14 DIAGNOSIS — Z6834 Body mass index (BMI) 34.0-34.9, adult: Secondary | ICD-10-CM

## 2021-02-06 ENCOUNTER — Encounter: Payer: Self-pay | Admitting: Student

## 2021-02-06 ENCOUNTER — Other Ambulatory Visit: Payer: Self-pay | Admitting: General Practice

## 2021-02-06 ENCOUNTER — Other Ambulatory Visit: Payer: Self-pay

## 2021-02-06 DIAGNOSIS — O0990 Supervision of high risk pregnancy, unspecified, unspecified trimester: Secondary | ICD-10-CM

## 2021-02-07 ENCOUNTER — Ambulatory Visit (INDEPENDENT_AMBULATORY_CARE_PROVIDER_SITE_OTHER): Payer: Self-pay | Admitting: Certified Nurse Midwife

## 2021-02-07 VITALS — BP 117/82 | HR 96 | Wt 210.6 lb

## 2021-02-07 DIAGNOSIS — Z3A26 26 weeks gestation of pregnancy: Secondary | ICD-10-CM

## 2021-02-07 DIAGNOSIS — O0992 Supervision of high risk pregnancy, unspecified, second trimester: Secondary | ICD-10-CM

## 2021-02-07 DIAGNOSIS — O09892 Supervision of other high risk pregnancies, second trimester: Secondary | ICD-10-CM

## 2021-02-07 LAB — CBC
Hematocrit: 36.9 % (ref 34.0–46.6)
Hemoglobin: 12.3 g/dL (ref 11.1–15.9)
MCH: 30.1 pg (ref 26.6–33.0)
MCHC: 33.3 g/dL (ref 31.5–35.7)
MCV: 90 fL (ref 79–97)
Platelets: 211 10*3/uL (ref 150–450)
RBC: 4.08 x10E6/uL (ref 3.77–5.28)
RDW: 12.8 % (ref 11.7–15.4)
WBC: 9.9 10*3/uL (ref 3.4–10.8)

## 2021-02-07 LAB — GLUCOSE TOLERANCE, 2 HOURS W/ 1HR
Glucose, 1 hour: 140 mg/dL (ref 65–179)
Glucose, 2 hour: 128 mg/dL (ref 65–152)
Glucose, Fasting: 83 mg/dL (ref 65–91)

## 2021-02-07 LAB — HIV ANTIBODY (ROUTINE TESTING W REFLEX): HIV Screen 4th Generation wRfx: NONREACTIVE

## 2021-02-07 LAB — RPR: RPR Ser Ql: NONREACTIVE

## 2021-02-10 ENCOUNTER — Encounter: Payer: Self-pay | Admitting: *Deleted

## 2021-02-10 ENCOUNTER — Other Ambulatory Visit: Payer: Self-pay | Admitting: *Deleted

## 2021-02-10 ENCOUNTER — Ambulatory Visit: Payer: Self-pay | Admitting: *Deleted

## 2021-02-10 ENCOUNTER — Ambulatory Visit: Payer: Self-pay | Attending: Obstetrics

## 2021-02-10 ENCOUNTER — Other Ambulatory Visit: Payer: Self-pay

## 2021-02-10 VITALS — BP 107/62 | HR 90

## 2021-02-10 DIAGNOSIS — O09899 Supervision of other high risk pregnancies, unspecified trimester: Secondary | ICD-10-CM | POA: Insufficient documentation

## 2021-02-10 DIAGNOSIS — O09893 Supervision of other high risk pregnancies, third trimester: Secondary | ICD-10-CM

## 2021-02-10 DIAGNOSIS — E669 Obesity, unspecified: Secondary | ICD-10-CM

## 2021-02-10 DIAGNOSIS — O99212 Obesity complicating pregnancy, second trimester: Secondary | ICD-10-CM

## 2021-02-10 DIAGNOSIS — Z789 Other specified health status: Secondary | ICD-10-CM

## 2021-02-10 DIAGNOSIS — Z6834 Body mass index (BMI) 34.0-34.9, adult: Secondary | ICD-10-CM | POA: Insufficient documentation

## 2021-02-10 DIAGNOSIS — O09522 Supervision of elderly multigravida, second trimester: Secondary | ICD-10-CM | POA: Insufficient documentation

## 2021-02-10 DIAGNOSIS — Z3A27 27 weeks gestation of pregnancy: Secondary | ICD-10-CM

## 2021-02-10 DIAGNOSIS — O0990 Supervision of high risk pregnancy, unspecified, unspecified trimester: Secondary | ICD-10-CM

## 2021-02-10 DIAGNOSIS — Z362 Encounter for other antenatal screening follow-up: Secondary | ICD-10-CM | POA: Insufficient documentation

## 2021-02-10 NOTE — Progress Notes (Signed)
   PRENATAL VISIT NOTE  Subjective:  Shannon Myers is a 35 y.o. [phone] at [redacted]w[redacted]d being seen today for ongoing prenatal care.  She is currently monitored for the following issues for this high-risk pregnancy and has Obesity; Supervision of high risk pregnancy, antepartum; History of preterm delivery, currently pregnant; Language barrier; Hematuria; and Back pain during pregnancy on their problem list.  Patient reports occasional contractions.  Contractions: Irritability. Vag. Bleeding: None.  Movement: Present. Denies leaking of fluid.   The following portions of the patient's history were reviewed and updated as appropriate: allergies, current medications, past family history, past medical history, past social history, past surgical history and problem list.   Objective:   Vitals:   02/07/21 1052  BP: 117/82  Pulse: 96  Weight: 210 lb 9.6 oz (95.5 kg)    Fetal Status: Fetal Heart Rate (bpm): 135   Movement: Present     General:  Alert, oriented and cooperative. Patient is in no acute distress.  Skin: Skin is warm and dry. No rash noted.   Cardiovascular: Normal heart rate noted  Respiratory: Normal respiratory effort, no problems with respiration noted  Abdomen: Soft, gravid, appropriate for gestational age.  Pain/Pressure: Present     Pelvic: Cervical exam deferred        Extremities: Normal range of motion.  Edema: None  Mental Status: Normal mood and affect. Normal behavior. Normal judgment and thought content.   Assessment and Plan:  Pregnancy: [id number] at [redacted]w[redacted]d 1. Supervision of high risk pregnancy in second trimester - Doing well, feeling regular and vigorous fetal movement - Having occasional contractions but not consistent  2. [redacted] weeks gestation of pregnancy - Routine OB care  3. History of preterm delivery, currently pregnant in second trimester - Strong labor precautions given since this is when she began having contractions in her last pregnancy.  Preterm labor  symptoms and general obstetric precautions including but not limited to vaginal bleeding, contractions, leaking of fluid and fetal movement were reviewed in detail with the patient. Please refer to After Visit Summary for other counseling recommendations.   Return in about 2 weeks (around 02/21/2021) for IN-PERSON, HOB.  Future Appointments  Date Time Provider Department Center  02/10/2021  2:15 PM [facility] NURSE [facility]  02/10/2021  2:30 PM [facility] US3 [facility]  02/25/2021 10:15 AM Worthy Rancher, MD [facility]    Bernerd Limbo, CNM

## 2021-02-19 ENCOUNTER — Inpatient Hospital Stay (HOSPITAL_COMMUNITY)
Admission: AD | Admit: 2021-02-19 | Discharge: 2021-02-19 | Disposition: A | Discharge status: Home / Self Care | Payer: Self-pay | Attending: Obstetrics and Gynecology | Admitting: Obstetrics and Gynecology

## 2021-02-19 ENCOUNTER — Telehealth: Payer: Self-pay

## 2021-02-19 DIAGNOSIS — Z789 Other specified health status: Secondary | ICD-10-CM

## 2021-02-19 DIAGNOSIS — Z3A28 28 weeks gestation of pregnancy: Secondary | ICD-10-CM

## 2021-02-19 DIAGNOSIS — Z603 Acculturation difficulty: Secondary | ICD-10-CM

## 2021-02-19 DIAGNOSIS — O0990 Supervision of high risk pregnancy, unspecified, unspecified trimester: Secondary | ICD-10-CM

## 2021-02-19 DIAGNOSIS — J069 Acute upper respiratory infection, unspecified: Secondary | ICD-10-CM

## 2021-02-19 DIAGNOSIS — O09899 Supervision of other high risk pregnancies, unspecified trimester: Secondary | ICD-10-CM

## 2021-02-19 DIAGNOSIS — O99513 Diseases of the respiratory system complicating pregnancy, third trimester: Secondary | ICD-10-CM

## 2021-02-19 NOTE — Telephone Encounter (Signed)
Patient called front office and was transferred to clinical staff. Telephone call completed with interpreter Eda. Patient reports flu like symptoms began on Sunday. Reports negative rapid COVID test at home on Monday. Has been taking Tylenol every 4-5 hours (2 - Extra Strength Tylenol); explained to pt that this should be limited to every 6 hours. Pt reports taking temperature this AM; result was 105 degrees F. Reports abdominal pain and pressure, like a strong menstrual cramp. States she has been trying to drink as much water as she can. Reports nausea-- last took Phenergan yesterday. Reports having oatmeal this morning at 10AM. States that she is currently feeling dizzy, "like the room is spinning." Does not have a BP cuff at home. Does not have any adults at home with her. Currently watching her children. I explained that pt needs to go to the MAU for evaluation of temperature and dizziness. Pt begins crying. Explained to pt that we just feel that she needs to be evaluated to be sure she is not overly dehydrated and to make sure her and the baby are both safe. Pt agrees to call someone to take her to the hospital. Trenton Gammon understanding that it is important that she is evaluated as soon as possible.

## 2021-02-19 NOTE — MAU Provider Note (Addendum)
   S Ms. Shannon Myers is a 35 y.o. [id number] patient who presents to MAU today with complaint of runny nose and fever that started Sunday 02/16/21. Her temperature was 100.5 on 02/18/21 and 100.0 today. She has been taking Tylenol; last taken at 1200 today. She took a COVID test on Monday 02/17/21; result was negative. She denies cough, sore throat, loss of taste or smell. She also complains of abdominal pressure and the urge to urinate without having urine come out. She denies pain or burning with urination. Patient is COVID vaccinated and boosted.  O BP 127/75 (BP Location: Right Arm)   Pulse 93   Temp 97.9 F (36.6 C) (Oral)   Resp 20   Wt 95.1 kg   LMP 08/04/2020   SpO2 100%   BMI 42.33 kg/m  Physical Exam Constitutional:      Appearance: Normal appearance. She is obese.  HENT:     Nose: Congestion and rhinorrhea present.  Cardiovascular:     Rate and Rhythm: Normal rate.     Pulses: Normal pulses.     Heart sounds: Normal heart sounds.  Pulmonary:     Effort: Pulmonary effort is normal.     Breath sounds: Normal breath sounds.  Abdominal:     General: Bowel sounds are normal.     Palpations: Abdomen is soft.  Genitourinary:    Comments: Not indicated Neurological:     Mental Status: She is alert and oriented to person, place, and time.  Psychiatric:        Mood and Affect: Mood normal.        Behavior: Behavior normal.        Thought Content: Thought content normal.        Judgment: Judgment normal.   FHTs by doppler: 158 bpm   A Medical screening exam complete Upper respiratory tract infection, unspecified type  Language barrier [redacted] weeks gestation of pregnancy   P Discharge from MAU in stable condition List of safe meds in pregnancy Warning signs for worsening condition that would warrant emergency follow-up discussed Patient may return to MAU as needed  Keep scheduled appt with [facility] on 02/25/2021  Raelyn Mora, CNM 02/19/2021 4:20 PM

## 2021-02-19 NOTE — MAU Note (Signed)
Thinks she has had a bit of a cold,maybe a bit of a fever, 100.5 yesterday, 100 this morning, took some Tylenol.  Has had some dizziness today.  Did a home covid test on Monday, was neg. Symptoms started on Sunday, runny nose, then fever. No cough,  no sore throat, denies loss of taste or sense of smell.   Pressure in lower abd past few days- gives her urge to urinate, but she goes and nothing comes out

## 2021-02-19 NOTE — Discharge Instructions (Signed)

## 2021-02-25 ENCOUNTER — Ambulatory Visit (INDEPENDENT_AMBULATORY_CARE_PROVIDER_SITE_OTHER): Payer: Self-pay | Admitting: Obstetrics and Gynecology

## 2021-02-25 ENCOUNTER — Other Ambulatory Visit: Payer: Self-pay

## 2021-02-25 VITALS — BP 106/65 | HR 93 | Wt 208.8 lb

## 2021-02-25 DIAGNOSIS — Z789 Other specified health status: Secondary | ICD-10-CM

## 2021-02-25 DIAGNOSIS — O0990 Supervision of high risk pregnancy, unspecified, unspecified trimester: Secondary | ICD-10-CM

## 2021-02-25 DIAGNOSIS — O09899 Supervision of other high risk pregnancies, unspecified trimester: Secondary | ICD-10-CM

## 2021-02-25 DIAGNOSIS — Z23 Encounter for immunization: Secondary | ICD-10-CM

## 2021-02-25 NOTE — Progress Notes (Deleted)
   PRENATAL VISIT NOTE  Subjective:  Shannon Myers is a 35 y.o. [phone] at [redacted]w[redacted]d being seen today for ongoing prenatal care.  She is currently monitored for the following issues for this high-risk pregnancy and has Obesity; Supervision of high risk pregnancy, antepartum; History of preterm delivery, currently pregnant; Language barrier; Hematuria; and Back pain during pregnancy on their problem list.  Patient reports  irregular ctxns - nonpainful .  Contractions: Irritability. Vag. Bleeding: None.  Movement: Present. Denies leaking of fluid.   The following portions of the patient's history were reviewed and updated as appropriate: allergies, current medications, past family history, past medical history, past social history, past surgical history and problem list.   Objective:   Vitals:   02/25/21 1128  BP: 106/65  Pulse: 93  Weight: 208 lb 12.8 oz (94.7 kg)    Fetal Status: Fetal Heart Rate (bpm): 145 Fundal Height: 31 cm Movement: Present     General:  Alert, oriented and cooperative. Patient is in no acute distress.  Skin: Skin is warm and dry. No rash noted.   Cardiovascular: Normal heart rate noted  Respiratory: Normal respiratory effort, no problems with respiration noted  Abdomen: Soft, gravid, appropriate for gestational age.  Pain/Pressure: Present     Pelvic: Cervical exam deferred        Extremities: Normal range of motion.  Edema: None  Mental Status: Normal mood and affect. Normal behavior. Normal judgment and thought content.   Assessment and Plan:  Pregnancy: [id number] at [redacted]w[redacted]d 1. History of preterm delivery, currently pregnant - Currently doing home Makena - Has intermittent ctxns, but no pattern or pain. - Reviewed PTL precautions  2. Supervision of high risk pregnancy, antepartum - Normal labs at 28w - TDAP and flu given today - Growth/anatomy completion on 10/10  3. Language barrier - Video interpreter used throughout her visit.   Preterm labor  symptoms and general obstetric precautions including but not limited to vaginal bleeding, contractions, leaking of fluid and fetal movement were reviewed in detail with the patient. Please refer to After Visit Summary for other counseling recommendations.   Return in about 2 weeks (around 03/11/2021) for OB VISIT, MD only.  Future Appointments  Date Time Provider Department Center  03/10/2021  3:15 PM [facility] NURSE [facility]  03/10/2021  3:30 PM [facility] US3 [facility]    Milas Hock, MD

## 2021-02-25 NOTE — Progress Notes (Signed)
   PRENATAL VISIT NOTE  Subjective:  Shannon Myers is a 35 y.o. [id number] at [redacted]w[redacted]d being seen today for ongoing prenatal care.  She is currently monitored for the following issues for this high-risk pregnancy and has Obesity; Supervision of high risk pregnancy, antepartum; History of preterm delivery, currently pregnant; Language barrier; Hematuria; and Back pain during pregnancy on their problem list.  Patient reports  irregular ctxns - nonpainful .  Contractions: Irritability. Vag. Bleeding: None.  Movement: Present. Denies leaking of fluid.   The following portions of the patient's history were reviewed and updated as appropriate: allergies, current medications, past family history, past medical history, past social history, past surgical history and problem list.   Objective:   Vitals:   02/25/21 1128  BP: 106/65  Pulse: 93  Weight: 208 lb 12.8 oz (94.7 kg)    Fetal Status: Fetal Heart Rate (bpm): 145 Fundal Height: 31 cm Movement: Present     General:  Alert, oriented and cooperative. Patient is in no acute distress.  Skin: Skin is warm and dry. No rash noted.   Cardiovascular: Normal heart rate noted  Respiratory: Normal respiratory effort, no problems with respiration noted  Abdomen: Soft, gravid, appropriate for gestational age.  Pain/Pressure: Present     Pelvic: Cervical exam deferred        Extremities: Normal range of motion.  Edema: None  Mental Status: Normal mood and affect. Normal behavior. Normal judgment and thought content.   Assessment and Plan:  Pregnancy: [id number] at [redacted]w[redacted]d 1. History of preterm delivery, currently pregnant - Currently doing home Makena - Has intermittent ctxns, but no pattern or pain. - Reviewed PTL precautions  2. Supervision of high risk pregnancy, antepartum - Normal labs at 28w - TDAP and flu given today - Growth/anatomy completion on 10/10  3. Language barrier - Video interpreter used throughout her visit.   Preterm labor  symptoms and general obstetric precautions including but not limited to vaginal bleeding, contractions, leaking of fluid and fetal movement were reviewed in detail with the patient. Please refer to After Visit Summary for other counseling recommendations.   Return in about 2 weeks (around 03/11/2021) for OB VISIT, MD only.  Future Appointments  Date Time Provider Department Center  03/10/2021  3:15 PM [facility] NURSE [facility]  03/10/2021  3:30 PM [facility] US3 [facility]    Prabhav Faulkenberry, MD  

## 2021-03-10 ENCOUNTER — Other Ambulatory Visit: Payer: Self-pay

## 2021-03-10 ENCOUNTER — Ambulatory Visit: Payer: Self-pay | Admitting: *Deleted

## 2021-03-10 ENCOUNTER — Ambulatory Visit: Payer: Self-pay | Attending: Maternal & Fetal Medicine

## 2021-03-10 VITALS — BP 119/65 | HR 88

## 2021-03-10 DIAGNOSIS — Z3A31 31 weeks gestation of pregnancy: Secondary | ICD-10-CM

## 2021-03-10 DIAGNOSIS — O09899 Supervision of other high risk pregnancies, unspecified trimester: Secondary | ICD-10-CM | POA: Insufficient documentation

## 2021-03-10 DIAGNOSIS — O0943 Supervision of pregnancy with grand multiparity, third trimester: Secondary | ICD-10-CM

## 2021-03-10 DIAGNOSIS — Z789 Other specified health status: Secondary | ICD-10-CM

## 2021-03-10 DIAGNOSIS — O0990 Supervision of high risk pregnancy, unspecified, unspecified trimester: Secondary | ICD-10-CM

## 2021-03-10 DIAGNOSIS — O09523 Supervision of elderly multigravida, third trimester: Secondary | ICD-10-CM

## 2021-03-10 DIAGNOSIS — O09893 Supervision of other high risk pregnancies, third trimester: Secondary | ICD-10-CM | POA: Insufficient documentation

## 2021-03-11 ENCOUNTER — Other Ambulatory Visit: Payer: Self-pay | Admitting: *Deleted

## 2021-03-11 DIAGNOSIS — O0943 Supervision of pregnancy with grand multiparity, third trimester: Secondary | ICD-10-CM

## 2021-03-11 DIAGNOSIS — Z6841 Body Mass Index (BMI) 40.0 and over, adult: Secondary | ICD-10-CM

## 2021-03-11 DIAGNOSIS — O09523 Supervision of elderly multigravida, third trimester: Secondary | ICD-10-CM

## 2021-03-17 ENCOUNTER — Encounter: Payer: Self-pay | Admitting: Obstetrics & Gynecology

## 2021-03-17 ENCOUNTER — Other Ambulatory Visit: Payer: Self-pay

## 2021-03-17 ENCOUNTER — Ambulatory Visit (INDEPENDENT_AMBULATORY_CARE_PROVIDER_SITE_OTHER): Payer: Self-pay | Admitting: Obstetrics & Gynecology

## 2021-03-17 VITALS — BP 114/76 | HR 91 | Wt 207.1 lb

## 2021-03-17 DIAGNOSIS — R350 Frequency of micturition: Secondary | ICD-10-CM

## 2021-03-17 DIAGNOSIS — O0990 Supervision of high risk pregnancy, unspecified, unspecified trimester: Secondary | ICD-10-CM

## 2021-03-17 DIAGNOSIS — Z3A32 32 weeks gestation of pregnancy: Secondary | ICD-10-CM

## 2021-03-17 DIAGNOSIS — O09899 Supervision of other high risk pregnancies, unspecified trimester: Secondary | ICD-10-CM

## 2021-03-17 LAB — OB RESULTS CONSOLE GBS: GBS: POSITIVE

## 2021-03-17 NOTE — Patient Instructions (Signed)
Parto prematuro Preterm Labor La duracin normal de un embarazo es de 39 a 41 semanas. Se llama trabajo de parto prematuro cuando este se inicia antes de las 37 semanas de [place]. Los bebs que nacen de forma prematura y sobreviven pueden no estar completamente desarrollados y correr un mayor riesgo de tener problemas a largo plazo, como parlisis cerebral, retrasos en el desarrollo y problemas de la vista y el odo. Los bebs que nacen antes de tiempo pueden tener problemas poco despus del nacimiento. Los bebs prematuros pueden estar relacionados con la regulacin del azcar en la sangre, la temperatura corporal, la frecuencia cardaca y la frecuencia respiratoria. Estos bebs suelen tener problemas para alimentarse. El riesgo de tener problemas es mayor para los bebs que nacen antes de las 34 semanas de [place]. Cules son las causas? Se desconoce la causa exacta de esta afeccin. Qu incrementa el riesgo? Es ms probable que tenga un parto prematuro si tiene ciertos factores de riesgo que guardan relacin con sus antecedentes mdicos, problemas en el embarazo en curso y en los [place], y factores relacionados con el estilo de vida. Antecedentes mdicos Tiene anormalidades en el tero, por ejemplo, el cuello uterino corto. Tiene ITS (infecciones de transmisin sexual) u otras infecciones en las vas urinarias y la vagina. Tiene enfermedades crnicas, como problemas de coagulacin sangunea, diabetes o hipertensin arterial. Tiene sobrepeso o bajo peso. Embarazo en curso y Insurance underwriter anteriores Ha tenido trabajo de parto prematuro anteriormente. Tiene un embarazo gemelar o mltiple. Le han diagnosticado una afeccin en la que la placenta cubre el cuello uterino (placenta previa). Esper menos de 18 meses entre un parto y un Psychologist, occupational. El beb en gestacin tiene algunas anormalidades. Tiene sangrado vaginal durante el embarazo. Qued embarazada a travs de la fertilizacin in vitro  (FIV). Factores relacionados con el ambiente y el estilo de vida Consume productos que contienen tabaco o toma bebidas alcohlicas. Consume drogas. Tiene estrs y no cuenta con apoyo social. Sufre violencia domstica. Est expuesta a ciertas sustancias qumicas o contaminantes ambientales. Otros factores Es Adult nurse de [place] o mayor de 35 aos de edad. Cules son los signos o sntomas? Los sntomas de esta afeccin incluyen: Educational psychologist similares a los que ocurren durante el perodo menstrual. Los calambres pueden presentarse con diarrea. Dolor en el abdomen o en la parte inferior de la espalda. Contracciones regulares que se pueden sentir como un endurecimiento del abdomen. Una sensacin de mayor presin en la pelvis. Aumento de la secrecin de mucosidad acuosa o sanguinolenta de la vagina. Rotura de bolsa (rotura de saco amnitico). Cmo se diagnostica? Esta afeccin se diagnostica en funcin de lo siguiente: Los antecedentes mdicos y un examen fsico. Un examen plvico. Sherlyn Lees. Monitorear el tero para Tree surgeon. Otros estudios, incluidos los siguientes: Un hisopado del cuello uterino para Engineer, manufacturing una sustancia qumica llamada fibronectina fetal. Anlisis de [place]. Cmo se trata? El tratamiento de [address] afeccin depende del tiempo de su [place], su estado y la salud de su beb. El tratamiento puede incluir: Tomar medicamentos como, por ejemplo: Medicamentos hormonales. Estos se pueden administrar de forma temprana en el embarazo para ayudar a Visual merchandiser. Medicamentos para TEFL teacher las contracciones. Medicamentos que ayudan a [employer] del beb. Estos se pueden recetar si el riesgo de parto es [place]. Medicamentos para ayudar a proteger a su beb de complicaciones cerebrales y nerviosas, como la parlisis cerebral. Reposo en cama. Si el trabajo de parto se inicia antes de las [address] de  embarazo, es posible que deba hospitalizarse. Llevar  a cabo el parto. Siga estas instrucciones en su casa:  No consuma ningn producto que contenga nicotina o tabaco. Estos productos incluyen cigarrillos, tabaco para mascar y aparatos de vapeo, como los Administrator, Civil Service. Si necesita ayuda para dejar de fumar, consulte al mdico. No beba alcohol. Use los medicamentos de venta libre y los recetados solamente como se lo haya indicado el mdico. Haga reposo como se lo haya indicado el mdico. Retome sus actividades normales segn lo indicado por el mdico. Pregntele al mdico qu actividades son seguras para usted. Concurra a todas las visitas de seguimiento. Esto es importante. Cmo se previene? Para aumentar las probabilidades de tener un embarazo a trmino, Financial planner en cuenta lo siguiente: No consuma drogas ni use medicamentos que no le hayan recetado Academic librarian. Hable con el mdico antes de tomar suplementos a base de hierbas aunque los [employer]. Asegrese de llegar a un peso Office manager. Tenga cuidado con las infecciones. Si cree que puede tener una infeccin, consulte al mdico para que la revisen. Los sntomas de infeccin pueden incluir los siguientes: [place]. Secrecin vaginal anormal o con mal olor. Ardor o dolor al [employer]. Necesidad urgente de Geographical information systems officer. Miccin frecuente u orinar pequeas cantidades con frecuencia. Sangre en la orina u orina que huele mal o de manera inusual. Dnde buscar ms informacin U.S. Department of Health and [employer], Office on [employer] (Oficina para la Salud de la Mujer del Departamento de Salud y Servicios Humanos de los EE. UU.): http://hoffman.com/ [employer] of Obstetricians and Gynecologists (Colegio Estadounidense de Obstetras y Scientific laboratory technician): www.acog.org Centers for Disease Control and Prevention, Preterm Birth (Centros para Air traffic controller y Engineer, agricultural Prevencin de [place], [place] prematuro): FootballExhibition.com.br Comunquese con un mdico  si: Piensa que est teniendo un trabajo de parto prematuro. Tiene signos o sntomas de trabajo de [employer]. Tiene sntomas de infeccin. Solicite ayuda de inmediato si: Tiene contracciones dolorosas y regulares cada 5 minutos o menos. Rompe la bolsa. Resumen Se llama trabajo de parto prematuro cuando se inicia antes de las 37 semanas de [place]. El parto prematuro aumenta el riesgo del beb de desarrollar problemas a Air cabin crew. Es ms probable que tenga un parto prematuro si tiene ciertos factores de riesgo que guardan relacin con sus antecedentes mdicos, problemas en el embarazo en curso y en los [place], y factores relacionados con el estilo de vida. Concurra a todas las visitas de seguimiento. Esto es importante. Comunquese con un mdico si tiene signos o sntomas de trabajo de [employer]. Esta informacin no tiene Theme park manager el consejo del mdico. Asegrese de hacerle al mdico cualquier pregunta que tenga. Document Revised: 06/11/2020 Document Reviewed: 06/11/2020 Elsevier Patient Education  2022 [employer].

## 2021-03-17 NOTE — Progress Notes (Signed)
   PRENATAL VISIT NOTE  Subjective:  Shannon Myers is a 35 y.o. [phone] at [redacted]w[redacted]d being seen today for ongoing prenatal care.  Patient is Spanish-speaking only, interpreter present for this encounter. She is currently monitored for the following issues for this low-risk pregnancy and has Obesity; Supervision of high risk pregnancy, antepartum; History of preterm delivery, currently pregnant; Language barrier; Hematuria; and Back pain during pregnancy on their problem list.  Patient reports  increased urinary frequency, no dysuria, no back pain, no fevers .  Contractions: Irritability. Vag. Bleeding: None.  Movement: Present. Denies leaking of fluid.   The following portions of the patient's history were reviewed and updated as appropriate: allergies, current medications, past family history, past medical history, past social history, past surgical history and problem list.   Objective:   Vitals:   03/17/21 1155  BP: 114/76  Pulse: 91  Weight: 207 lb 1.6 oz (93.9 kg)    Fetal Status: Fetal Heart Rate (bpm): 135   Movement: Present     General:  Alert, oriented and cooperative. Patient is in no acute distress.  Skin: Skin is warm and dry. No rash noted.   Cardiovascular: Normal heart rate noted  Respiratory: Normal respiratory effort, no problems with respiration noted  Abdomen: Soft, gravid, appropriate for gestational age.  Pain/Pressure: Present     Pelvic: Cervical exam deferred        Extremities: Normal range of motion.  Edema: None  Mental Status: Normal mood and affect. Normal behavior. Normal judgment and thought content.   Assessment and Plan:  Pregnancy: [id number] at [redacted]w[redacted]d 1. Increased urinary frequency - Culture, OB Urine done, will follow up results and manage accordingly.  2. History of preterm delivery, currently pregnant Continue weekly Makena  3. [redacted] weeks gestation of pregnancy 4. Supervision of high risk pregnancy, antepartum Continue scans as per  MFM. Preterm labor symptoms and general obstetric precautions including but not limited to vaginal bleeding, contractions, leaking of fluid and fetal movement were reviewed in detail with the patient. Please refer to After Visit Summary for other counseling recommendations.   Return in about 2 weeks (around 03/31/2021) for OFFICE OB VISIT (MD or APP).  Future Appointments  Date Time Provider Department Center  03/31/2021 10:15 AM Hermina Staggers, MD [facility]  03/31/2021 11:15 AM [facility] NST [facility]  04/08/2021  3:00 PM [facility] NURSE [facility]  04/08/2021  3:15 PM [facility] US2 [facility]  04/21/2021  1:35 PM Reva Bores, MD [facility]  04/21/2021  2:15 PM [facility] NST [facility]    Jaynie Collins, MD'

## 2021-03-21 LAB — URINE CULTURE, OB REFLEX

## 2021-03-21 LAB — CULTURE, OB URINE

## 2021-03-23 ENCOUNTER — Encounter: Payer: Self-pay | Admitting: Obstetrics & Gynecology

## 2021-03-23 DIAGNOSIS — R8271 Bacteriuria: Secondary | ICD-10-CM

## 2021-03-23 HISTORY — DX: Bacteriuria: R82.71

## 2021-03-31 ENCOUNTER — Ambulatory Visit (INDEPENDENT_AMBULATORY_CARE_PROVIDER_SITE_OTHER): Payer: Self-pay | Admitting: Obstetrics and Gynecology

## 2021-03-31 ENCOUNTER — Encounter: Payer: Self-pay | Admitting: Obstetrics and Gynecology

## 2021-03-31 ENCOUNTER — Ambulatory Visit (INDEPENDENT_AMBULATORY_CARE_PROVIDER_SITE_OTHER): Payer: Self-pay

## 2021-03-31 ENCOUNTER — Other Ambulatory Visit: Payer: Self-pay

## 2021-03-31 ENCOUNTER — Ambulatory Visit (INDEPENDENT_AMBULATORY_CARE_PROVIDER_SITE_OTHER): Payer: Self-pay | Admitting: General Practice

## 2021-03-31 ENCOUNTER — Encounter: Payer: Self-pay | Admitting: General Practice

## 2021-03-31 VITALS — BP 108/65 | HR 89 | Wt 214.1 lb

## 2021-03-31 DIAGNOSIS — R8271 Bacteriuria: Secondary | ICD-10-CM

## 2021-03-31 DIAGNOSIS — Z789 Other specified health status: Secondary | ICD-10-CM

## 2021-03-31 DIAGNOSIS — O99210 Obesity complicating pregnancy, unspecified trimester: Secondary | ICD-10-CM

## 2021-03-31 DIAGNOSIS — O09899 Supervision of other high risk pregnancies, unspecified trimester: Secondary | ICD-10-CM

## 2021-03-31 DIAGNOSIS — O0990 Supervision of high risk pregnancy, unspecified, unspecified trimester: Secondary | ICD-10-CM

## 2021-03-31 DIAGNOSIS — Z3A34 34 weeks gestation of pregnancy: Secondary | ICD-10-CM

## 2021-03-31 NOTE — Progress Notes (Signed)
Opened in error

## 2021-03-31 NOTE — Progress Notes (Signed)
Pt informed that the ultrasound is considered a limited OB ultrasound and is not intended to be a complete ultrasound exam.  Patient also informed that the ultrasound is not being completed with the intent of assessing for fetal or placental anomalies or any pelvic abnormalities.  Explained that the purpose of today's ultrasound is to assess for  BPP, presentation, and AFI.  Patient acknowledges the purpose of the exam and the limitations of the study.     Jamaul Heist H RN BSN 03/31/21  

## 2021-03-31 NOTE — Patient Instructions (Addendum)
Tercer trimestre de embarazo °Third Trimester of Pregnancy °El tercer trimestre de embarazo va desde la semana 28 hasta la semana 40. También se dice que va desde el mes 7 hasta el mes 9. En este trimestre, el bebé en gestación (feto) crece muy rápidamente. Hacia el final del noveno mes, el bebé en gestación mide alrededor de 20 pulgadas (45 cm) de largo. Pesa entre 6 y 10 libras (2,70 y 4,50 kg). °Cambios en el cuerpo durante el tercer trimestre °Su organismo continúa atravesando por muchos cambios durante este período. Los cambios varían y generalmente vuelven a la normalidad después del nacimiento del bebé. °Cambios físicos °Seguirá aumentando de peso. Puede ser que aumente entre 25 y 35 libras (11 y 16 kg) hacia el final del embarazo. Si tiene bajo peso, puede aumentar entre 28 y 40 lb (unos 13 a 18 kg). Si tiene sobrepeso, puede aumentar entre 15 y 25 libras (unos 7 a 11 kg). °Podrán aparecer las primeras estrías en las caderas, el vientre (abdomen) y las mamas. °Las mamas seguirán creciendo y pueden doler. Un líquido amarillo (calostro) puede salir de sus pechos. Esta es la primera leche que usted produce para el bebé. °Tal vez haya cambios en el cabello. °El ombligo puede salir hacia afuera. °Puede observar que se le hinchan más las manos, la cara o los tobillos. °Cambios en la salud °Es posible que tenga acidez estomacal. °Es posible que tenga dificultades para defecar (estreñimiento). °Pueden aparecerle hemorroides. Estas son venas hinchadas en el ano que pueden picar o doler. °Puede comenzar a tener venas hinchadas (várices) en las piernas. °Puede presentar más dolor en la pelvis, la espalda o los muslos. °Puede presentar más hormigueo o entumecimiento en las manos, los brazos y las piernas. La piel de su vientre también puede sentirse entumecida. °Es posible que sienta falta de [place] a medida que el útero se agranda. °Otros cambios °Es posible que haga pis (orine) con mayor frecuencia. °Puede tener más  problemas para dormir. °Puede notar que el bebé en gestación “baja” o se mueve más hacia bajo, en el vientre. °Puede notar más secreción proveniente de la vagina. °Puede sentir las articulaciones flojas y puede sentir dolor alrededor del hueso pélvico. °Siga estas instrucciones en su casa: °Medicamentos °Use los medicamentos de venta libre y los recetados solamente como se lo haya indicado el médico. Algunos medicamentos no son seguros durante el embarazo. °Tome vitaminas prenatales que contengan por lo menos 600 microgramos (mcg) de ácido fólico. °Comida y bebida °Consuma comidas saludables que incluyan lo siguiente: °Frutas y verduras frescas. °Cereales integrales. °Buenas fuentes de proteínas, como carne, huevos y tofu. °Productos lácteos con bajo contenido de grasa. °Evite la carne cruda y el jugo, la leche y el queso sin pasteurizar. Estos portan gérmenes que pueden provocar daño tanto a usted como al bebé. °Tome 4 o 5 comidas pequeñas en lugar de 3 comidas abundantes al día. °Es posible que deba tomar medidas para prevenir o tratar los problemas para defecar: °Beber suficiente líquido para mantener el pis (orina) de color amarillo pálido. °Come alimentos ricos en fibra. Entre ellos, frijoles, cereales integrales y frutas y verduras frescas. °Limitar los alimentos con alto contenido de grasa y azúcar. Estos incluyen alimentos fritos o dulces. °Actividad °Haga ejercicios solamente como se lo haya indicado el médico. Interrumpa la actividad física si comienza a tener cólicos en el útero. °Evite levantar pesos excesivos. °No haga ejercicio si hace demasiado calor, hay demasiada humedad o se encuentra en un lugar de mucha altura (  altitud elevada). °Si lo desea, puede continuar teniendo relaciones sexuales, a menos que el médico le indique lo contrario. °Alivio del dolor y del malestar °Haga pausas con frecuencia y descanse con las piernas levantadas (elevadas) si tiene calambres en las piernas o dolor en la parte  baja de la espalda. °Dese baños de asiento con agua tibia para aliviar el dolor o las molestias causadas por las hemorroides. Use una crema para las hemorroides si el médico la autoriza. °Use un sostén que le brinde buen soporte si sus mamas están sensibles. °Si desarrolla venas hinchadas y abultadas en las piernas: °Use medias de compresión según las indicaciones de su médico. °Levante los pies durante 15 minutos, 3 o 4 veces por día. °Limite la sal en sus alimentos. °Seguridad °Hable con el médico antes de recorrer largas distancias. °No se dé baños de inmersión en agua caliente, baños turcos ni saunas. °Use el cinturón de seguridad en todo momento mientras vaya en auto. °Hable con el médico si alguien le está haciendo daño o gritando mucho. °Preparación para la llegada del bebé °Para prepararse para la llegada de su bebé: °Tome clases prenatales. °Visite el hospital y recorra el área de maternidad. °Compre un asiento de seguridad orientado hacia atrás para llevar al bebé en el automóvil. Aprenda cómo instalarlo en el auto. °Prepare la habitación del bebé. Saque todas las almohadas y los animales de peluche de la cuna del bebé. °Instrucciones generales °Evite el contacto con las bandejas sanitarias de los gatos y la tierra que estos animales usan. Estos contienen gérmenes que pueden dañar al bebé y causar la pérdida del bebé ya sea aborto espontáneo o muerte fetal. °No se haga duchas vaginales ni use tampones. No use tampones ni toallas higiénicas perfumadas. °No fume ni consuma ningún producto que contenga nicotina o tabaco. Si necesita ayuda para dejar de fumar, consulte al médico. °No beba alcohol. °No use medicamentos a base de hierbas, drogas ilegales, ni medicamentos que el médico no haya autorizado. Las sustancias químicas de estos productos pueden afectar al bebé. °Cumpla con todas las visitas de seguimiento. Esto es importante. °Dónde buscar más información °American Pregnancy Association (Asociación  Americana del Embarazo): americanpregnancy.org °American College of Obstetricians and Gynecologists (Colegio Estadounidense de Obstetras y Ginecólogos): www.acog.org °Office on Women's Health (Oficina para la Salud de la Mujer): womenshealth.gov/pregnancy °Comuníquese con un médico si: °Tiene fiebre. °Tiene cólicos leves o siente presión en la parte baja del vientre. °Sufre un dolor persistente en el abdomen. °Vomita o hace deposiciones acuosas (diarrea). °Advierte líquido con mal olor que proviene de la vagina. °Siente dolor al orinar o hace orina con mal olor. °Tiene un dolor de cabeza que no desaparece después de tomar analgésicos. °Nota cambios en la visión o ve manchas delante de los ojos. °Solicite ayuda de inmediato si: °Rompe la bolsa. °Tiene contracciones regulares separadas por menos de 5 minutos. °Tiene sangrado o pequeñas pérdidas vaginales. °Tiene cólicos o dolor muy intensos en el vientre. °Tiene dificultad para respirar. °Sientes dolor en el pecho. °Se desmaya. °No ha sentido al bebé moverse durante el tiempo que le indicó el médico. °Tiene dolor, hinchazón o enrojecimiento nuevos en un brazo o una pierna o se produce un aumento de alguno de estos síntomas. °Resumen °El tercer trimestre comprende desde la semana 28 hasta la semana 40 (desde el mes 7 hasta el mes 9). Esta es la época en que el bebé en gestación crece muy rápidamente. °Durante este período, las molestias pueden aumentar a medida que usted sube   de peso y el bebé crece. °Prepárese para la llegada del bebé: asista a las clases prenatales, compre un asiento de seguridad orientado hacia atrás para llevar al bebé en auto y prepare la habitación del bebé. °Solicite ayuda de inmediato si tiene sangrado por la vagina, siente dolor en el pecho y tiene dificultad para respirar, o si no ha sentido al bebé moverse durante el tiempo que le indicó el médico. °Esta información no tiene como fin reemplazar el consejo del médico. Asegúrese de hacerle al  médico cualquier pregunta que tenga. °Document Revised: 11/29/2019 Document Reviewed: 11/29/2019 °Elsevier Patient Education © 2022 Elsevier Inc. ° °

## 2021-03-31 NOTE — Progress Notes (Signed)
Subjective:  Shannon Myers is a 35 y.o. [id number] at [redacted]w[redacted]d being seen today for ongoing prenatal care.  She is currently monitored for the following issues for this high-risk pregnancy and has Obesity; Supervision of high risk pregnancy, antepartum; History of preterm delivery, currently pregnant; Language barrier; Hematuria; Back pain during pregnancy; and Group B streptococcal bacteriuria on their problem list.  Patient reports white/watery discharge off/on last week. No bleeding. No recent intercourse.  Contractions: Irritability. Vag. Bleeding: None.  Movement: Present. Denies leaking of fluid.   The following portions of the patient's history were reviewed and updated as appropriate: allergies, current medications, past family history, past medical history, past social history, past surgical history and problem list. Problem list updated.  Objective:   Vitals:   03/31/21 1039  BP: 108/65  Pulse: 89  Weight: 214 lb 1.6 oz (97.1 kg)    Fetal Status: Fetal Heart Rate (bpm): 135   Movement: Present     General:  Alert, oriented and cooperative. Patient is in no acute distress.  Skin: Skin is warm and dry. No rash noted.   Cardiovascular: Normal heart rate noted  Respiratory: Normal respiratory effort, no problems with respiration noted  Abdomen: Soft, gravid, appropriate for gestational age. Pain/Pressure: Present     Pelvic:  Cervical exam performed      Fern collected and negative  Extremities: Normal range of motion.  Edema: Trace  Mental Status: Normal mood and affect. Normal behavior. Normal judgment and thought content.   Urinalysis:      Assessment and Plan:  Pregnancy: [id number] at [redacted]w[redacted]d  1. Supervision of high risk pregnancy, antepartum Stable Fern test negative Pt reassured  2. History of preterm delivery, currently pregnant Continue with weekly Makena. Pt waiting on her shipment. Instructed her to call if shipment does not arrive U/S, growth 48 % 10/10 Antenatal  testing and serial growth scans as per MFM  3. Group B streptococcal bacteriuria Tx while in labor  4. Language barrier In person interrupter used during today's visit  Preterm labor symptoms and general obstetric precautions including but not limited to vaginal bleeding, contractions, leaking of fluid and fetal movement were reviewed in detail with the patient. Please refer to After Visit Summary for other counseling recommendations.  Return in about 2 weeks (around 04/14/2021) for OB visit, face to face, MD only.   Hermina Staggers, MD

## 2021-04-08 ENCOUNTER — Ambulatory Visit: Payer: Self-pay | Admitting: *Deleted

## 2021-04-08 ENCOUNTER — Other Ambulatory Visit: Payer: Self-pay | Admitting: Obstetrics

## 2021-04-08 ENCOUNTER — Ambulatory Visit: Payer: Self-pay | Attending: Obstetrics

## 2021-04-08 ENCOUNTER — Encounter: Payer: Self-pay | Admitting: *Deleted

## 2021-04-08 ENCOUNTER — Other Ambulatory Visit: Payer: Self-pay

## 2021-04-08 VITALS — BP 118/69 | HR 91

## 2021-04-08 DIAGNOSIS — Z6841 Body Mass Index (BMI) 40.0 and over, adult: Secondary | ICD-10-CM

## 2021-04-08 DIAGNOSIS — O09523 Supervision of elderly multigravida, third trimester: Secondary | ICD-10-CM

## 2021-04-08 DIAGNOSIS — R8271 Bacteriuria: Secondary | ICD-10-CM

## 2021-04-08 DIAGNOSIS — Z789 Other specified health status: Secondary | ICD-10-CM | POA: Insufficient documentation

## 2021-04-08 DIAGNOSIS — O09899 Supervision of other high risk pregnancies, unspecified trimester: Secondary | ICD-10-CM | POA: Insufficient documentation

## 2021-04-08 DIAGNOSIS — O0943 Supervision of pregnancy with grand multiparity, third trimester: Secondary | ICD-10-CM

## 2021-04-08 DIAGNOSIS — Z3A35 35 weeks gestation of pregnancy: Secondary | ICD-10-CM

## 2021-04-08 DIAGNOSIS — O99213 Obesity complicating pregnancy, third trimester: Secondary | ICD-10-CM

## 2021-04-08 DIAGNOSIS — E669 Obesity, unspecified: Secondary | ICD-10-CM

## 2021-04-08 DIAGNOSIS — Z3689 Encounter for other specified antenatal screening: Secondary | ICD-10-CM

## 2021-04-08 DIAGNOSIS — O0990 Supervision of high risk pregnancy, unspecified, unspecified trimester: Secondary | ICD-10-CM

## 2021-04-10 ENCOUNTER — Other Ambulatory Visit: Payer: Self-pay | Admitting: *Deleted

## 2021-04-10 DIAGNOSIS — O09523 Supervision of elderly multigravida, third trimester: Secondary | ICD-10-CM

## 2021-04-14 ENCOUNTER — Ambulatory Visit (INDEPENDENT_AMBULATORY_CARE_PROVIDER_SITE_OTHER): Payer: Self-pay | Admitting: Obstetrics and Gynecology

## 2021-04-14 ENCOUNTER — Other Ambulatory Visit: Payer: Self-pay

## 2021-04-14 ENCOUNTER — Encounter: Payer: Self-pay | Admitting: Obstetrics and Gynecology

## 2021-04-14 ENCOUNTER — Other Ambulatory Visit (HOSPITAL_COMMUNITY)
Admission: RE | Admit: 2021-04-14 | Discharge: 2021-04-14 | Disposition: A | Discharge status: Home / Self Care | Payer: Self-pay | Source: Ambulatory Visit | Attending: Obstetrics and Gynecology | Admitting: Obstetrics and Gynecology

## 2021-04-14 VITALS — BP 115/72 | HR 91 | Wt 215.8 lb

## 2021-04-14 DIAGNOSIS — O0990 Supervision of high risk pregnancy, unspecified, unspecified trimester: Secondary | ICD-10-CM

## 2021-04-14 DIAGNOSIS — O09899 Supervision of other high risk pregnancies, unspecified trimester: Secondary | ICD-10-CM

## 2021-04-14 DIAGNOSIS — R8271 Bacteriuria: Secondary | ICD-10-CM

## 2021-04-14 DIAGNOSIS — Z789 Other specified health status: Secondary | ICD-10-CM

## 2021-04-14 LAB — OB RESULTS CONSOLE GC/CHLAMYDIA: Gonorrhea: NEGATIVE

## 2021-04-14 NOTE — Patient Instructions (Signed)
Tercer trimestre de embarazo °Third Trimester of Pregnancy °El tercer trimestre de embarazo va desde la semana 28 hasta la semana 40. También se dice que va desde el mes 7 hasta el mes 9. En este trimestre, el bebé en gestación (feto) crece muy rápidamente. Hacia el final del noveno mes, el bebé en gestación mide alrededor de 20 pulgadas (45 cm) de largo. Pesa entre 6 y 10 libras (2,70 y 4,50 kg). °Cambios en el cuerpo durante el tercer trimestre °Su organismo continúa atravesando por muchos cambios durante este período. Los cambios varían y generalmente vuelven a la normalidad después del nacimiento del bebé. °Cambios físicos °Seguirá aumentando de peso. Puede ser que aumente entre 25 y 35 libras (11 y 16 kg) hacia el final del embarazo. Si tiene bajo peso, puede aumentar entre 28 y 40 lb (unos 13 a 18 kg). Si tiene sobrepeso, puede aumentar entre 15 y 25 libras (unos 7 a 11 kg). °Podrán aparecer las primeras estrías en las caderas, el vientre (abdomen) y las mamas. °Las mamas seguirán creciendo y pueden doler. Un líquido amarillo (calostro) puede salir de sus pechos. Esta es la primera leche que usted produce para el bebé. °Tal vez haya cambios en el cabello. °El ombligo puede salir hacia afuera. °Puede observar que se le hinchan más las manos, la cara o los tobillos. °Cambios en la salud °Es posible que tenga acidez estomacal. °Es posible que tenga dificultades para defecar (estreñimiento). °Pueden aparecerle hemorroides. Estas son venas hinchadas en el ano que pueden picar o doler. °Puede comenzar a tener venas hinchadas (várices) en las piernas. °Puede presentar más dolor en la pelvis, la espalda o los muslos. °Puede presentar más hormigueo o entumecimiento en las manos, los brazos y las piernas. La piel de su vientre también puede sentirse entumecida. °Es posible que sienta falta de Kerrianne a medida que el útero se agranda. °Otros cambios °Es posible que haga pis (orine) con mayor frecuencia. °Puede tener más  problemas para dormir. °Puede notar que el bebé en gestación “baja” o se mueve más hacia bajo, en el vientre. °Puede notar más secreción proveniente de la vagina. °Puede sentir las articulaciones flojas y puede sentir dolor alrededor del hueso pélvico. °Siga estas instrucciones en su casa: °Medicamentos °Use los medicamentos de venta libre y los recetados solamente como se lo haya indicado el médico. Algunos medicamentos no son seguros durante el embarazo. °Tome vitaminas prenatales que contengan por lo menos 600 microgramos (mcg) de ácido fólico. °Comida y bebida °Consuma comidas saludables que incluyan lo siguiente: °Frutas y verduras frescas. °Cereales integrales. °Buenas fuentes de proteínas, como carne, huevos y tofu. °Productos lácteos con bajo contenido de grasa. °Evite la carne cruda y el jugo, la leche y el queso sin pasteurizar. Estos portan gérmenes que pueden provocar daño tanto a usted como al bebé. °Tome 4 o 5 comidas pequeñas en lugar de 3 comidas abundantes al día. °Es posible que deba tomar medidas para prevenir o tratar los problemas para defecar: °Beber suficiente líquido para mantener el pis (orina) de color amarillo pálido. °Come alimentos ricos en fibra. Entre ellos, frijoles, cereales integrales y frutas y verduras frescas. °Limitar los alimentos con alto contenido de grasa y azúcar. Estos incluyen alimentos fritos o dulces. °Actividad °Haga ejercicios solamente como se lo haya indicado el médico. Interrumpa la actividad física si comienza a tener cólicos en el útero. °Evite levantar pesos excesivos. °No haga ejercicio si hace demasiado calor, hay demasiada humedad o se encuentra en un lugar de mucha altura (  altitud elevada). °Si lo desea, puede continuar teniendo relaciones sexuales, a menos que el médico le indique lo contrario. °Alivio del dolor y del malestar °Haga pausas con frecuencia y descanse con las piernas levantadas (elevadas) si tiene calambres en las piernas o dolor en la parte  baja de la espalda. °Dese baños de asiento con agua tibia para aliviar el dolor o las molestias causadas por las hemorroides. Use una crema para las hemorroides si el médico la autoriza. °Use un sostén que le brinde buen soporte si sus mamas están sensibles. °Si desarrolla venas hinchadas y abultadas en las piernas: °Use medias de compresión según las indicaciones de su médico. °Levante los pies durante 15 minutos, 3 o 4 veces por día. °Limite la sal en sus alimentos. °Seguridad °Hable con el médico antes de recorrer largas distancias. °No se dé baños de inmersión en agua caliente, baños turcos ni saunas. °Use el cinturón de seguridad en todo momento mientras vaya en auto. °Hable con el médico si alguien le está haciendo daño o gritando mucho. °Preparación para la llegada del bebé °Para prepararse para la llegada de su bebé: °Tome clases prenatales. °Visite el hospital y recorra el área de maternidad. °Compre un asiento de seguridad orientado hacia atrás para llevar al bebé en el automóvil. Aprenda cómo instalarlo en el auto. °Prepare la habitación del bebé. Saque todas las almohadas y los animales de peluche de la cuna del bebé. °Instrucciones generales °Evite el contacto con las bandejas sanitarias de los gatos y la tierra que estos animales usan. Estos contienen gérmenes que pueden dañar al bebé y causar la pérdida del bebé ya sea aborto espontáneo o muerte fetal. °No se haga duchas vaginales ni use tampones. No use tampones ni toallas higiénicas perfumadas. °No fume ni consuma ningún producto que contenga nicotina o tabaco. Si necesita ayuda para dejar de fumar, consulte al médico. °No beba alcohol. °No use medicamentos a base de hierbas, drogas ilegales, ni medicamentos que el médico no haya autorizado. Las sustancias químicas de estos productos pueden afectar al bebé. °Cumpla con todas las visitas de seguimiento. Esto es importante. °Dónde buscar más información °American Pregnancy Association (Asociación  Americana del Embarazo): americanpregnancy.org °American College of Obstetricians and Gynecologists (Colegio Estadounidense de Obstetras y Ginecólogos): www.acog.org °Office on Women's Health (Oficina para la Salud de la Mujer): womenshealth.gov/pregnancy °Comuníquese con un médico si: °Tiene fiebre. °Tiene cólicos leves o siente presión en la parte baja del vientre. °Sufre un dolor persistente en el abdomen. °Vomita o hace deposiciones acuosas (diarrea). °Advierte líquido con mal olor que proviene de la vagina. °Siente dolor al orinar o hace orina con mal olor. °Tiene un dolor de cabeza que no desaparece después de tomar analgésicos. °Nota cambios en la visión o ve manchas delante de los ojos. °Solicite ayuda de inmediato si: °Rompe la bolsa. °Tiene contracciones regulares separadas por menos de 5 minutos. °Tiene sangrado o pequeñas pérdidas vaginales. °Tiene cólicos o dolor muy intensos en el vientre. °Tiene dificultad para respirar. °Sientes dolor en el pecho. °Se desmaya. °No ha sentido al bebé moverse durante el tiempo que le indicó el médico. °Tiene dolor, hinchazón o enrojecimiento nuevos en un brazo o una pierna o se produce un aumento de alguno de estos síntomas. °Resumen °El tercer trimestre comprende desde la semana 28 hasta la semana 40 (desde el mes 7 hasta el mes 9). Esta es la época en que el bebé en gestación crece muy rápidamente. °Durante este período, las molestias pueden aumentar a medida que usted sube   de peso y el bebé crece. °Prepárese para la llegada del bebé: asista a las clases prenatales, compre un asiento de seguridad orientado hacia atrás para llevar al bebé en auto y prepare la habitación del bebé. °Solicite ayuda de inmediato si tiene sangrado por la vagina, siente dolor en el pecho y tiene dificultad para respirar, o si no ha sentido al bebé moverse durante el tiempo que le indicó el médico. °Esta información no tiene como fin reemplazar el consejo del médico. Asegúrese de hacerle al  médico cualquier pregunta que tenga. °Document Revised: 11/29/2019 Document Reviewed: 11/29/2019 °Elsevier Patient Education © 2022 Elsevier Inc. ° °

## 2021-04-14 NOTE — Progress Notes (Signed)
Subjective:  Shannon Myers is a 35 y.o. [phone] at [redacted]w[redacted]d being seen today for ongoing prenatal care.  She is currently monitored for the following issues for this high-risk pregnancy and has Obesity; Supervision of high risk pregnancy, antepartum; History of preterm delivery, currently pregnant; Language barrier; Hematuria; Back pain during pregnancy; and Group B streptococcal bacteriuria on their problem list.  Patient reports general discomforts of pregnancy.  Contractions: Irritability. Vag. Bleeding: None.  Movement: Present. Denies leaking of fluid.   The following portions of the patient's history were reviewed and updated as appropriate: allergies, current medications, past family history, past medical history, past social history, past surgical history and problem list. Problem list updated.  Objective:   Vitals:   04/14/21 1536  BP: 115/72  Pulse: 91  Weight: 215 lb 12.8 oz (97.9 kg)    Fetal Status: Fetal Heart Rate (bpm): 139   Movement: Present     General:  Alert, oriented and cooperative. Patient is in no acute distress.  Skin: Skin is warm and dry. No rash noted.   Cardiovascular: Normal heart rate noted  Respiratory: Normal respiratory effort, no problems with respiration noted  Abdomen: Soft, gravid, appropriate for gestational age. Pain/Pressure: Present     Pelvic:  Cervical exam performed        Extremities: Normal range of motion.  Edema: Trace  Mental Status: Normal mood and affect. Normal behavior. Normal judgment and thought content.   Urinalysis:      Assessment and Plan:  Pregnancy: [id number] at [redacted]w[redacted]d  1. Supervision of high risk pregnancy, antepartum Labor precautions - GC/Chlamydia probe amp ([place])not at [facility]  Growth 48 % on 04/08/21  2. Group B streptococcal bacteriuria Treat while in labor  3. History of preterm delivery, currently pregnant Completed 17 OHP  4. Language barrier Live interrupter used during today's visit  Term labor  symptoms and general obstetric precautions including but not limited to vaginal bleeding, contractions, leaking of fluid and fetal movement were reviewed in detail with the patient. Please refer to After Visit Summary for other counseling recommendations.  Return in about 1 week (around 04/21/2021) for OB visit, face to face, any provider.   Hermina Staggers, MD

## 2021-04-15 ENCOUNTER — Ambulatory Visit: Payer: Self-pay | Admitting: *Deleted

## 2021-04-15 ENCOUNTER — Encounter: Payer: Self-pay | Admitting: *Deleted

## 2021-04-15 ENCOUNTER — Ambulatory Visit: Payer: Self-pay | Attending: Obstetrics and Gynecology

## 2021-04-15 VITALS — BP 119/55 | HR 92

## 2021-04-15 DIAGNOSIS — O0990 Supervision of high risk pregnancy, unspecified, unspecified trimester: Secondary | ICD-10-CM

## 2021-04-15 DIAGNOSIS — O09899 Supervision of other high risk pregnancies, unspecified trimester: Secondary | ICD-10-CM | POA: Insufficient documentation

## 2021-04-15 DIAGNOSIS — Z789 Other specified health status: Secondary | ICD-10-CM

## 2021-04-15 DIAGNOSIS — O09523 Supervision of elderly multigravida, third trimester: Secondary | ICD-10-CM | POA: Insufficient documentation

## 2021-04-15 DIAGNOSIS — R8271 Bacteriuria: Secondary | ICD-10-CM | POA: Insufficient documentation

## 2021-04-15 LAB — GC/CHLAMYDIA PROBE AMP (~~LOC~~) NOT AT ARMC
Chlamydia: NEGATIVE
Comment: NEGATIVE
Comment: NORMAL
Neisseria Gonorrhea: NEGATIVE

## 2021-04-21 ENCOUNTER — Other Ambulatory Visit: Payer: Self-pay

## 2021-04-21 ENCOUNTER — Ambulatory Visit (INDEPENDENT_AMBULATORY_CARE_PROVIDER_SITE_OTHER): Payer: Self-pay | Admitting: Family Medicine

## 2021-04-21 ENCOUNTER — Ambulatory Visit: Payer: Self-pay | Admitting: *Deleted

## 2021-04-21 ENCOUNTER — Ambulatory Visit (INDEPENDENT_AMBULATORY_CARE_PROVIDER_SITE_OTHER): Payer: 59

## 2021-04-21 VITALS — BP 118/75 | HR 101

## 2021-04-21 DIAGNOSIS — O99210 Obesity complicating pregnancy, unspecified trimester: Secondary | ICD-10-CM

## 2021-04-21 DIAGNOSIS — O09899 Supervision of other high risk pregnancies, unspecified trimester: Secondary | ICD-10-CM

## 2021-04-21 DIAGNOSIS — Z789 Other specified health status: Secondary | ICD-10-CM

## 2021-04-21 DIAGNOSIS — R8271 Bacteriuria: Secondary | ICD-10-CM

## 2021-04-21 DIAGNOSIS — O0990 Supervision of high risk pregnancy, unspecified, unspecified trimester: Secondary | ICD-10-CM

## 2021-04-21 NOTE — Progress Notes (Signed)

## 2021-04-21 NOTE — Patient Instructions (Signed)
Tercer trimestre de embarazo Third Trimester of Pregnancy El tercer trimestre de embarazo va desde la semana 28 hasta la semana 40. Esto corresponde a los meses 7 a 9. El tercer trimestre es un perodo en el que el beb en gestacin (feto) crece rpidamente. Hacia el final del noveno mes, el feto mide alrededor de 20pulgadas (45cm) de largo y pesa entre 6 y 10 libras (2.7 y 4.5kg). Cambios en el cuerpo durante el tercer trimestre Durante el tercer trimestre, su cuerpo contina experimentando numerosos cambios. Los cambios varan y generalmente vuelven a la normalidad despus del nacimiento del beb. Cambios fsicos Seguir aumentando de peso. Es de esperar que aumente entre 25 y 35libras (11 y 16kg) hacia el final del embarazo si inicia el embarazo con un peso normal. Si tiene bajo peso, es de esperar que aumente entre 28 y 40 libras (13 y18 kg), y si tiene sobrepeso, es de esperar que aumente entre 15 y 25 libras (7 y 11kg). Podrn aparecer las primeras estras en las caderas, el abdomen y las mamas. Las mamas seguirn creciendo y pueden doler. Un lquido amarillo (calostro) puede salir de sus pechos. Esta es la primera leche que usted produce para su beb. Tal vez haya cambios en el cabello. Esto cambios pueden incluir su engrosamiento, crecimiento rpido y cambios en la textura. A algunas personas tambin se les cae el cabello durante o despus del embarazo, o tienen el cabello seco o fino. El ombligo puede salir hacia afuera. Puede observar que se le hinchan las manos, el rostro o los tobillos. Cambios en la salud Es posible que tenga acidez estomacal. Puede sufrir estreimiento. Puede desarrollar hemorroides. Puede desarrollar venas hinchadas y abultadas (venas varicosas) en las piernas. Puede presentar ms dolor en la pelvis, la espalda o los muslos. Esto se debe al aumento de peso y al aumento de las hormonas que relajan las articulaciones. Puede presentar un aumento del hormigueo o  entumecimiento en las manos, brazos y piernas. La piel de su abdomen tambin puede sentirse entumecida. Puede sentir que le falta el Rosangelica debido a que se expande el tero. Otros cambios Puede tener necesidad de orinar con ms frecuencia porque el feto baja hacia la pelvis y ejerce presin sobre la vejiga. Puede tener ms problemas para dormir. Esto puede deberse al tamao de su abdomen, una mayor necesidad de orinar y un aumento en el metabolismo de su cuerpo. Puede notar que el feto "baja" o lo siente ms bajo, en el abdomen (aligeramiento). Puede tener un aumento de la secrecin vaginal. Puede notar que tiene dolor alrededor del hueso plvico a medida que el tero se distiende. Siga estas instrucciones en su casa: Medicamentos Siga las instrucciones del mdico en relacin con el uso de medicamentos. Durante el embarazo, hay medicamentos que pueden tomarse y otros que no. No tome ningn medicamento a menos que lo haya autorizado el mdico. Tome vitaminas prenatales que contengan por lo menos 600microgramos (mcg) de cido flico. Comida y bebida Lleve una dieta saludable que incluya frutas y verduras frescas, cereales integrales, buenas fuentes de protenas como carnes magras, huevos o tofu, y productos lcteos descremados. Evite la carne cruda y el jugo, la leche y el queso sin pasteurizar. Estos portan grmenes que pueden provocar dao tanto a usted como al beb. Tome 4 o 5 comidas pequeas en lugar de 3 comidas abundantes al da. Es posible que tenga que tomar estas medidas para prevenir o tratar el estreimiento: Beber suficiente lquido como para mantener la orina   de color amarillo plido. Consumir alimentos ricos en fibra, como frijoles, cereales integrales, y frutas y verduras frescas. Limitar el consumo de alimentos ricos en grasa y azcares procesados, como los alimentos fritos o dulces. Actividad Haga ejercicio solamente como se lo haya indicado el mdico. La mayora de las personas  pueden continuar su actividad fsica habitual durante el embarazo. Intente realizar como mnimo 30minutos de actividad fsica por lo menos 5das a la semana. Deje de hacer ejercicio si experimenta contracciones en el tero. Deje de hacer ejercicio si le aparecen dolor o clicos en la parte baja del vientre o de la espalda. Evite levantar pesos excesivos. No haga ejercicio si hace mucho calor o humedad, o si se encuentra a una altitud elevada. Si lo desea, puede seguir teniendo relaciones sexuales, salvo que el mdico le indique lo contrario. Alivio del dolor y del malestar Haga pausas frecuentes y descanse con las piernas levantadas (elevadas) si tiene calambres en las piernas o dolor en la parte baja de la espalda. Dese baos de asiento con agua tibia para aliviar el dolor o las molestias causadas por las hemorroides. Use una crema para las hemorroides si el mdico la autoriza. Use un sujetador que le brinde buen soporte para prevenir las molestias causadas por la sensibilidad en las mamas. Si tiene venas varicosas: Use medias de compresin como se lo haya indicado el mdico. Eleve los pies durante 15minutos, 3 o 4veces por da. Limite el consumo de sal en su dieta. Seguridad Hable con su mdico antes de viajar distancias largas. No se d baos de inmersin en agua caliente, baos turcos ni saunas. Use el cinturn de seguridad en todo momento mientras conduce o va en auto. Hable con el mdico si es vctima de maltrato verbal o fsico. Preparacin para el nacimiento Para prepararse para la llegada de su beb: Tome clases prenatales para entender, practicar, y hacer preguntas sobre el trabajo de parto y el parto. Visite el hospital y recorra el rea de maternidad. Compre un asiento de seguridad orientado hacia atrs, y asegrese de saber cmo instalarlo en su automvil. Prepare la habitacin o el lugar donde dormir el beb. Asegrese de quitar todas las almohadas y animales de peluche de  la cuna del beb para evitar la asfixia. Indicaciones generales Evite el contacto con las bandejas sanitarias de los gatos y la tierra que estos animales usan. Estos alimentos contienen bacterias que pueden causar defectos congnitos en el beb. Si tiene un gato, pdale a alguien que limpie la caja de arena por usted. No se haga lavados vaginales ni use tampones. No use toallas higinicas perfumadas. No consuma ningn producto que contenga nicotina o tabaco, como cigarrillos, cigarrillos electrnicos y tabaco de mascar. Si necesita ayuda para dejar de consumir estos productos, consulte al mdico. No use ningn remedio a base de hierbas, drogas ilegales o medicamentos que no le hayan sido recetados. Las sustancias qumicas de estos productos pueden daar al beb. No beba alcohol. Le realizarn exmenes prenatales ms frecuentes durante el tercer trimestre. Durante una visita prenatal de rutina, el mdico le har un examen fsico, le realizar pruebas y hablar con usted de su salud general. Cumpla con todas las visitas de seguimiento. Esto es importante. Dnde buscar ms informacin American Pregnancy Association (Asociacin Estadounidense del Embarazo): americanpregnancy.org American College of Obstetricians and Gynecologists (Colegio Estadounidense de Obstetras y Gineclogos): acog.org/womens-health/pregnancy? Office on Women's Health (Oficina para la Salud de la Mujer): womenshealth.gov/pregnancy Comunquese con un mdico si tiene: Fiebre. Clicos leves en la   pelvis, presin en la pelvis o dolor persistente en la zona abdominal o la parte baja de la espalda. Vmitos o diarrea. Secrecin vaginal con mal olor u orina con mal olor. Dolor al orinar. Un dolor de cabeza que no desaparece despus de tomar analgsicos. Cambios en la visin o ve manchas delante de los ojos. Solicite ayuda de inmediato si: Rompe la bolsa. Tiene contracciones regulares separadas por menos de 5minutos. Tiene sangrado o  pequeas prdidas vaginales. Siente un dolor abdominal intenso. Tiene dificultad para respirar. Siente dolor en el pecho. Sufre episodios de desmayo. No ha sentido a su beb moverse durante el perodo de tiempo que le indic el mdico. Tiene dolor, hinchazn o enrojecimiento nuevos en un brazo o una pierna o se produce un aumento de alguno de estos sntomas. Resumen El tercer trimestre del embarazo comprende desde la semana28 hasta la semana 40 (desde el mes7 hasta el mes9). Puede tener ms problemas para dormir. Esto puede deberse al tamao de su abdomen, una mayor necesidad de orinar y un aumento en el metabolismo de su cuerpo. Le realizarn exmenes prenatales ms frecuentes durante el tercer trimestre. Cumpla con todas las visitas de seguimiento. Esto es importante. Esta informacin no tiene como fin reemplazar el consejo del mdico. Asegrese de hacerle al mdico cualquier pregunta que tenga. Document Revised: 11/24/2019 Document Reviewed: 11/24/2019 Elsevier Patient Education  2022 Elsevier Inc.  

## 2021-04-21 NOTE — Progress Notes (Signed)
   PRENATAL VISIT NOTE  Subjective:  Shannon Myers is a 35 y.o. [phone] at [redacted]w[redacted]d being seen today for ongoing prenatal care.  She is currently monitored for the following issues for this high-risk pregnancy and has Obesity; Supervision of high risk pregnancy, antepartum; History of preterm delivery, currently pregnant; Language barrier; Hematuria; Back pain during pregnancy; and Group B streptococcal bacteriuria on their problem list.  Patient reports no complaints.  Contractions: Not present. Vag. Bleeding: None.  Movement: Present. Denies leaking of fluid.   The following portions of the patient's history were reviewed and updated as appropriate: allergies, current medications, past family history, past medical history, past social history, past surgical history and problem list.   Objective:   Vitals:   04/21/21 1329  BP: 118/75  Pulse: (!) 101    Fetal Status: Fetal Heart Rate (bpm): 138 Fundal Height: 36 cm Movement: Present  Presentation: Vertex  General:  Alert, oriented and cooperative. Patient is in no acute distress.  Skin: Skin is warm and dry. No rash noted.   Cardiovascular: Normal heart rate noted  Respiratory: Normal respiratory effort, no problems with respiration noted  Abdomen: Soft, gravid, appropriate for gestational age.  Pain/Pressure: Absent     Pelvic: Cervical exam deferred        Extremities: Normal range of motion.  Edema: Trace  Mental Status: Normal mood and affect. Normal behavior. Normal judgment and thought content.  NST:  Baseline: 130 bpm, Variability: Good {> 6 bpm), Accelerations: Reactive, and Decelerations: Absent  Assessment and Plan:  Pregnancy: [id number] at [redacted]w[redacted]d 1. Supervision of high risk pregnancy, antepartum In testing due to maternal obesity, BMI > 40 - AMBULATORY REFERRAL TO [facility]  2. History of preterm delivery, currently pregnant Term now  3. Language barrier Spanish interpreter: Raquel used  4. Group B  streptococcal bacteriuria Will need treatment in labor  Term labor symptoms and general obstetric precautions including but not limited to vaginal bleeding, contractions, leaking of fluid and fetal movement were reviewed in detail with the patient. Please refer to After Visit Summary for other counseling recommendations.   Return in 1 week (on 04/28/2021).  Future Appointments  Date Time Provider Department Center  04/21/2021  2:15 PM [facility] NST [facility]  04/30/2021  8:15 AM [facility] NST [facility]  04/30/2021  9:15 AM Crissie Reese, Mary Sella, MD [facility]    Reva Bores, MD

## 2021-04-23 ENCOUNTER — Inpatient Hospital Stay (HOSPITAL_COMMUNITY)
Admission: AD | Admit: 2021-04-23 | Discharge: 2021-04-23 | Disposition: A | Discharge status: Home / Self Care | Payer: Self-pay | Attending: Obstetrics and Gynecology | Admitting: Obstetrics and Gynecology

## 2021-04-23 ENCOUNTER — Other Ambulatory Visit: Payer: Self-pay

## 2021-04-23 ENCOUNTER — Encounter (HOSPITAL_COMMUNITY): Payer: Self-pay | Admitting: Obstetrics and Gynecology

## 2021-04-23 DIAGNOSIS — O479 False labor, unspecified: Secondary | ICD-10-CM

## 2021-04-23 DIAGNOSIS — Z3A37 37 weeks gestation of pregnancy: Secondary | ICD-10-CM | POA: Insufficient documentation

## 2021-04-23 DIAGNOSIS — O471 False labor at or after 37 completed weeks of gestation: Secondary | ICD-10-CM | POA: Insufficient documentation

## 2021-04-23 NOTE — MAU Provider Note (Incomplete)
MAU Labor Check    S: Ms. Shannon Myers is a 35 y.o. [id number] at [redacted]w[redacted]d  who presents to MAU today complaining contractions q *** minutes since ***. She denies vaginal bleeding. She denies LOF. She reports normal fetal movement.    O: LMP 08/04/2020   Cervical exam:  Dilation: 1.5 Effacement (%): 50 Cervical Position: Posterior Station: -3 Presentation: Vertex Exam by:: weston,rn   Fetal Monitoring: Baseline: 135 Variability: mod Accelerations: Several 15x15 Decelerations: None Contractions: Every 15 minutes on toco    A: SIUP at [redacted]w[redacted]d  {Blank single:19197::"Active labor","False labor"}  P: {Blank single:19197} ***  Allayne Stack, DO 04/23/2021 2:22 PM

## 2021-04-23 NOTE — Discharge Instructions (Signed)
Razones para volver a MAU/ [facility]:  1. Las contracciones son cada 5 minutos o menos, cada uno de los ltimos 1 minuto, stos han llevado a cabo durante 1-2 horas, y no se puede caminar o hablar durante ellas 2. Usted tiene un gran chorro de lquido o un goteo de lquido que no se detiene y hay que usar una toalla 3. Ha de sangrado que es de color rojo brillante, denso que el manchado - como sangrado menstrual (manchado puede ser normal en trabajo de parto prematuro o despus de una comprobacin de su cuello uterino) 4. Usted no se siente el beb se mueve como l / ella hace normalmente  

## 2021-04-23 NOTE — MAU Provider Note (Signed)
Ms. Shannon Myers is a [id number] at [redacted]w[redacted]d seen in MAU for labor. RN labor check, not seen by provider.   SVE by RN Dilation: 1.5 Effacement (%): 50 Cervical Position: Posterior Station: -3 Presentation: Vertex Exam by:: weston,rn   Cervix unchanged in 3 hours in MAU. Pt reports to RN that pain with contractions has improved from 10/10 to 5/10 pain at time of discharge.   NST - FHR: 130 bpm / moderate variability / accels present / decels absent NST reactive / TOCO: regular every 10 mins   Plan:  D/C home with labor precautions Keep scheduled appt with [facility]  Sharen Counter, CNM  04/23/2021 3:16 PM

## 2021-04-23 NOTE — MAU Note (Signed)
Dr Annia Friendly notified of second SVE and pt now reports her pain a 5/10. Will offer pt a third check due to G9P7. Pt prefers a third exam. States she lives just 10 minutes away. Pt up to void

## 2021-04-24 ENCOUNTER — Inpatient Hospital Stay (HOSPITAL_COMMUNITY)
Admission: AD | Admit: 2021-04-24 | Discharge: 2021-04-24 | Disposition: A | Discharge status: Home / Self Care | Payer: Self-pay | Attending: Obstetrics & Gynecology | Admitting: Obstetrics & Gynecology

## 2021-04-24 DIAGNOSIS — O479 False labor, unspecified: Secondary | ICD-10-CM | POA: Insufficient documentation

## 2021-04-24 NOTE — MAU Note (Signed)
Reports ctx about 10 min apart.  Denis any vag bleeding or leaking. 1.5 yesterday.

## 2021-04-24 NOTE — MAU Note (Signed)
I have communicated with F. Cres-Dischmon,CNM and reviewed vital signs:  Vitals:   04/24/21 1313 04/24/21 1505  BP: 130/77 129/75  Pulse: 98 86  Resp: 18 18  Temp: 98.6 F (37 C)     Vaginal exam:  Dilation: 1.5 Effacement (%): 50 Cervical Position: Posterior Station: -2 Presentation: Vertex Exam by:: K.Reign Dziuba,RN,   Also reviewed contraction pattern and that non-stress test is reactive.  It has been documented that patient is contracting every minutes with no cervical change over 1.5hrs hours not indicating active labor.  Patient denies any other complaints.  Based on this report provider has given order for discharge.  A discharge order and diagnosis entered by a provider.   Labor discharge instructions reviewed with patient.

## 2021-04-26 ENCOUNTER — Other Ambulatory Visit: Payer: Self-pay | Admitting: Advanced Practice Midwife

## 2021-04-30 ENCOUNTER — Ambulatory Visit (INDEPENDENT_AMBULATORY_CARE_PROVIDER_SITE_OTHER): Payer: Self-pay | Admitting: Family Medicine

## 2021-04-30 ENCOUNTER — Ambulatory Visit (INDEPENDENT_AMBULATORY_CARE_PROVIDER_SITE_OTHER): Payer: 59

## 2021-04-30 ENCOUNTER — Encounter: Payer: Self-pay | Admitting: Family Medicine

## 2021-04-30 ENCOUNTER — Other Ambulatory Visit: Payer: Self-pay

## 2021-04-30 ENCOUNTER — Ambulatory Visit (INDEPENDENT_AMBULATORY_CARE_PROVIDER_SITE_OTHER): Payer: Self-pay | Admitting: General Practice

## 2021-04-30 VITALS — BP 119/75 | HR 89 | Wt 218.0 lb

## 2021-04-30 DIAGNOSIS — O0990 Supervision of high risk pregnancy, unspecified, unspecified trimester: Secondary | ICD-10-CM

## 2021-04-30 DIAGNOSIS — R8271 Bacteriuria: Secondary | ICD-10-CM

## 2021-04-30 DIAGNOSIS — O99210 Obesity complicating pregnancy, unspecified trimester: Secondary | ICD-10-CM | POA: Diagnosis not present

## 2021-04-30 DIAGNOSIS — Z789 Other specified health status: Secondary | ICD-10-CM

## 2021-04-30 DIAGNOSIS — Z6841 Body Mass Index (BMI) 40.0 and over, adult: Secondary | ICD-10-CM

## 2021-04-30 NOTE — Progress Notes (Signed)
Pt informed that the ultrasound is considered a limited OB ultrasound and is not intended to be a complete ultrasound exam.  Patient also informed that the ultrasound is not being completed with the intent of assessing for fetal or placental anomalies or any pelvic abnormalities.  Explained that the purpose of today's ultrasound is to assess for  BPP, presentation, and AFI.  Patient acknowledges the purpose of the exam and the limitations of the study.     Chase Caller RN BSN 04/30/21

## 2021-04-30 NOTE — Progress Notes (Signed)
   Subjective:  Shannon Myers is a 35 y.o. [phone] at [redacted]w[redacted]d being seen today for ongoing prenatal care.  She is currently monitored for the following issues for this low-risk pregnancy and has Obesity; Supervision of high risk pregnancy, antepartum; History of preterm delivery, currently pregnant; Language barrier; Hematuria; Back pain during pregnancy; and Group B streptococcal bacteriuria on their problem list.  Patient reports no complaints.  Contractions: Not present. Vag. Bleeding: None.  Movement: Present. Denies leaking of fluid.   The following portions of the patient's history were reviewed and updated as appropriate: allergies, current medications, past family history, past medical history, past social history, past surgical history and problem list. Problem list updated.  Objective:   Vitals:   04/30/21 0921  BP: 119/75  Pulse: 89  Weight: 218 lb (98.9 kg)    Fetal Status: Fetal Heart Rate (bpm): NST   Movement: Present     General:  Alert, oriented and cooperative. Patient is in no acute distress.  Skin: Skin is warm and dry. No rash noted.   Cardiovascular: Normal heart rate noted  Respiratory: Normal respiratory effort, no problems with respiration noted  Abdomen: Soft, gravid, appropriate for gestational age. Pain/Pressure: Present     Pelvic: Vag. Bleeding: None     Cervical exam deferred        Extremities: Normal range of motion.  Edema: None  Mental Status: Normal mood and affect. Normal behavior. Normal judgment and thought content.   Urinalysis:      Assessment and Plan:  Pregnancy: [id number] at [redacted]w[redacted]d  1. Supervision of high risk pregnancy, antepartum BP and FHR normal Has IOL scheduled for 39 weeks and orders already placed  2. Class 3 severe obesity without serious comorbidity with body mass index (BMI) of 40.0 to 44.9 in adult, unspecified obesity type (HCC) Antenatal testing started at 36 weeks BPP 8/10, -2 for breathing  3. Language  barrier Spanish  4. Group B streptococcal bacteriuria Treatment in labor  5. Grand multiparity Plan for TXA +/- prophylactic uterotonics at time of delivery  Term labor symptoms and general obstetric precautions including but not limited to vaginal bleeding, contractions, leaking of fluid and fetal movement were reviewed in detail with the patient. Please refer to After Visit Summary for other counseling recommendations.  Return in 1 week (on 05/07/2021) for [facility], ob visit.   Venora Maples, MD

## 2021-05-02 ENCOUNTER — Inpatient Hospital Stay (EMERGENCY_DEPARTMENT_HOSPITAL)
Admission: AD | Admit: 2021-05-02 | Discharge: 2021-05-02 | Disposition: A | Discharge status: Home / Self Care | Payer: 59 | Source: Home / Self Care | Attending: Obstetrics and Gynecology | Admitting: Obstetrics and Gynecology

## 2021-05-02 ENCOUNTER — Other Ambulatory Visit: Payer: Self-pay

## 2021-05-02 ENCOUNTER — Encounter (HOSPITAL_COMMUNITY): Payer: Self-pay | Admitting: Obstetrics and Gynecology

## 2021-05-02 DIAGNOSIS — O479 False labor, unspecified: Secondary | ICD-10-CM | POA: Insufficient documentation

## 2021-05-02 DIAGNOSIS — O09899 Supervision of other high risk pregnancies, unspecified trimester: Secondary | ICD-10-CM

## 2021-05-02 DIAGNOSIS — O09219 Supervision of pregnancy with history of pre-term labor, unspecified trimester: Secondary | ICD-10-CM | POA: Insufficient documentation

## 2021-05-02 DIAGNOSIS — O0990 Supervision of high risk pregnancy, unspecified, unspecified trimester: Secondary | ICD-10-CM

## 2021-05-02 DIAGNOSIS — Z789 Other specified health status: Secondary | ICD-10-CM

## 2021-05-02 DIAGNOSIS — R8271 Bacteriuria: Secondary | ICD-10-CM

## 2021-05-02 NOTE — MAU Note (Signed)
Having a lot of pain in her pelvis. Started having pains 2 days ago, coming and going.  Today having a lot of pain, feels like labor. No bleeding or leaking.

## 2021-05-02 NOTE — MAU Provider Note (Addendum)
S: Patient is here for RN labor evaluation. Fetal tracing, vital signs, & chart reviewed   O:  Vitals:   05/02/21 1752  BP: 129/88  Pulse: 100  Resp: 20  Temp: 98.1 F (36.7 C)  TempSrc: Oral  SpO2: 100%  Weight: 99.5 kg  Height: 4\' 11"  (1.499 m)   No results found for this or any previous visit (from the past 24 hour(s)).  Dilation: 1.5 Effacement (%): 50 Cervical Position: Posterior Station: -3 Presentation: Vertex Exam by:: [email or web address], RN   FHR: 145 bpm, Mod Var, no Decels, + Accels UC: Irregular   A: 1. Supervision of high risk pregnancy, antepartum   2. History of preterm delivery, currently pregnant   3. Language barrier   4. Group B streptococcal bacteriuria   5. False labor   Cervical exam unchanged from MAU visit on 11/24. Not contracting on monitor or subjectively. Patient initially signed in with CC of water broken however upon being in room felt she had discharge but no signs of rupture.   P:  RN to discharge home in stable condition with return precautions & fetal kick counts  12/24, MD 05/02/21 6:55 PM  GME ATTESTATION:  I saw and evaluated the patient. I agree with the findings and the plan of care as documented in the resident's note and have made all necessary edits.  14/02/22, MD, MPH OB Fellow, Faculty Practice [facility], Center for [facility] 05/02/2021 7:31 PM

## 2021-05-02 NOTE — MAU Note (Signed)
Patient does not think her water is broken. Reports some vaginal discharge.

## 2021-05-04 ENCOUNTER — Inpatient Hospital Stay (HOSPITAL_COMMUNITY): Payer: 59 | Attending: Obstetrics and Gynecology

## 2021-05-05 ENCOUNTER — Other Ambulatory Visit: Payer: Self-pay

## 2021-05-05 ENCOUNTER — Inpatient Hospital Stay (HOSPITAL_COMMUNITY): Admission: AD | Admit: 2021-05-05 | Payer: Self-pay | Admitting: Obstetrics and Gynecology

## 2021-05-05 ENCOUNTER — Inpatient Hospital Stay (HOSPITAL_COMMUNITY)
Admission: AD | Admit: 2021-05-05 | Discharge: 2021-05-07 | DRG: 807 | Disposition: A | Discharge status: Home / Self Care | Payer: 59 | Attending: Obstetrics and Gynecology | Admitting: Obstetrics and Gynecology

## 2021-05-05 ENCOUNTER — Encounter (HOSPITAL_COMMUNITY): Payer: Self-pay | Admitting: Family Medicine

## 2021-05-05 ENCOUNTER — Telehealth: Payer: Self-pay | Admitting: Family Medicine

## 2021-05-05 DIAGNOSIS — O99824 Streptococcus B carrier state complicating childbirth: Secondary | ICD-10-CM | POA: Diagnosis present

## 2021-05-05 DIAGNOSIS — O99210 Obesity complicating pregnancy, unspecified trimester: Secondary | ICD-10-CM | POA: Diagnosis present

## 2021-05-05 DIAGNOSIS — M549 Dorsalgia, unspecified: Secondary | ICD-10-CM | POA: Diagnosis present

## 2021-05-05 DIAGNOSIS — R8271 Bacteriuria: Secondary | ICD-10-CM | POA: Diagnosis present

## 2021-05-05 DIAGNOSIS — Z20822 Contact with and (suspected) exposure to covid-19: Secondary | ICD-10-CM | POA: Diagnosis present

## 2021-05-05 DIAGNOSIS — Z603 Acculturation difficulty: Secondary | ICD-10-CM | POA: Diagnosis present

## 2021-05-05 DIAGNOSIS — O99891 Other specified diseases and conditions complicating pregnancy: Secondary | ICD-10-CM | POA: Diagnosis present

## 2021-05-05 DIAGNOSIS — O09899 Supervision of other high risk pregnancies, unspecified trimester: Secondary | ICD-10-CM

## 2021-05-05 DIAGNOSIS — Z3A39 39 weeks gestation of pregnancy: Secondary | ICD-10-CM

## 2021-05-05 DIAGNOSIS — O0990 Supervision of high risk pregnancy, unspecified, unspecified trimester: Secondary | ICD-10-CM

## 2021-05-05 DIAGNOSIS — O26893 Other specified pregnancy related conditions, third trimester: Secondary | ICD-10-CM | POA: Diagnosis present

## 2021-05-05 DIAGNOSIS — E669 Obesity, unspecified: Secondary | ICD-10-CM | POA: Diagnosis present

## 2021-05-05 DIAGNOSIS — O99214 Obesity complicating childbirth: Secondary | ICD-10-CM | POA: Diagnosis present

## 2021-05-05 DIAGNOSIS — Z8751 Personal history of pre-term labor: Secondary | ICD-10-CM

## 2021-05-05 DIAGNOSIS — O99820 Streptococcus B carrier state complicating pregnancy: Secondary | ICD-10-CM | POA: Diagnosis not present

## 2021-05-05 DIAGNOSIS — Z789 Other specified health status: Secondary | ICD-10-CM | POA: Diagnosis present

## 2021-05-05 LAB — CBC
HCT: 34.3 % — ABNORMAL LOW (ref 36.0–46.0)
Hemoglobin: 11.6 g/dL — ABNORMAL LOW (ref 12.0–15.0)
MCH: 27.8 pg (ref 26.0–34.0)
MCHC: 33.8 g/dL (ref 30.0–36.0)
MCV: 82.3 fL (ref 80.0–100.0)
Platelets: 195 10*3/uL (ref 150–400)
RBC: 4.17 MIL/uL (ref 3.87–5.11)
RDW: 14.3 % (ref 11.5–15.5)
WBC: 8.7 10*3/uL (ref 4.0–10.5)
nRBC: 0 % (ref 0.0–0.2)

## 2021-05-05 LAB — TYPE AND SCREEN
ABO/RH(D): O POS
Antibody Screen: NEGATIVE

## 2021-05-05 LAB — RESP PANEL BY RT-PCR (FLU A&B, COVID) ARPGX2
Influenza A by PCR: NEGATIVE
Influenza B by PCR: NEGATIVE
SARS Coronavirus 2 by RT PCR: NEGATIVE

## 2021-05-05 MED ORDER — OXYCODONE-ACETAMINOPHEN 5-325 MG PO TABS: 2.0000 | ORAL_TABLET | ORAL | Status: DC | PRN

## 2021-05-05 MED ORDER — SODIUM CHLORIDE 0.9 % IV SOLN
5.0000 10*6.[IU] | Freq: Once | INTRAVENOUS | Status: AC
  Administered 2021-05-05: 5 10*6.[IU] via INTRAVENOUS
  Filled 2021-05-05: qty 5

## 2021-05-05 MED ORDER — ACETAMINOPHEN 325 MG PO TABS: 650.0000 mg | ORAL_TABLET | ORAL | Status: DC | PRN

## 2021-05-05 MED ORDER — ONDANSETRON HCL 4 MG/2ML IJ SOLN: 4.0000 mg | Freq: Four times a day (QID) | INTRAMUSCULAR | Status: DC | PRN

## 2021-05-05 MED ORDER — TERBUTALINE SULFATE 1 MG/ML IJ SOLN: 0.2500 mg | Freq: Once | INTRAMUSCULAR | Status: DC | PRN

## 2021-05-05 MED ORDER — MISOPROSTOL 25 MCG QUARTER TABLET
25.0000 ug | ORAL_TABLET | ORAL | Status: DC | PRN
  Filled 2021-05-05: qty 1

## 2021-05-05 MED ORDER — FLEET ENEMA 7-19 GM/118ML RE ENEM: 1.0000 | ENEMA | RECTAL | Status: DC | PRN

## 2021-05-05 MED ORDER — LACTATED RINGERS IV SOLN: INTRAVENOUS | Status: DC

## 2021-05-05 MED ORDER — PENICILLIN G POT IN DEXTROSE 60000 UNIT/ML IV SOLN
3.0000 10*6.[IU] | INTRAVENOUS | Status: DC
  Administered 2021-05-06 (×2): 3 10*6.[IU] via INTRAVENOUS
  Filled 2021-05-05 (×2): qty 50

## 2021-05-05 MED ORDER — LACTATED RINGERS IV SOLN: 500.0000 mL | INTRAVENOUS | Status: DC | PRN

## 2021-05-05 MED ORDER — OXYCODONE-ACETAMINOPHEN 5-325 MG PO TABS: 1.0000 | ORAL_TABLET | ORAL | Status: DC | PRN

## 2021-05-05 MED ORDER — MISOPROSTOL 50MCG HALF TABLET
50.0000 ug | ORAL_TABLET | ORAL | Status: DC | PRN
  Administered 2021-05-05: 50 ug via ORAL
  Filled 2021-05-05: qty 1

## 2021-05-05 MED ORDER — LIDOCAINE HCL (PF) 1 % IJ SOLN: 30.0000 mL | INTRAMUSCULAR | Status: DC | PRN

## 2021-05-05 MED ORDER — OXYTOCIN-SODIUM CHLORIDE 30-0.9 UT/500ML-% IV SOLN
2.5000 [IU]/h | INTRAVENOUS | Status: DC
  Filled 2021-05-05: qty 500

## 2021-05-05 MED ORDER — OXYTOCIN BOLUS FROM INFUSION
333.0000 mL | Freq: Once | INTRAVENOUS | Status: AC
  Administered 2021-05-06: 333 mL via INTRAVENOUS

## 2021-05-05 MED ORDER — SOD CITRATE-CITRIC ACID 500-334 MG/5ML PO SOLN: 30.0000 mL | ORAL | Status: DC | PRN

## 2021-05-05 NOTE — Telephone Encounter (Signed)
Patient was scheduled for INDUCTION yesterday 05/04/2021, she said she received a call not to come in because they didn't have the injection she need, she want to know if the hospital will her her to get this rescheduled?

## 2021-05-05 NOTE — Plan of Care (Signed)
  Problem: Education: Goal: Knowledge of Childbirth will improve Outcome: Progressing Goal: Ability to make informed decisions regarding treatment and plan of care will improve Outcome: Progressing Goal: Ability to state and carry out methods to decrease the pain will improve Outcome: Progressing   

## 2021-05-05 NOTE — H&P (Addendum)
OBSTETRIC ADMISSION HISTORY AND PHYSICAL  Shannon Myers is a 35 y.o. female [phone] with IUP at [redacted]w[redacted]d by LMP c/w [place] presenting for elective IOL. She reports +FMs, No LOF, no VB, no blurry vision, headaches or peripheral edema, nor RUQ pain.  She plans on breast and bottle feeding. She request Nexplanon for birth control. She received her prenatal care at [facility] and also received weekly Makena for hx of preterm delivery.  Dating: By LMP c/w [place] --->  Estimated Date of Delivery: 05/11/21  Sono:   @[redacted]w[redacted]d , CWD, normal anatomy, cephalic presentation, [id number], 48% EFW  Prenatal History/Complications:  Obesity complicating pregnancy and advanced maternal age. GBS bacteriuria   Past Medical History: Past Medical History:  Diagnosis Date   Anemia    Preterm labor     Past Surgical History: Past Surgical History:  Procedure Laterality Date   NO PAST SURGERIES      Obstetrical History: OB History     Gravida  9   Para  7   Term  6   Preterm  1   AB  1   Living  7      SAB  1   IAB      Ectopic      Multiple  0   Live Births  7           Social History Social History   Socioeconomic History   Marital status: Married    Spouse name: Not on file   Number of children: Not on file   Years of education: Not on file   Highest education level: Not on file  Occupational History   Not on file  Tobacco Use   Smoking status: Never   Smokeless tobacco: Never  Vaping Use   Vaping Use: Never used  Substance and Sexual Activity   Alcohol use: No   Drug use: No   Sexual activity: Yes    Birth control/protection: None  Other Topics Concern   Not on file  Social History Narrative   Not on file   Social Determinants of Health   Financial Resource Strain: Not on file  Food Insecurity: No Food Insecurity   Worried About Running Out of Food in the Last Year: Never true   Ran Out of Food in the Last Year: Never true  Transportation Needs: Unmet Transportation  Needs   Lack of Transportation (Medical): Yes   Lack of Transportation (Non-Medical): No  Physical Activity: Not on file  Stress: Not on file  Social Connections: Not on file    Family History: Family History  Problem Relation Age of Onset   Diabetes Mother    Hypertension Mother    Healthy Father    Alcohol abuse Neg Hx    Asthma Neg Hx    Arthritis Neg Hx    Birth defects Neg Hx    Cancer Neg Hx    COPD Neg Hx    Depression Neg Hx    Drug abuse Neg Hx    Early death Neg Hx    Hearing loss Neg Hx    Heart disease Neg Hx    Hyperlipidemia Neg Hx    Kidney disease Neg Hx    Learning disabilities Neg Hx    Mental illness Neg Hx    Mental retardation Neg Hx    Miscarriages / Stillbirths Neg Hx    Stroke Neg Hx    Vision loss Neg Hx     Allergies: No Known Allergies  Medications Prior to Admission  Medication Sig Dispense Refill Last Dose   acetaminophen (TYLENOL) 325 MG tablet Take 650 mg by mouth every 6 (six) hours as needed. (Patient not taking: Reported on 04/30/2021)      Prenatal Vit-Fe Fumarate-FA (PRENATAL MULTIVITAMIN) TABS tablet Take 1 tablet by mouth daily at 12 noon.        Review of Systems   All systems reviewed and negative except as stated in HPI  Blood pressure 132/88, pulse 86, temperature 98.5 F (36.9 C), temperature source Oral, resp. rate 17, height 4\' 11"  (1.499 m), weight 221 lb (100.2 kg), last menstrual period 08/04/2020, unknown if currently breastfeeding. General appearance: alert and cooperative Lungs: clear to auscultation bilaterally Heart: regular rate and rhythm Abdomen: soft, non-tender; bowel sounds normal Extremities: Homans sign is negative, no sign of DVT Presentation: cephalic Fetal monitoring: baseline HR around 150 with good accels. Uterine activityNone Dilation: 3 Effacement (%): 60 Station: -2 Exam by:: Glenice Bow, rnc   Nursing Staff Provider  Office Location  [facility] Dating  LMP c/w [place]  Language    Spanish Anatomy US  normal  Flu Vaccine  Given 02/25/21 Genetic/Carrier Screen  NIPS:   Not done AFP: WNL   Horizon: not done  TDaP Vaccine   given 02/25/21 Hgb A1C or  GTT Early - normal Third trimester - normal  COVID Vaccine    LAB RESULTS   Rhogam  n/a Blood Type O/Positive/-- (05/16 1107)   Baby Feeding Plan Both, Breast&Bottle Antibody Negative (05/16 1107)  Contraception OCP Rubella 1.55 (05/16 1056)  Circumcision If boy, No RPR Non Reactive (09/08 0942)   Pediatrician  [facility] HBsAg Negative (05/16 1056)   Support Person FOB HCVAb neg  Prenatal Classes  HIV Non Reactive (09/08 0942)     BTL Consent  GBS  Positive in urine (03/17/21)  VBAC Consent  Pap Normal 09/02/20       BP Cuff  Waterbirth  [ ]  Class [ ]  Consent [ ]  CNM visit  PHQ9 & GAD7 [  ] new OB [  ] 28 weeks  [  ] 36 weeks Induction  [ ]  Orders Entered [ ] Foley Y/N    Prenatal labs: ABO, Rh: --/--/O POS (12/05 2201) Antibody: NEG (12/05 2201) Rubella: 1.55 (05/16 1056) RPR: Non Reactive (09/08 0942)  HBsAg: Negative (05/16 1056)  HIV: Non Reactive (09/08 0942)  GBS: Positive/-- (10/17 0000)   Prenatal Transfer Tool  Maternal Diabetes: No Genetic Screening: AFP was WNL, no other genetic screens.  Maternal Ultrasounds/Referrals: Normal Fetal Ultrasounds or other Referrals:  Referred to Materal Fetal Medicine  Maternal Substance Abuse:  No Significant Maternal Medications:   Significant Maternal Lab Results: Group B Strep positive  Results for orders placed or performed during the hospital encounter of 05/05/21 (from the past 24 hour(s))  CBC   Collection Time: 05/05/21 10:01 PM  Result Value Ref Range   WBC 8.7 4.0 - 10.5 K/uL   RBC 4.17 3.87 - 5.11 MIL/uL   Hemoglobin 11.6 (L) 12.0 - 15.0 g/dL   HCT 34.3 (L) 36.0 - 46.0 %   MCV 82.3 80.0 - 100.0 fL   MCH 27.8 26.0 - 34.0 pg   MCHC 33.8 30.0 - 36.0 g/dL   RDW 14.3 11.5 - 15.5 %   Platelets 195 150 - 400 K/uL   nRBC 0.0 0.0 - 0.2 %  Type and  screen   Collection Time: 05/05/21 10:01 PM  Result Value Ref Range  ABO/RH(D) O POS    Antibody Screen NEG    Sample Expiration      05/08/2021,2359 Performed at [facility], [address], [place], [place] [address]   Resp Panel by RT-PCR (Flu A&B, Covid) Nasopharyngeal Swab   Collection Time: 05/05/21 10:22 PM   Specimen: Nasopharyngeal Swab; Nasopharyngeal(NP) swabs in vial transport medium  Result Value Ref Range   SARS Coronavirus 2 by RT PCR NEGATIVE NEGATIVE   Influenza A by PCR NEGATIVE NEGATIVE   Influenza B by PCR NEGATIVE NEGATIVE    Patient Active Problem List   Diagnosis Date Noted   Obesity in pregnancy, antepartum 05/05/2021   Group B streptococcal bacteriuria 03/23/2021   Hematuria 12/09/2020   Back pain during pregnancy 12/09/2020   Supervision of high risk pregnancy, antepartum 10/08/2020   History of preterm delivery, currently pregnant 10/08/2020   Language barrier 10/08/2020   Obesity 01/19/2013    Assessment/Plan:  Chanta Bauers is a 35 y.o. [id number] at [redacted]w[redacted]d here for elective IOL  #Labor: Cervix 2cm and 50-60% effaced, will start with Cytotec. #Pain: Epidural  #FWB: Cat 1 #ID:  GBS+ bacteriuria, PCN started #MOF: Both #MOC: Nexplanon #Circ:  N/A Female baby.  #Grand multiparity  Plan for Txa +/- prophylactic uterotonics at time of delivery for grand multiparity.   Marcelline Mates MS3  GME ATTESTATION:  I saw and evaluated the patient. I agree with the findings and the plan of care as documented in the student's note.  Alric Seton, MD OB Fellow, Faculty Practice [facility], Center for [facility]

## 2021-05-05 NOTE — Telephone Encounter (Signed)
Call placed back to pt with interpreter Raquel. Spoke with pt after speaking with Dr Crissie Reese. Dr Crissie Reese is at hospital today as first attending. Pt advised IOL was bumped due to staffing and will be called when next available spot opens due to elective IOL. Pt verbalized understanding. Pt also advised if has any regular ctx, ROM or bleeding to go to hospital before they call back. Pt agreeable.   Judeth Cornfield, RN

## 2021-05-06 ENCOUNTER — Encounter (HOSPITAL_COMMUNITY): Payer: Self-pay | Admitting: Family Medicine

## 2021-05-06 ENCOUNTER — Inpatient Hospital Stay (HOSPITAL_COMMUNITY): Payer: 59 | Admitting: Anesthesiology

## 2021-05-06 DIAGNOSIS — O99820 Streptococcus B carrier state complicating pregnancy: Secondary | ICD-10-CM

## 2021-05-06 DIAGNOSIS — Z3A39 39 weeks gestation of pregnancy: Secondary | ICD-10-CM

## 2021-05-06 LAB — RPR: RPR Ser Ql: NONREACTIVE

## 2021-05-06 MED ORDER — FENTANYL-BUPIVACAINE-NACL 0.5-0.125-0.9 MG/250ML-% EP SOLN
12.0000 mL/h | EPIDURAL | Status: DC | PRN
  Administered 2021-05-06: 12 mL/h via EPIDURAL
  Filled 2021-05-06: qty 250

## 2021-05-06 MED ORDER — TETANUS-DIPHTH-ACELL PERTUSSIS 5-2.5-18.5 LF-MCG/0.5 IM SUSY: 0.5000 mL | PREFILLED_SYRINGE | Freq: Once | INTRAMUSCULAR | Status: DC

## 2021-05-06 MED ORDER — ONDANSETRON HCL 4 MG PO TABS: 4.0000 mg | ORAL_TABLET | ORAL | Status: DC | PRN

## 2021-05-06 MED ORDER — EPHEDRINE 5 MG/ML INJ: 10.0000 mg | INTRAVENOUS | Status: DC | PRN

## 2021-05-06 MED ORDER — DIPHENHYDRAMINE HCL 25 MG PO CAPS: 25.0000 mg | ORAL_CAPSULE | Freq: Four times a day (QID) | ORAL | Status: DC | PRN

## 2021-05-06 MED ORDER — SENNOSIDES-DOCUSATE SODIUM 8.6-50 MG PO TABS
2.0000 | ORAL_TABLET | Freq: Every day | ORAL | Status: DC
  Administered 2021-05-07: 2 via ORAL
  Filled 2021-05-06: qty 2

## 2021-05-06 MED ORDER — LACTATED RINGERS IV SOLN: 500.0000 mL | Freq: Once | INTRAVENOUS | Status: DC

## 2021-05-06 MED ORDER — PHENYLEPHRINE 40 MCG/ML (10ML) SYRINGE FOR IV PUSH (FOR BLOOD PRESSURE SUPPORT): 80.0000 ug | PREFILLED_SYRINGE | INTRAVENOUS | Status: DC | PRN

## 2021-05-06 MED ORDER — WITCH HAZEL-GLYCERIN EX PADS: 1.0000 "application " | MEDICATED_PAD | CUTANEOUS | Status: DC | PRN

## 2021-05-06 MED ORDER — ONDANSETRON HCL 4 MG/2ML IJ SOLN: 4.0000 mg | INTRAMUSCULAR | Status: DC | PRN

## 2021-05-06 MED ORDER — LIDOCAINE HCL (PF) 1 % IJ SOLN
INTRAMUSCULAR | Status: DC | PRN
  Administered 2021-05-06 (×2): 4 mL via EPIDURAL

## 2021-05-06 MED ORDER — FENTANYL CITRATE (PF) 100 MCG/2ML IJ SOLN
100.0000 ug | INTRAMUSCULAR | Status: DC | PRN
  Administered 2021-05-06 (×3): 100 ug via INTRAVENOUS
  Filled 2021-05-06 (×3): qty 2

## 2021-05-06 MED ORDER — TRANEXAMIC ACID-NACL 1000-0.7 MG/100ML-% IV SOLN
INTRAVENOUS | Status: AC
  Administered 2021-05-06: 1000 mg
  Filled 2021-05-06: qty 100

## 2021-05-06 MED ORDER — MEASLES, MUMPS & RUBELLA VAC IJ SOLR: 0.5000 mL | Freq: Once | INTRAMUSCULAR | Status: DC

## 2021-05-06 MED ORDER — ACETAMINOPHEN 325 MG PO TABS: 650.0000 mg | ORAL_TABLET | ORAL | Status: DC | PRN

## 2021-05-06 MED ORDER — COCONUT OIL OIL: 1.0000 "application " | TOPICAL_OIL | Status: DC | PRN

## 2021-05-06 MED ORDER — IBUPROFEN 600 MG PO TABS
600.0000 mg | ORAL_TABLET | Freq: Four times a day (QID) | ORAL | Status: DC
  Administered 2021-05-06 – 2021-05-07 (×5): 600 mg via ORAL
  Filled 2021-05-06 (×5): qty 1

## 2021-05-06 MED ORDER — MEDROXYPROGESTERONE ACETATE 150 MG/ML IM SUSP: 150.0000 mg | INTRAMUSCULAR | Status: DC | PRN

## 2021-05-06 MED ORDER — SIMETHICONE 80 MG PO CHEW: 80.0000 mg | CHEWABLE_TABLET | ORAL | Status: DC | PRN

## 2021-05-06 MED ORDER — DIBUCAINE (PERIANAL) 1 % EX OINT: 1.0000 "application " | TOPICAL_OINTMENT | CUTANEOUS | Status: DC | PRN

## 2021-05-06 MED ORDER — PRENATAL MULTIVITAMIN CH
1.0000 | ORAL_TABLET | Freq: Every day | ORAL | Status: DC
  Administered 2021-05-06 – 2021-05-07 (×2): 1 via ORAL
  Filled 2021-05-06 (×2): qty 1

## 2021-05-06 MED ORDER — DIPHENHYDRAMINE HCL 50 MG/ML IJ SOLN: 12.5000 mg | INTRAMUSCULAR | Status: DC | PRN

## 2021-05-06 MED ORDER — BENZOCAINE-MENTHOL 20-0.5 % EX AERO
1.0000 "application " | INHALATION_SPRAY | CUTANEOUS | Status: DC | PRN
  Administered 2021-05-06: 1 via TOPICAL
  Filled 2021-05-06: qty 56

## 2021-05-06 MED ORDER — PHENYLEPHRINE 40 MCG/ML (10ML) SYRINGE FOR IV PUSH (FOR BLOOD PRESSURE SUPPORT)
80.0000 ug | PREFILLED_SYRINGE | INTRAVENOUS | Status: DC | PRN
  Filled 2021-05-06: qty 10

## 2021-05-06 MED ORDER — TRANEXAMIC ACID-NACL 1000-0.7 MG/100ML-% IV SOLN: 1000.0000 mg | INTRAVENOUS | Status: DC

## 2021-05-06 NOTE — Plan of Care (Signed)

## 2021-05-06 NOTE — Plan of Care (Signed)
Jazziel Fitzsimmons, RN 

## 2021-05-06 NOTE — Lactation Note (Addendum)
This note was copied from a baby's chart. Lactation Consultation Note  Patient Name: Shannon Myers [id number] Date: 05/06/2021 Reason for consult: L&D Initial assessment;Term;Breastfeeding assistance;Other (Comment) (Spanish online interpreter - Sarahi# [phone]) Age:LC LD visit 45 mins PP .  Baby STS with mom rooting and fussy, LC offered to assist to latch and baby fed for 13 mins with swallows/ laid back.  Per mom had BF her prior to [facility] coming in.  Latch score 8  LC changed the 1st wet diaper as baby was feeding. ( Large )     Maternal Data Has patient been taught Hand Expression?: Yes (several small drops /) Does the patient have breastfeeding experience prior to this delivery?: Yes How long did the patient breastfeed?: per mom 2 of her babies 4-5 months / the others got use to the bottle and preferred it.  Feeding Mother's Current Feeding Choice: Breast Milk and Formula  LATCH Score Latch: Grasps breast easily, tongue down, lips flanged, rhythmical sucking.  Audible Swallowing: A few with stimulation  Type of Nipple: Everted at rest and after stimulation  Comfort (Breast/Nipple): Soft / non-tender  Hold (Positioning): Assistance needed to correctly position infant at breast and maintain latch.  LATCH Score: 8   Lactation Tools Discussed/Used    Interventions Interventions: Breast feeding basics reviewed;Assisted with latch;Skin to skin;Breast massage;Hand express;Breast compression;Adjust position;Education  Discharge    Consult Status Consult Status: Follow-up from L&D Date: 05/06/21 Follow-up type: In-patient    Matilde Sprang Taysen Bushart 05/06/2021, 9:19 AM

## 2021-05-06 NOTE — Anesthesia Preprocedure Evaluation (Signed)
Anesthesia Evaluation  Patient identified by MRN, date of birth, ID band Patient awake    Reviewed: Allergy & Precautions, Patient's Chart, lab work & pertinent test results  History of Anesthesia Complications Negative for: history of anesthetic complications  Airway Mallampati: II  TM Distance: >3 FB Neck ROM: Full    Dental no notable dental hx.    Pulmonary neg pulmonary ROS,    Pulmonary exam normal        Cardiovascular negative cardio ROS Normal cardiovascular exam     Neuro/Psych negative neurological ROS  negative psych ROS   GI/Hepatic negative GI ROS, Neg liver ROS,   Endo/Other  Morbid obesity  Renal/GU negative Renal ROS  negative genitourinary   Musculoskeletal negative musculoskeletal ROS (+)   Abdominal   Peds  Hematology negative hematology ROS (+)   Anesthesia Other Findings Day of surgery medications reviewed with patient.  Reproductive/Obstetrics (+) Pregnancy                             Anesthesia Physical Anesthesia Plan  ASA: 3  Anesthesia Plan: Epidural   Post-op Pain Management:    Induction:   PONV Risk Score and Plan: Treatment may vary due to age or medical condition  Airway Management Planned: Natural Airway  Additional Equipment: Fetal Monitoring  Intra-op Plan:   Post-operative Plan:   Informed Consent: I have reviewed the patients History and Physical, chart, labs and discussed the procedure including the risks, benefits and alternatives for the proposed anesthesia with the patient or authorized representative who has indicated his/her understanding and acceptance.       Plan Discussed with:   Anesthesia Plan Comments:         Anesthesia Quick Evaluation

## 2021-05-06 NOTE — Anesthesia Procedure Notes (Signed)
Epidural Patient location during procedure: OB Start time: 05/06/2021 8:05 AM End time: 05/06/2021 8:08 AM  Staffing Anesthesiologist: Kaylyn Layer, MD Performed: anesthesiologist   Preanesthetic Checklist Completed: patient identified, IV checked, risks and benefits discussed, monitors and equipment checked, pre-op evaluation and timeout performed  Epidural Patient position: sitting Prep: DuraPrep and site prepped and draped Patient monitoring: continuous pulse ox, blood pressure and heart rate Approach: midline Location: L3-L4 Injection technique: LOR air  Needle:  Needle type: Tuohy  Needle gauge: 17 G Needle length: 9 cm Needle insertion depth: 5 cm Catheter type: closed end flexible Catheter size: 19 Gauge Catheter at skin depth: 10 cm Test dose: negative and Other (1% lidocaine)  Assessment Events: blood not aspirated, injection not painful, no injection resistance, no paresthesia and negative IV test  Additional Notes Patient identified. Risks, benefits, and alternatives discussed with patient including but not limited to bleeding, infection, nerve damage, paralysis, failed block, incomplete pain control, headache, blood pressure changes, nausea, vomiting, reactions to medication, itching, and postpartum back pain. Confirmed with bedside nurse the patient's most recent platelet count. Confirmed with patient that they are not currently taking any anticoagulation, have any bleeding history, or any family history of bleeding disorders. Patient expressed understanding and wished to proceed. All questions were answered. Sterile technique was used throughout the entire procedure. Please see nursing notes for vital signs.   Crisp LOR on first pass. Test dose was given through epidural catheter and negative prior to continuing to dose epidural or start infusion. Warning signs of high block given to the patient including shortness of breath, tingling/numbness in hands, complete  motor block, or any concerning symptoms with instructions to call for help. Patient was given instructions on fall risk and not to get out of bed. All questions and concerns addressed with instructions to call with any issues or inadequate analgesia.  Reason for block:procedure for pain

## 2021-05-06 NOTE — Discharge Summary (Signed)
Postpartum Discharge Summary      Patient Name: Shannon Myers DOB: 1986/01/25 MRN: [id number]  Date of admission: 05/05/2021 Delivery date:05/06/2021  Delivering provider: Renard Matter  Date of discharge: 05/07/2021  Admitting diagnosis: Obesity in pregnancy, antepartum [O99.210] Intrauterine pregnancy: [redacted]w[redacted]d    Secondary diagnosis:  Principal Problem:   Obesity in pregnancy, antepartum Active Problems:   Obesity   Supervision of high risk pregnancy, antepartum   History of preterm delivery, currently pregnant   Language barrier   Back pain during pregnancy   Group B streptococcal bacteriuria  Additional problems: None   Discharge diagnosis: Term Pregnancy Delivered                                              Post partum procedures: None Augmentation: AROM and Cytotec Complications: None  Hospital course: Induction of Labor With Vaginal Delivery   35y.o. yo [id number] admitted to the hospital 05/05/2021 for induction of labor.  Indication for induction: Elective.  Patient had an uncomplicated labor course and precipitously moved through her active phase of labor. Other details of labor as follows: Membrane Rupture Time/Date: 6:25 AM ,05/06/2021   Delivery Method:Vaginal, Spontaneous  Episiotomy: None  Lacerations:  None  Details of delivery can be found in separate delivery note.  Patient had a routine postpartum course. Patient is discharged home 05/07/21.  Newborn Data: Birth date:05/06/2021  Birth time:8:16 AM  Gender:Female  Living status:Living  Apgars:9 ,9  Weight:3290 g   Magnesium Sulfate received: No BMZ received: No Rhophylac:N/A MMR:N/A T-DaP:Given prenatally Flu: Given prenatally Transfusion:No  Physical exam  Vitals:   05/06/21 1545 05/06/21 2010 05/06/21 2358 05/07/21 0653  BP: (!) 98/53 106/62 104/72 108/73  Pulse: 84 89 83 78  Resp: 16 18 18 20   Temp: 98.8 F (37.1 C) 98 F (36.7 C) 97.7 F (36.5 C) 97.8 F (36.6 C)   TempSrc: Oral Oral Oral Oral  SpO2: 100% 99% 99% 100%  Weight:      Height:       General: alert Lochia: appropriate Uterine Fundus: firm Incision: N/A DVT Evaluation: No evidence of DVT seen on physical exam. Labs: Lab Results  Component Value Date   WBC 8.7 05/05/2021   HGB 11.6 (L) 05/05/2021   HCT 34.3 (L) 05/05/2021   MCV 82.3 05/05/2021   PLT 195 05/05/2021   CMP Latest Ref Rng & Units 03/21/2019  Glucose 70 - 99 mg/dL 107(H)  BUN 6 - 20 mg/dL 5(L)  Creatinine 0.44 - 1.00 mg/dL 0.53  Sodium 135 - 145 mmol/L 134(L)  Potassium 3.5 - 5.1 mmol/L 3.6  Chloride 98 - 111 mmol/L 102  CO2 22 - 32 mmol/L 21(L)  Calcium 8.9 - 10.3 mg/dL 8.9  Total Protein 6.5 - 8.1 g/dL 8.1  Total Bilirubin 0.3 - 1.2 mg/dL 2.0(H)  Alkaline Phos 38 - 126 U/L 64  AST 15 - 41 U/L 18  ALT 0 - 44 U/L 30   Edinburgh Score: No flowsheet data found.   After visit meds:  Allergies as of 05/07/2021   No Known Allergies      Medication List     TAKE these medications    acetaminophen 325 MG tablet Commonly known as: Tylenol Take 2 tablets (650 mg total) by mouth every 4 (four) hours as needed (for pain scale < 4).  What changed:  when to take this reasons to take this   ibuprofen 600 MG tablet Commonly known as: ADVIL Take 1 tablet (600 mg total) by mouth every 6 (six) hours.   prenatal multivitamin Tabs tablet Take 1 tablet by mouth daily at 12 noon.         Discharge home in stable condition Infant Feeding: Breast Infant Disposition:home with mother Discharge instruction: per After Visit Summary and Postpartum booklet. Activity: Advance as tolerated. Pelvic rest for 6 weeks.  Diet: routine diet Future Appointments: Future Appointments  Date Time Provider [place]  06/06/2021 10:35 AM Aletha Halim, MD [facility]   Follow up Visit: Message sent to [facility] by Dr. Cy Blamer Please schedule this patient for a In person postpartum visit in 4 weeks with the following  provider: Any provider. Additional Postpartum F/U: None   Low risk pregnancy complicated by:  None Delivery mode:  Vaginal, Spontaneous  Anticipated Birth Control:   Plans outpatient Nexplanon  Renard Matter, MD, MPH OB Fellow, Faculty Practice

## 2021-05-06 NOTE — Progress Notes (Signed)
Wasted Fentanyl/Bupivacaine 239.4 ml in stericycle with Fabio Neighbors, RN. Pyxis down at this time.

## 2021-05-06 NOTE — Anesthesia Postprocedure Evaluation (Signed)
Anesthesia Post Note  Patient: Wetona Viramontes  Procedure(s) Performed: AN AD HOC LABOR EPIDURAL     Patient location during evaluation: Mother Baby Anesthesia Type: Epidural Level of consciousness: awake and alert Pain management: pain level controlled Vital Signs Assessment: post-procedure vital signs reviewed and stable Respiratory status: spontaneous breathing, nonlabored ventilation and respiratory function stable Cardiovascular status: stable Postop Assessment: no headache, no backache and epidural receding Anesthetic complications: no   No notable events documented.  Last Vitals:  Vitals:   05/06/21 1000 05/06/21 1100  BP: 117/72 114/70  Pulse: 83 85  Resp: 16 17  Temp: 36.9 C 37 C  SpO2: 100% 99%    Last Pain:  Vitals:   05/06/21 1257  TempSrc:   PainSc: 3    Pain Goal: Patients Stated Pain Goal: 3 (05/06/21 1257)                 Yonathan Perrow

## 2021-05-06 NOTE — Lactation Note (Signed)
This note was copied from a baby's chart. Lactation Consultation Note  Patient Name: Shannon Myers RCVEL'F Date: 05/06/2021 Reason for consult: Initial assessment;Mother's request;Term;Breastfeeding assistance Age:35 hours LC reviewed different latch positions to get more depth. Infant recent feeding of formula, did not latch at this time.  Plan 1. To feed based on cues 8-12x 24hr period. Mom to offer breasts and look for signs of milk transfer.  2. Mom to supplement with EBM first followed by formula. Mom using slow flow nipple and pace bottle feeding reviewed.  3. Mom expressed interest to use her personal hand pump. Mom to post pump after latching q 3hrs for 10 min each breast.  4. I and O sheet reviewed.  All questions answered at the end of the visit.  Maternal Data Has patient been taught Hand Expression?: Yes Does the patient have breastfeeding experience prior to this delivery?: Yes How long did the patient breastfeed?: breast and bottle fed for 4 months  Feeding Mother's Current Feeding Choice: Breast Milk and Formula Nipple Type: Slow - flow  LATCH Score                    Lactation Tools Discussed/Used Tools: Pump Breast pump type: Other (comment) (Mom bought personal hand pump and expressed interest to post pump after latching for stimulation. Mom aware give back any colostrum on spoon.) Pump Education: Setup, frequency, and cleaning;Milk Storage Reason for Pumping: increase stimulation Pumping frequency: every 3 hrs for 10 min each breast  Interventions Interventions: Breast feeding basics reviewed;Assisted with latch;Skin to skin;Breast massage;Hand express;Breast compression;Support pillows;Position options;Hand pump;Education;Pace feeding;LC Psychologist, educational;Infant Driven Feeding Algorithm education  Discharge Pump: Manual WIC Program: Yes  Consult Status Consult Status: Follow-up Date: 05/07/21 Follow-up type: In-patient    [place]   Nicholson[place]Springer 05/06/2021, 6:03 PM

## 2021-05-07 MED ORDER — ACETAMINOPHEN 325 MG PO TABS
650.0000 mg | ORAL_TABLET | ORAL | 0 refills | Status: DC | PRN
Start: 2021-05-07 — End: 2022-04-01

## 2021-05-07 MED ORDER — IBUPROFEN 600 MG PO TABS: 600.0000 mg | ORAL_TABLET | Freq: Four times a day (QID) | ORAL | 0 refills | Status: DC

## 2021-05-20 ENCOUNTER — Telehealth (HOSPITAL_COMMUNITY): Payer: Self-pay | Admitting: *Deleted

## 2021-05-20 NOTE — Telephone Encounter (Signed)
Hospital Discharge Follow-Up Call:  Spoke to patient with help of telephonic interpreter "Amy [phone]".  Patient reports that she is well and has no concerns about her healing process.  EPDS today was 0 and she endorses this accurately reflects that she is doing well emotionally.  Patient says that baby is well and she has no concerns about baby's health.  She reports that baby sleeps in a cradle.  ABCs of Safe Sleep reviewed.

## 2021-06-06 ENCOUNTER — Other Ambulatory Visit: Payer: Self-pay

## 2021-06-06 ENCOUNTER — Ambulatory Visit (INDEPENDENT_AMBULATORY_CARE_PROVIDER_SITE_OTHER): Payer: 59 | Admitting: Obstetrics and Gynecology

## 2021-06-06 ENCOUNTER — Encounter: Payer: Self-pay | Admitting: Obstetrics and Gynecology

## 2021-06-06 VITALS — BP 122/82 | HR 110 | Wt 204.0 lb

## 2021-06-06 DIAGNOSIS — Z603 Acculturation difficulty: Secondary | ICD-10-CM

## 2021-06-06 DIAGNOSIS — Z30017 Encounter for initial prescription of implantable subdermal contraceptive: Secondary | ICD-10-CM

## 2021-06-06 DIAGNOSIS — Z789 Other specified health status: Secondary | ICD-10-CM

## 2021-06-06 LAB — POCT PREGNANCY, URINE: Preg Test, Ur: NEGATIVE

## 2021-06-06 MED ORDER — ETONOGESTREL 68 MG ~~LOC~~ IMPL
68.0000 mg | DRUG_IMPLANT | Freq: Once | SUBCUTANEOUS | Status: AC
  Administered 2021-06-06: 68 mg via SUBCUTANEOUS

## 2021-06-06 NOTE — Progress Notes (Signed)
° ° °  Post Partum Visit Note  Shannon Myers is a 36 y.o. [id number] s/p 12/6 SVD/intact perineum at 39wks after elective IOL. Pregnancy only c/b on 17p for h/o preterm labor. Anesthesia: epidural. Postpartum course has been uncomplicated. Baby is doing well. Baby is feeding by both breast and bottle - Similac . Bleeding no bleeding and no period yet. Bowel function is normal. Bladder function is normal. Patient is not sexually active. Contraception method is abstinence. Postpartum depression screening: negative.    Edinburgh Postnatal Depression Scale - 06/06/21 1124       Edinburgh Postnatal Depression Scale:  In the Past 7 Days   I have been able to laugh and see the funny side of things. 0    I have looked forward with enjoyment to things. 0    I have blamed myself unnecessarily when things went wrong. 0    I have been anxious or worried for no good reason. 0    I have felt scared or panicky for no good reason. 0    Things have been getting on top of me. 0    I have been so unhappy that I have had difficulty sleeping. 0    I have felt sad or miserable. 0    I have been so unhappy that I have been crying. 0    The thought of harming myself has occurred to me. 0    Edinburgh Postnatal Depression Scale Total 0            Review of Systems Pertinent items noted in HPI and remainder of comprehensive ROS otherwise negative.  Objective:  BP 122/82    Pulse (!) 110    Wt 204 lb (92.5 kg)    Breastfeeding Yes    BMI 41.20 kg/m    NAD Nexplanon placed in LUE  Assessment:   Normal postpartum exam.   Plan:  *PP: routine care. Patient has used nexplanon before and no issue and another one was placed today. Pap smear neg and hpv neg 2022  Interpreter used  RTC PRN  Aletha Halim, [place] for [employer], [place]

## 2021-06-06 NOTE — Procedures (Signed)
Nexplanon Insertion Procedure Note Prior to the procedure being performed, the patient was asked to state their full name, date of birth, type of procedure being performed and the exact location of the operative site. This information was then checked against the documentation in the patient's chart. Prior to the procedure being performed, a "time out" was performed by the physician that confirmed the correct patient, procedure and site.  After informed consent was obtained, the patient's non-dominant left arm was chosen for insertion. A site was marked approximately 8 cm proximal to the medial epicondyle in the sulcus below the biceps and triceps on the inner surface. The area was cleaned with alcohol then local anesthesia was infiltrated with 3 ml of 1% lidocaine along the planned insertion track. The area was prepped with betadine. Using sterile technique the Nexplanon device was inserted per manufacturer's guidelines in the subdermal connective tissue using the standard insertion technique without difficulty. Pressure was applied and the insertion site was hemostatic. The presence of the Nexplanon was confirmed immediately after insertion by palpation by both me and the patient and by checking the tip of needle for the absence of the insert.  A pressure dressing was applied.  The patient tolerated the procedure well. Patient told to consider it effective  [place] Bing, Montez Hageman MD Attending Center for [employer] [facility])

## 2021-12-15 ENCOUNTER — Ambulatory Visit (HOSPITAL_COMMUNITY)
Admission: EM | Admit: 2021-12-15 | Discharge: 2021-12-15 | Disposition: A | Discharge status: Home / Self Care | Payer: 59 | Attending: Physician Assistant | Admitting: Physician Assistant

## 2021-12-15 ENCOUNTER — Encounter (HOSPITAL_COMMUNITY): Payer: Self-pay | Admitting: Emergency Medicine

## 2021-12-15 ENCOUNTER — Other Ambulatory Visit: Payer: Self-pay

## 2021-12-15 DIAGNOSIS — G5601 Carpal tunnel syndrome, right upper limb: Secondary | ICD-10-CM

## 2021-12-15 DIAGNOSIS — M62838 Other muscle spasm: Secondary | ICD-10-CM | POA: Diagnosis not present

## 2021-12-15 MED ORDER — PREDNISONE 10 MG (21) PO TBPK: ORAL_TABLET | Freq: Every day | ORAL | 0 refills | Status: DC

## 2021-12-15 MED ORDER — CYCLOBENZAPRINE HCL 5 MG PO TABS: 10.0000 mg | ORAL_TABLET | Freq: Three times a day (TID) | ORAL | 0 refills | Status: AC | PRN

## 2021-12-15 NOTE — Discharge Instructions (Signed)
Take flexeril as needed for neck muscle spasm.   Apply ice to affected areas Recommend stretching several times a day Take prednisone as prescribed.  Can take Tylenol as needed, but avoid NSAIDS like ibuprofen while on the prednisone If no improvement follow up with orthopedics.

## 2021-12-15 NOTE — ED Provider Notes (Signed)
[facility]    CSN: [id number] Arrival date & time: 12/15/21  1614      History   Chief Complaint Chief Complaint  Patient presents with   Shoulder Pain    HPI Shannon Myers is a 36 y.o. female.   Pt complains of right arm pain and right sided neck pain that started about two months ago.  Denies injury or trauma.  Reports numbness and tingling to the hands and fingers.  She is right handed.  She reports pain is worse first thing in the morning.  Denies right arm weakness. She denies repetitive use of right arm.     Past Medical History:  Diagnosis Date   Anemia    Group B streptococcal bacteriuria 03/23/2021   History of preterm delivery, currently pregnant 10/08/2020   Preterm labor     Patient Active Problem List   Diagnosis Date Noted   Hematuria 12/09/2020   Language barrier 10/08/2020    Past Surgical History:  Procedure Laterality Date   NO PAST SURGERIES      OB History     Gravida  9   Para  8   Term  7   Preterm  1   AB  1   Living  8      SAB  1   IAB      Ectopic      Multiple  0   Live Births  8            Home Medications    Prior to Admission medications   Medication Sig Start Date End Date Taking? Authorizing Provider  cyclobenzaprine (FLEXERIL) 5 MG tablet Take 2 tablets (10 mg total) by mouth 3 (three) times daily as needed for up to 7 days for muscle spasms. 12/15/21 12/22/21 Yes Ward, Tylene Fantasia, PA-C  predniSONE (STERAPRED UNI-PAK 21 TAB) 10 MG (21) TBPK tablet Take by mouth daily. Take 6 tabs by mouth daily  for 2 days, then 5 tabs for 2 days, then 4 tabs for 2 days, then 3 tabs for 2 days, 2 tabs for 2 days, then 1 tab by mouth daily for 2 days 12/15/21  Yes Ward, Tylene Fantasia, PA-C  acetaminophen (TYLENOL) 325 MG tablet Take 2 tablets (650 mg total) by mouth every 4 (four) hours as needed (for pain scale < 4). Patient not taking: Reported on 06/06/2021 05/07/21   Warner Mccreedy, MD  ibuprofen (ADVIL) 600 MG  tablet Take 1 tablet (600 mg total) by mouth every 6 (six) hours. Patient not taking: Reported on 06/06/2021 05/07/21   Warner Mccreedy, MD  Prenatal Vit-Fe Fumarate-FA (PRENATAL MULTIVITAMIN) TABS tablet Take 1 tablet by mouth daily at 12 noon.    [provider]    Family History Family History  Problem Relation Age of Onset   Diabetes Mother    Hypertension Mother    Healthy Father    Alcohol abuse Neg Hx    Asthma Neg Hx    Arthritis Neg Hx    Birth defects Neg Hx    Cancer Neg Hx    COPD Neg Hx    Depression Neg Hx    Drug abuse Neg Hx    Early death Neg Hx    Hearing loss Neg Hx    Heart disease Neg Hx    Hyperlipidemia Neg Hx    Kidney disease Neg Hx    Learning disabilities Neg Hx    Mental illness Neg Hx  Mental retardation Neg Hx    Miscarriages / Stillbirths Neg Hx    Stroke Neg Hx    Vision loss Neg Hx     Social History Social History   Tobacco Use   Smoking status: Never   Smokeless tobacco: Never  Vaping Use   Vaping Use: Never used  Substance Use Topics   Alcohol use: No   Drug use: No     Allergies   Patient has no known allergies.   Review of Systems Review of Systems  Constitutional:  Negative for chills and fever.  HENT:  Negative for ear pain and sore throat.   Eyes:  Negative for pain and visual disturbance.  Respiratory:  Negative for cough and shortness of breath.   Cardiovascular:  Negative for chest pain and palpitations.  Gastrointestinal:  Negative for abdominal pain and vomiting.  Genitourinary:  Negative for dysuria and hematuria.  Musculoskeletal:  Positive for arthralgias (right arm pain). Negative for back pain.  Skin:  Negative for color change and rash.  Neurological:  Positive for numbness. Negative for seizures and syncope.  All other systems reviewed and are negative.    Physical Exam Triage Vital Signs ED Triage Vitals  Enc Vitals Group     BP 12/15/21 1853 119/76     Pulse Rate 12/15/21 1853 71     Resp  12/15/21 1853 20     Temp 12/15/21 1853 99 F (37.2 C)     Temp Source 12/15/21 1853 Oral     SpO2 12/15/21 1853 98 %     Weight --      Height --      Head Circumference --      Peak Flow --      Pain Score 12/15/21 1849 8     Pain Loc --      Pain Edu? --      Excl. in GC? --    No data found.  Updated Vital Signs BP 119/76 (BP Location: Left Arm) Comment (BP Location): large cuff  Pulse 71   Temp 99 F (37.2 C) (Oral)   Resp 20   SpO2 98%   Visual Acuity Right Eye Distance:   Left Eye Distance:   Bilateral Distance:    Right Eye Near:   Left Eye Near:    Bilateral Near:     Physical Exam Vitals and nursing note reviewed.  Constitutional:      General: She is not in acute distress.    Appearance: She is well-developed.  HENT:     Head: Normocephalic and atraumatic.  Eyes:     Conjunctiva/sclera: Conjunctivae normal.  Cardiovascular:     Rate and Rhythm: Normal rate and regular rhythm.     Heart sounds: No murmur heard. Pulmonary:     Effort: Pulmonary effort is normal. No respiratory distress.     Breath sounds: Normal breath sounds.  Abdominal:     Palpations: Abdomen is soft.     Tenderness: There is no abdominal tenderness.  Musculoskeletal:        General: No swelling.     Cervical back: Neck supple.     Comments: Right trapezius TTP with spasm Ulnar nerve compression at the cubital space elicits numbness and tingling Positive tinel's   Skin:    General: Skin is warm and dry.     Capillary Refill: Capillary refill takes less than 2 seconds.  Neurological:     Mental Status: She is alert.  Psychiatric:  Mood and Affect: Mood normal.      UC Treatments / Results  Labs (all labs ordered are listed, but only abnormal results are displayed) Labs Reviewed - No data to display  EKG   Radiology No results found.  Procedures Procedures (including critical care time)  Medications Ordered in UC Medications - No data to  display  Initial Impression / Assessment and Plan / UC Course  I have reviewed the triage vital signs and the nursing notes.  Pertinent labs & imaging results that were available during my care of the patient were reviewed by me and considered in my medical decision making (see chart for details).     Right cervical muscle spasm, will send in flexeril to take as needed.   Right carpal tunnel, positive tinel's on exam. Possible cubital tunnel syndrome as well.   Prednisone dosepak and flexeril prescribed.  Supportive care discussed.  Stretching exercises given.  Advised follow up with orthopedics if no improvement.  Final Clinical Impressions(s) / UC Diagnoses   Final diagnoses:  Spasm of cervical paraspinous muscle  Right carpal tunnel syndrome     Discharge Instructions      Take flexeril as needed for neck muscle spasm.   Apply ice to affected areas Recommend stretching several times a day Take prednisone as prescribed.  Can take Tylenol as needed, but avoid NSAIDS like ibuprofen while on the prednisone If no improvement follow up with orthopedics.    ED Prescriptions     Medication Sig Dispense Auth. Provider   cyclobenzaprine (FLEXERIL) 5 MG tablet Take 2 tablets (10 mg total) by mouth 3 (three) times daily as needed for up to 7 days for muscle spasms. 21 tablet Ward, Tylene Fantasia, PA-C   predniSONE (STERAPRED UNI-PAK 21 TAB) 10 MG (21) TBPK tablet Take by mouth daily. Take 6 tabs by mouth daily  for 2 days, then 5 tabs for 2 days, then 4 tabs for 2 days, then 3 tabs for 2 days, 2 tabs for 2 days, then 1 tab by mouth daily for 2 days 42 tablet Ward, Tylene Fantasia, PA-C      PDMP not reviewed this encounter.   Ward, Tylene Fantasia, PA-C 12/15/21 1916

## 2021-12-15 NOTE — ED Triage Notes (Signed)
Right arm pain from shoulder to finger tips.  Stinging sensations in right arm for 2 months.  She is right handed    Wakes in the morning with inability to move right arm and has cramping in right arm.  Denies neck pain.

## 2022-03-26 ENCOUNTER — Encounter (HOSPITAL_COMMUNITY): Payer: Self-pay | Admitting: Emergency Medicine

## 2022-03-26 ENCOUNTER — Emergency Department (HOSPITAL_COMMUNITY)
Admission: EM | Admit: 2022-03-26 | Discharge: 2022-03-27 | Disposition: A | Discharge status: Home / Self Care | Payer: 59 | Attending: Emergency Medicine | Admitting: Emergency Medicine

## 2022-03-26 ENCOUNTER — Emergency Department (HOSPITAL_COMMUNITY): Payer: 59

## 2022-03-26 ENCOUNTER — Other Ambulatory Visit: Payer: Self-pay

## 2022-03-26 DIAGNOSIS — R0602 Shortness of breath: Secondary | ICD-10-CM | POA: Insufficient documentation

## 2022-03-26 DIAGNOSIS — R55 Syncope and collapse: Secondary | ICD-10-CM | POA: Diagnosis present

## 2022-03-26 DIAGNOSIS — R519 Headache, unspecified: Secondary | ICD-10-CM | POA: Insufficient documentation

## 2022-03-26 DIAGNOSIS — M542 Cervicalgia: Secondary | ICD-10-CM | POA: Insufficient documentation

## 2022-03-26 DIAGNOSIS — R569 Unspecified convulsions: Secondary | ICD-10-CM | POA: Diagnosis not present

## 2022-03-26 DIAGNOSIS — W109XXA Fall (on) (from) unspecified stairs and steps, initial encounter: Secondary | ICD-10-CM | POA: Diagnosis not present

## 2022-03-26 DIAGNOSIS — Y9301 Activity, walking, marching and hiking: Secondary | ICD-10-CM | POA: Diagnosis not present

## 2022-03-26 DIAGNOSIS — M25572 Pain in left ankle and joints of left foot: Secondary | ICD-10-CM | POA: Diagnosis not present

## 2022-03-26 DIAGNOSIS — R079 Chest pain, unspecified: Secondary | ICD-10-CM | POA: Insufficient documentation

## 2022-03-26 DIAGNOSIS — H538 Other visual disturbances: Secondary | ICD-10-CM | POA: Diagnosis not present

## 2022-03-26 LAB — BASIC METABOLIC PANEL
Anion gap: 13 (ref 5–15)
BUN: 11 mg/dL (ref 6–20)
CO2: 22 mmol/L (ref 22–32)
Calcium: 9.4 mg/dL (ref 8.9–10.3)
Chloride: 105 mmol/L (ref 98–111)
Creatinine, Ser: 0.65 mg/dL (ref 0.44–1.00)
GFR, Estimated: >60 mL/min (ref >60)
Glucose, Bld: 99 mg/dL (ref 70–99)
Potassium: 3.3 mmol/L — ABNORMAL LOW (ref 3.5–5.1)
Sodium: 140 mmol/L (ref 135–145)

## 2022-03-26 LAB — CBC WITH DIFFERENTIAL/PLATELET
Abs Immature Granulocytes: 0.05 10*3/uL (ref 0.00–0.07)
Basophils Absolute: 0.1 10*3/uL (ref 0.0–0.1)
Basophils Relative: 1 %
Eosinophils Absolute: 0.2 10*3/uL (ref 0.0–0.5)
Eosinophils Relative: 2 %
HCT: 41.6 % (ref 36.0–46.0)
Hemoglobin: 14.6 g/dL (ref 12.0–15.0)
Immature Granulocytes: 1 %
Lymphocytes Relative: 34 %
Lymphs Abs: 3.4 10*3/uL (ref 0.7–4.0)
MCH: 30.4 pg (ref 26.0–34.0)
MCHC: 35.1 g/dL (ref 30.0–36.0)
MCV: 86.5 fL (ref 80.0–100.0)
Monocytes Absolute: 0.8 10*3/uL (ref 0.1–1.0)
Monocytes Relative: 8 %
Neutro Abs: 5.5 10*3/uL (ref 1.7–7.7)
Neutrophils Relative %: 54 %
Platelets: 260 10*3/uL (ref 150–400)
RBC: 4.81 MIL/uL (ref 3.87–5.11)
RDW: 12.2 % (ref 11.5–15.5)
WBC: 9.9 10*3/uL (ref 4.0–10.5)
nRBC: 0 % (ref 0.0–0.2)

## 2022-03-26 NOTE — ED Triage Notes (Signed)
Pt reports on Tuesday of this week she felt dizzy, blacked out, and fell down a flight of 3 stairs. Pt C/O left ankle pain and head pain.

## 2022-03-26 NOTE — ED Provider Triage Note (Signed)
Emergency Medicine Provider Triage Evaluation Note  Ayahna Dru Laurel , a 36 y.o. female  was evaluated in triage.  Pt complains of syncopal episode.  She never had a syncopal episode before but was walking down the stairs 3 days ago and blacked out and fell down 3 stairs.  She is unsure if she hit her head.  Has been having intermittent blurry vision since then, also endorses headache.  She has some pain to the chest wall and left ankle as well.  Denies any prodromal chest pain, lateralized weakness or numbness.  Not on any medications, no history of seizures or cardiac disease..  Review of Systems  Per HPI  Physical Exam  BP 128/78 (BP Location: Left Arm)   Pulse 89   Temp 98.3 F (36.8 C)   Resp 20   SpO2 99%  Gen:   Awake, no distress   Resp:  Normal effort  MSK:   Decreased ROM to left ankle secondary to pain.   Other:  CN II-XII in tact.  Upper and lower extremity strength symmetric bilaterally.  No dysarthria, abdomen is soft nontender.  Regular rhythm.  Medical Decision Making  Medically screening exam initiated at 9:42 PM.  Appropriate orders placed.  Kimmell was informed that the remainder of the evaluation will be completed by another provider, this initial triage assessment does not replace that evaluation, and the importance of remaining in the ED until their evaluation is complete.  [employer] attorney. Syncope and MSK.    Sherrill Raring, PA-C 03/26/22 2144

## 2022-03-27 LAB — I-STAT BETA HCG BLOOD, ED (MC, WL, AP ONLY): I-stat hCG, quantitative: <5 m[IU]/mL (ref <5)

## 2022-03-27 MED ORDER — KETOROLAC TROMETHAMINE 30 MG/ML IJ SOLN
30.0000 mg | Freq: Once | INTRAMUSCULAR | Status: AC
  Administered 2022-03-27: 30 mg via INTRAVENOUS
  Filled 2022-03-27: qty 1

## 2022-03-27 MED ORDER — SODIUM CHLORIDE 0.9 % IV BOLUS
1000.0000 mL | Freq: Once | INTRAVENOUS | Status: AC
  Administered 2022-03-27: 1000 mL via INTRAVENOUS

## 2022-03-27 NOTE — ED Notes (Signed)
Using interpreter, pt verbalized understanding of discharge paperwork and follow-up care.

## 2022-03-27 NOTE — ED Provider Notes (Addendum)
[facility] EMERGENCY DEPARTMENT Provider Note   CSN: [id number] Arrival date & time: 03/26/22  1900     History  Chief Complaint  Patient presents with   Dizziness    Shannon Myers is a 36 y.o. female to the ER with complaint of syncopal episode and fall that occurred on Tuesday.  She states she was walking down the stairs and began to have blurry vision and cold sweats.  She states her vision then turned black and she fell down the stairs injuring her ankle.  She is unsure if she hit her head, but has been having intermittent blurry vision since then and endorses headache and neck pain.  She remembers all events leading up to and after the fall, states she was sitting on the floor after and her ankle was very painful.  She had chest pain and shortness of breath with the episode.  Denies any prodromal chest pain, lateralized weakness, or numbness in the extremities.  She denies any medical problems and does not take daily medications or supplements.  Denies drug or alcohol use.  Endorses regular appetite, drinking normally.  Denies nausea, vomiting, diarrhea, palpitations, leg swelling, fever, chills,   HPI obtained with Spanish Interpreter Rose [phone]  A language interpreter was used.  Dizziness Associated symptoms: chest pain, headaches and shortness of breath   Associated symptoms: no diarrhea, no nausea, no palpitations, no tinnitus, no vomiting and no weakness        Home Medications Prior to Admission medications   Medication Sig Start Date End Date Taking? Authorizing Provider  acetaminophen (TYLENOL) 325 MG tablet Take 2 tablets (650 mg total) by mouth every 4 (four) hours as needed (for pain scale < 4). Patient not taking: Reported on 06/06/2021 05/07/21   Warner Mccreedy, MD  ibuprofen (ADVIL) 600 MG tablet Take 1 tablet (600 mg total) by mouth every 6 (six) hours. Patient not taking: Reported on 06/06/2021 05/07/21   Warner Mccreedy, MD  predniSONE (STERAPRED  UNI-PAK 21 TAB) 10 MG (21) TBPK tablet Take by mouth daily. Take 6 tabs by mouth daily  for 2 days, then 5 tabs for 2 days, then 4 tabs for 2 days, then 3 tabs for 2 days, 2 tabs for 2 days, then 1 tab by mouth daily for 2 days 12/15/21   Ward, Tylene Fantasia, PA-C  Prenatal Vit-Fe Fumarate-FA (PRENATAL MULTIVITAMIN) TABS tablet Take 1 tablet by mouth daily at 12 noon.    [provider]      Allergies    Patient has no known allergies.    Review of Systems   Review of Systems  Constitutional:  Positive for diaphoresis. Negative for chills and fever.  HENT:  Negative for tinnitus.   Eyes:  Positive for visual disturbance.  Respiratory:  Positive for shortness of breath.   Cardiovascular:  Positive for chest pain. Negative for palpitations and leg swelling.  Gastrointestinal:  Negative for diarrhea, nausea and vomiting.  Musculoskeletal:  Positive for neck pain.       Left ankle pain  Skin:  Negative for color change.  Neurological:  Positive for dizziness, syncope and headaches. Negative for seizures, weakness and numbness.  Psychiatric/Behavioral:  Negative for confusion.     Physical Exam Updated Vital Signs BP (!) 148/95   Pulse 97   Temp 98.7 F (37.1 C) (Oral)   Resp 18   Ht 4\' 11"  (1.499 m)   Wt 92.5 kg   SpO2 100%   BMI 41.19  kg/m  Physical Exam Vitals and nursing note reviewed.  Constitutional:      General: She is not in acute distress.    Appearance: She is well-developed. She is not ill-appearing.  HENT:     Head: Normocephalic and atraumatic.     Mouth/Throat:     Mouth: Mucous membranes are moist.     Pharynx: Oropharynx is clear.  Eyes:     General: No visual field deficit.    Extraocular Movements: Extraocular movements intact.     Right eye: Nystagmus present.     Left eye: Nystagmus present.     Conjunctiva/sclera: Conjunctivae normal.     Pupils: Pupils are equal, round, and reactive to light.     Comments: Horizontal jerk nystagmus with  leftward gaze  Neck:     Comments: Tenderness to paracervical spinal muscles on right side, limited ROM when looking to right due to pain. Cardiovascular:     Rate and Rhythm: Normal rate and regular rhythm.     Pulses: Normal pulses.     Heart sounds: Normal heart sounds.  Pulmonary:     Effort: Pulmonary effort is normal. No tachypnea or respiratory distress.     Breath sounds: Normal breath sounds and air entry.  Abdominal:     General: Abdomen is flat. Bowel sounds are normal. There is no distension.     Palpations: Abdomen is soft.     Tenderness: There is no abdominal tenderness.  Musculoskeletal:     Cervical back: Normal range of motion. Pain with movement and muscular tenderness present. No spinous process tenderness.     Right lower leg: Normal. No edema.     Left lower leg: No edema.     Right ankle: Normal.     Right Achilles Tendon: Normal.     Left ankle: Swelling present. No deformity. Tenderness present over the lateral malleolus and medial malleolus. Decreased range of motion.     Left Achilles Tendon: Normal.  Skin:    General: Skin is warm and dry.     Capillary Refill: Capillary refill takes less than 2 seconds.  Neurological:     General: No focal deficit present.     Mental Status: She is alert. Mental status is at baseline.     GCS: GCS eye subscore is 4. GCS verbal subscore is 5. GCS motor subscore is 6.     Cranial Nerves: Cranial nerves 2-12 are intact.     Sensory: Sensation is intact.     Motor: Motor function is intact.     Coordination: Coordination is intact.  Psychiatric:        Mood and Affect: Mood normal.        Behavior: Behavior normal. Behavior is cooperative.     ED Results / Procedures / Treatments   Labs (all labs ordered are listed, but only abnormal results are displayed) Labs Reviewed  BASIC METABOLIC PANEL - Abnormal; Notable for the following components:      Result Value   Potassium 3.3 (*)    All other components within  normal limits  CBC WITH DIFFERENTIAL/PLATELET  I-STAT BETA HCG BLOOD, ED ([facility], [facility], [facility] ONLY)    EKG EKG Interpretation  Date/Time:  Thursday March 26 2022 21:41:27 EDT Ventricular Rate:  88 PR Interval:  170 QRS Duration: 84 QT Interval:  362 QTC Calculation: 438 R Axis:   81 Text Interpretation: Normal sinus rhythm Normal ECG When compared with ECG of 14-Dec-2014 21:03, HEART RATE has decreased Confirmed  by Delora Fuel ([id number]) on 03/27/2022 12:19:42 AM  Radiology CT Head Wo Contrast  Result Date: 03/26/2022 CLINICAL DATA:  Headache, sudden, severe. Pt reports on Tuesday of this week she felt dizzy, blacked out, and fell down a flight of 3 stairs. Pt C/O left ankle pain and head pain. EXAM: CT HEAD WITHOUT CONTRAST TECHNIQUE: Contiguous axial images were obtained from the base of the skull through the vertex without intravenous contrast. RADIATION DOSE REDUCTION: This exam was performed according to the departmental dose-optimization program which includes automated exposure control, adjustment of the mA and/or kV according to patient size and/or use of iterative reconstruction technique. COMPARISON:  None Available. FINDINGS: Brain: No evidence of large-territorial acute infarction. No parenchymal hemorrhage. No mass lesion. No extra-axial collection. No mass effect or midline shift. No hydrocephalus. Basilar cisterns are patent. Vascular: No hyperdense vessel. Skull: No acute fracture or focal lesion. Sinuses/Orbits: Paranasal sinuses and mastoid air cells are clear. The orbits are unremarkable. Other: None. IMPRESSION: No acute intracranial abnormality. Electronically Signed   By: Iven Finn M.D.   On: 03/26/2022 22:43   DG Chest 2 View  Result Date: 03/26/2022 CLINICAL DATA:  pain EXAM: CHEST - 2 VIEW COMPARISON:  Cxr 12/14/14 FINDINGS: The heart and mediastinal contours are within normal limits. No focal consolidation. No pulmonary edema. No pleural effusion. No pneumothorax. No  acute osseous abnormality. IMPRESSION: No active cardiopulmonary disease. Electronically Signed   By: Iven Finn M.D.   On: 03/26/2022 22:36   DG Ankle Complete Left  Result Date: 03/26/2022 CLINICAL DATA:  pain EXAM: LEFT ANKLE COMPLETE - 3+ VIEW COMPARISON:  None Available. FINDINGS: There is no evidence of fracture, dislocation, or joint effusion. There is no evidence of arthropathy or other focal bone abnormality. Soft tissues are unremarkable. IMPRESSION: Negative. Electronically Signed   By: Iven Finn M.D.   On: 03/26/2022 22:29    Procedures Procedures    Medications Ordered in ED Medications  ketorolac (TORADOL) 30 MG/ML injection 30 mg (30 mg Intravenous Given 03/27/22 0906)  sodium chloride 0.9 % bolus 1,000 mL (1,000 mLs Intravenous New Bag/Given 03/27/22 0906)    ED Course/ Medical Decision Making/ A&P                           Medical Decision Making Problems Addressed: Acute left ankle pain: acute illness or injury Syncope, unspecified syncope type: undiagnosed new problem with uncertain prognosis  Risk Prescription drug management.   This patient presents to the ED with chief complaint(s) of dizziness, syncopal episode that resulted in a fall; injury and pain to neck and left ankle with pertinent past medical history of no reported medical problems.The complaint involves an extensive differential diagnosis and also carries with it a high risk of complications and morbidity.    The differential diagnosis includes syncope, dysrhythmias, CVA, IIH, dehydration, hypoglycemia, electrolyte disturbance, orthostatic hypotension, seizure, vertigo, anemia  The initial plan is to order ECG, CT head, x-ray of chest and ankle, BMP, CBC, rule out pregnancy.   Additional history obtained: Records reviewed  urgent care visit from 12/15/2021  Independent labs interpretation:  The following labs were independently interpreted: CBC shows no evidence of leukocytosis or  anemia.  BMP significant for potassium 3.3, no other electrolyte disturbance.  Independent visualization and interpretation of imaging: - I independently visualized the following imaging with scope of interpretation limited to determining acute life threatening conditions related to emergency care: CT head, CXR, ankle x-ray,  which revealed no acute intracranial abnormality, ventricles do appear small to me; CXR reveals no active cardiopulmonary disease, no pneumothorax, no pleural effusion, heart is normal size; left ankle x-ray negative for fracture or dislocation.  ECG demonstrates normal sinus rhythm without ischemia, ectopy, or infarction.  Treatment and Reassessment: We will treat patient's pain with Toradol and give fluid bolus.  Obtained orthostatic vital signs which were negative for hypotension.  Patient did have one blood pressure reading of 98/63.  On physical exam, heart rate and rhythm regular, heart sounds normal, pulses.  LCTAB.  She is not in respiratory distress.  No bony tenderness over cervical spine, tenderness to paracervical spinal muscles on the right side.  Left ankle has been wrapped in Ace wrap, is tender with range of motion and palpation.  Neuro exam reveals equal strength bilaterally in upper and lower extremities, no facial droop, no difficulty with speech.  EOM intact, jerk horizontal nystagmus with left gaze.  [place] Syncope Score = 0  Physical exam and work-up are reassuring for non-life-threatening or emergent cause to patient's syncopal episode.  She has no dysrhythmias, severe electrolyte disturbances, anemia.  Head CT was negative for intracranial abnormalities.  Based on patient's presentation with headache, vertigo, vision blurring, and syncopal episode I do have suspicion this could be related to IIH.  I believe patient is stable and appropriate for discharge with close follow-up with neurology.  I will place a referral for outpatient neurology appointment.  Will  advise patient to use OTC NSAIDs for ankle and neck pain.  Provided patient with strict return precautions and need for ER reassessment.  Discussed HPI, physical exam, assessment and plan of patient with attending Gerhard Munch who agrees with plan of discharge and neurology follow-up.    Final Clinical Impression(s) / ED Diagnoses Final diagnoses:  Syncope, unspecified syncope type  Acute left ankle pain    Rx / DC Orders ED Discharge Orders          Ordered    Ambulatory referral to Neurology       Comments: An appointment is requested in approximately: 1 week   03/27/22 1029              Lenard Simmer, [place] 03/27/22 1030    Melton Alar [place], [place] 03/27/22 1118    Gerhard Munch, MD 03/27/22 [phone]

## 2022-03-27 NOTE — Discharge Instructions (Addendum)
Lo evaluaron en la sala de emergencias por sncope. Te he remitido a Hydrologist. Programe una cita con ellos lo antes posible.  Adjunto informacin sobre el sncope y el tratamiento del dolor de [place]. Tome ibuprofeno de 400 a 600 mg cada 6 horas segn sea necesario para el dolor.  Regrese a la sala de emergencias si presenta sntomas nuevos o que empeoran o si experimenta ms episodios de sncope, dolor de cabeza intenso, dolor en el pecho o dificultad para respirar.

## 2022-04-01 ENCOUNTER — Encounter (HOSPITAL_COMMUNITY): Payer: Self-pay | Admitting: Emergency Medicine

## 2022-04-01 ENCOUNTER — Emergency Department (HOSPITAL_COMMUNITY): Payer: 59

## 2022-04-01 ENCOUNTER — Ambulatory Visit (INDEPENDENT_AMBULATORY_CARE_PROVIDER_SITE_OTHER): Payer: 59 | Admitting: Neurology

## 2022-04-01 ENCOUNTER — Emergency Department (HOSPITAL_COMMUNITY)
Admission: EM | Admit: 2022-04-01 | Discharge: 2022-04-02 | Disposition: A | Discharge status: Home / Self Care | Payer: 59 | Attending: Emergency Medicine | Admitting: Emergency Medicine

## 2022-04-01 ENCOUNTER — Encounter: Payer: Self-pay | Admitting: Neurology

## 2022-04-01 ENCOUNTER — Emergency Department (HOSPITAL_BASED_OUTPATIENT_CLINIC_OR_DEPARTMENT_OTHER): Payer: 59

## 2022-04-01 VITALS — BP 125/80 | HR 92 | Ht 59.0 in | Wt 222.4 lb

## 2022-04-01 DIAGNOSIS — R0602 Shortness of breath: Secondary | ICD-10-CM

## 2022-04-01 DIAGNOSIS — R55 Syncope and collapse: Secondary | ICD-10-CM

## 2022-04-01 DIAGNOSIS — R600 Localized edema: Secondary | ICD-10-CM | POA: Diagnosis not present

## 2022-04-01 DIAGNOSIS — R0789 Other chest pain: Secondary | ICD-10-CM | POA: Insufficient documentation

## 2022-04-01 DIAGNOSIS — R079 Chest pain, unspecified: Secondary | ICD-10-CM

## 2022-04-01 DIAGNOSIS — R609 Edema, unspecified: Secondary | ICD-10-CM

## 2022-04-01 LAB — BASIC METABOLIC PANEL
Anion gap: 10 (ref 5–15)
BUN: 12 mg/dL (ref 6–20)
CO2: 23 mmol/L (ref 22–32)
Calcium: 9 mg/dL (ref 8.9–10.3)
Chloride: 105 mmol/L (ref 98–111)
Creatinine, Ser: 0.48 mg/dL (ref 0.44–1.00)
GFR, Estimated: >60 mL/min (ref >60)
Glucose, Bld: 94 mg/dL (ref 70–99)
Potassium: 3.5 mmol/L (ref 3.5–5.1)
Sodium: 138 mmol/L (ref 135–145)

## 2022-04-01 LAB — CBC
HCT: 42.7 % (ref 36.0–46.0)
Hemoglobin: 14.6 g/dL (ref 12.0–15.0)
MCH: 29.4 pg (ref 26.0–34.0)
MCHC: 34.2 g/dL (ref 30.0–36.0)
MCV: 86.1 fL (ref 80.0–100.0)
Platelets: 262 10*3/uL (ref 150–400)
RBC: 4.96 MIL/uL (ref 3.87–5.11)
RDW: 12 % (ref 11.5–15.5)
WBC: 8.2 10*3/uL (ref 4.0–10.5)
nRBC: 0 % (ref 0.0–0.2)

## 2022-04-01 LAB — TROPONIN I (HIGH SENSITIVITY)
Troponin I (High Sensitivity): <2 ng/L (ref <18)
Troponin I (High Sensitivity): 2 ng/L (ref <18)

## 2022-04-01 LAB — I-STAT BETA HCG BLOOD, ED (MC, WL, AP ONLY): I-stat hCG, quantitative: <5 m[IU]/mL (ref <5)

## 2022-04-01 LAB — D-DIMER, QUANTITATIVE: D-Dimer, Quant: <0.27 ug/mL-FEU (ref 0.00–0.50)

## 2022-04-01 MED ORDER — ACETAMINOPHEN 500 MG PO TABS: 500.0000 mg | ORAL_TABLET | Freq: Four times a day (QID) | ORAL | 0 refills | Status: DC | PRN

## 2022-04-01 MED ORDER — ALUM & MAG HYDROXIDE-SIMETH 200-200-20 MG/5ML PO SUSP
30.0000 mL | Freq: Once | ORAL | Status: AC
  Administered 2022-04-01: 30 mL via ORAL
  Filled 2022-04-01: qty 30

## 2022-04-01 MED ORDER — MORPHINE SULFATE (PF) 4 MG/ML IV SOLN
4.0000 mg | Freq: Once | INTRAVENOUS | Status: AC
  Administered 2022-04-01: 4 mg via INTRAVENOUS
  Filled 2022-04-01: qty 1

## 2022-04-01 NOTE — ED Provider Notes (Signed)
[facility] EMERGENCY DEPARTMENT Provider Note   CSN: [id number] Arrival date & time: 04/01/22  1354     History  Chief Complaint  Patient presents with   Chest Pain    Shannon Myers is a 36 y.o. female.  The history is provided by the patient and medical records. The history is limited by a language barrier. A language interpreter was used.  Chest Pain    Shannon Myers is a 36 yo female who presents with chest pain and diarrhea. Onset was about 1wk ago. Pt describes the pain as achy and is located in the mid/left chest that radiates to the neck and comes and goes. Pt has tried tylenol with no relief. The pain does not worsen with activity. Pt states she is also having some numbness/tingling in the left hand and left ankle edema. Patient reports nausea, diarrhea and abdominal pain. Denies fever/chills, SOB, cough, recent travel, hemoptysis, leg pain.    Home Medications Prior to Admission medications   Medication Sig Start Date End Date Taking? Authorizing Provider  acetaminophen (TYLENOL) 325 MG tablet Take 2 tablets (650 mg total) by mouth every 4 (four) hours as needed (for pain scale < 4). 05/07/21   Warner Mccreedy, MD  ibuprofen (ADVIL) 600 MG tablet Take 1 tablet (600 mg total) by mouth every 6 (six) hours. 05/07/21   Warner Mccreedy, MD      Allergies    Patient has no known allergies.    Review of Systems   Review of Systems  Cardiovascular:  Positive for chest pain.  All other systems reviewed and are negative.   Physical Exam Updated Vital Signs BP 131/81 (BP Location: Right Arm)   Pulse 92   Temp 99.7 F (37.6 C) (Oral)   Resp 16   SpO2 98%  Physical Exam Vitals and nursing note reviewed.  Constitutional:      General: She is not in acute distress.    Appearance: She is well-developed.  HENT:     Head: Atraumatic.  Eyes:     Conjunctiva/sclera: Conjunctivae normal.  Cardiovascular:     Rate and Rhythm: Normal rate and regular rhythm.      Pulses: Normal pulses.     Heart sounds: Normal heart sounds.  Pulmonary:     Effort: Pulmonary effort is normal.  Abdominal:     Palpations: Abdomen is soft.     Tenderness: There is no abdominal tenderness.  Musculoskeletal:     Cervical back: Neck supple.     Right lower leg: No edema.     Left lower leg: Edema (Trace edema noted to left lower extremity with tenderness about the left ankle.) present.  Skin:    Findings: No rash.  Neurological:     Mental Status: She is alert. Mental status is at baseline.  Psychiatric:        Mood and Affect: Mood normal.     ED Results / Procedures / Treatments   Labs (all labs ordered are listed, but only abnormal results are displayed) Labs Reviewed  BASIC METABOLIC PANEL  CBC  D-DIMER, QUANTITATIVE  I-STAT BETA HCG BLOOD, ED ([facility], [facility], [facility] ONLY)  TROPONIN I (HIGH SENSITIVITY)  TROPONIN I (HIGH SENSITIVITY)    EKG EKG Interpretation  Date/Time:  Wednesday April 01 2022 14:07:28 EDT Ventricular Rate:  90 PR Interval:  160 QRS Duration: 84 QT Interval:  364 QTC Calculation: 445 R Axis:   -12 Text Interpretation: Normal sinus rhythm Normal ECG When  compared with ECG of 26-Mar-2022 21:41, PREVIOUS ECG IS PRESENT No significant change was found Confirmed by Glynn Octaveancour, Stephen [phone]([id number]) on 04/01/2022 3:31:47 PM  Radiology VAS US LOWER EXTREMITY VENOUS (DVT) (7a-7p)  Result Date: 04/01/2022  Lower Venous DVT Study Patient Name:  Shannon Myers  Date of Exam:   04/01/2022 Medical Rec #: [id number]          Accession #:    [id number] Date of Birth: 08-25-1985          Patient Gender: F Patient Age:   [age] years [facility] Procedure:      VAS US LOWER EXTREMITY VENOUS (DVT) Referring Phys: Fayrene HelperBOWIE [place] --------------------------------------------------------------------------------  Indications: Edema, and SOB.  Limitations: Poor ultrasound/tissue interface. Comparison Study: No previous exams Performing Technologist:  Jean Rosenthalachel Hodge RDMS, RVT  Examination Guidelines: A complete evaluation includes B-mode imaging, spectral Doppler, color Doppler, and power Doppler as needed of all accessible portions of each vessel. Bilateral testing is considered an integral part of a complete examination. Limited examinations for reoccurring indications may be performed as noted. The reflux portion of the exam is performed with the patient in reverse Trendelenburg.  +-----+---------------+---------+-----------+----------+--------------+ RIGHTCompressibilityPhasicitySpontaneityPropertiesThrombus Aging +-----+---------------+---------+-----------+----------+--------------+ CFV  Full           Yes      Yes                                 +-----+---------------+---------+-----------+----------+--------------+   +---------+---------------+---------+-----------+----------+-------------------+ LEFT     CompressibilityPhasicitySpontaneityPropertiesThrombus Aging      +---------+---------------+---------+-----------+----------+-------------------+ CFV      Full           Yes      Yes                                      +---------+---------------+---------+-----------+----------+-------------------+ SFJ      Full                                                             +---------+---------------+---------+-----------+----------+-------------------+ FV Prox  Full                                                             +---------+---------------+---------+-----------+----------+-------------------+ FV Mid   Full                                                             +---------+---------------+---------+-----------+----------+-------------------+ FV DistalFull                                                             +---------+---------------+---------+-----------+----------+-------------------+ PFV  Full                                                              +---------+---------------+---------+-----------+----------+-------------------+ POP      Full           Yes      Yes                                      +---------+---------------+---------+-----------+----------+-------------------+ PTV      Full                                                             +---------+---------------+---------+-----------+----------+-------------------+ PERO     Full                                         Not well visualized +---------+---------------+---------+-----------+----------+-------------------+    Summary: RIGHT: - No evidence of common femoral vein obstruction.  LEFT: - There is no evidence of deep vein thrombosis in the lower extremity.  - No cystic structure found in the popliteal fossa.  *See table(s) above for measurements and observations. Electronically signed by Sherald Hess MD on 04/01/2022 at 6:01:58 PM.    Final    DG Chest 2 View  Result Date: 04/01/2022 CLINICAL DATA:  Mid chest pain for 1 week. EXAM: CHEST - 2 VIEW COMPARISON:  03/26/2022 FINDINGS: The heart size and mediastinal contours are within normal limits. Low inspiratory lung volumes are again noted. Both lungs are clear. The visualized skeletal structures are unremarkable. IMPRESSION: Low inspiratory lung volumes. No active cardiopulmonary disease. Electronically Signed   By: Danae Orleans M.D.   On: 04/01/2022 14:36    Procedures Procedures    Medications Ordered in ED Medications  alum & mag hydroxide-simeth (MAALOX/MYLANTA) 200-200-20 MG/5ML suspension 30 mL (30 mLs Oral Given 04/01/22 1804)  morphine (PF) 4 MG/ML injection 4 mg (4 mg Intravenous Given 04/01/22 2006)    ED Course/ Medical Decision Making/ A&P                           Medical Decision Making Amount and/or Complexity of Data Reviewed Labs: ordered. Radiology: ordered.  Risk OTC drugs. Prescription drug management.   BP 131/81 (BP Location: Right Arm)   Pulse 92   Temp 99.7 F  (37.6 C) (Oral)   Resp 16   SpO2 98%   2:75 PM 36 year old female, Spanish-speaking, significant history of anemia who was sent here from neurology office for evaluation of chest pain.  History obtained using interpreter.  Patient report a week ago she was at home but had a syncopal episode.  She did not know what caused her to pass out but after her single episode she has been having pain in her chest.  She described pain as a sharp pleuritic pain to her mid chest and left chest worse with exertion and with ambulating  or taking deep breath.  She also noticed pain to her left ankle and leg since the fall.  She also endorsed having headache, feeling nauseous, and having occasional bouts of diarrhea.  She does some chills without fever.  She denies any significant cough, hemoptysis, abdominal pain or urinary symptoms.  She denies tobacco or alcohol use.  She does endorse some family history of cardiac disease but no premature cardiac death.  She denies any prior history of PE or DVT no recent chest surgery prolonged bedrest active cancer or taking oral hormone.  She is on Nexplanon.  She was initially evaluated in the ER on 10/24 after her fall.  Initial concern was for intracranial hypertension and patient was recommended to be seen by neurology.  Patient was seen by neurology today but due to having syncope and chest pain she was encouraged to come to the ER for further assessment and to follow-up with cardiology for syncope/chest pain.  On exam this is a Hispanic female slightly obese resting comfortably in bed and appears to be in no acute discomfort.  Heart and lung sounds normal.  Abdomen is soft and nontender.  She does have trace edema to her left lower extremity with tenderness to palpation of her left calf.  She has intact dorsalis pedis pulse.  She has minimal tenderness about her left ankle.  Her vital signs are reassuring initial labs and imaging obtained and reviewed interpreted by me and I  agree with radiology interpretation.  Her pregnancy test is negative, she has normal electrolyte panels, normal renal function, normal WBC and normal H&H.  Her initial troponin is normal.  Her EKG shows normal sinus rhythm.  Chest x-ray shows low inspiratory lung volumes but otherwise no active cardiopulmonary disease.  Plan to obtain venous Doppler ultrasounds of her left lower extremity as well as a D-dimer to rule out potential PE/DVT.  Patient hear score is 1, low risk of MACE.    4:17 PM Fortunately a D-dimer obtained and reviewed by me and is negative therefore low suspicion for PE or DVT.  Venous Doppler study of left lower extremity is negative for DVT as well.  Patient did receive GI cocktail for her chest discomfort.  8:25 PM At this time, I recommend patient to follow-up outpatient with cardiology for further evaluation of her syncope and chest pain.  No other acute emergent medical condition identified.  Return precaution given.  I discussed care using Spanish interpreter and answered questions.  HPI -Labs ordered, independently viewed and interpreted by me.  Labs remarkable for reassuring labs, negative d-dimer -EKG interpreted by me showing NSR -imaging independently viewed and interpreted by me and I agree with radiologist's interpretation.  Result remarkable for normal CXR -DDx: gastritis, GERD, PE, pleural effusion, pna, ptx, acs -treatment includes GI cocktail with minimal improvement, morphine with some ipmrovement -PCP office notes or outside notes reviewed -Escalation to admission/observation considered: patients feels much better, is comfortable with discharge, and will follow up with cardiology -Prescription medication considered, patient comfortable with tylenol -Social Determinant of Health considered which includes language barrier         Final Clinical Impression(s) / ED Diagnoses Final diagnoses:  Atypical chest pain    Rx / DC Orders ED Discharge Orders           Ordered    acetaminophen (TYLENOL) 500 MG tablet  Every 6 hours PRN        04/01/22 2026  Domenic Moras, PA-C 04/01/22 2029    Ezequiel Essex, MD 04/02/22 [phone]

## 2022-04-01 NOTE — ED Triage Notes (Signed)
Patient here with complaint of chest pain and diarrhea that started approximately one week ago. Patient denies nausea, denies shortness of breath. Patient is alert, oriented, and in no apparent distress at this time.

## 2022-04-01 NOTE — Progress Notes (Signed)
LLE venous duplex has been completed.  Preliminary results messaged to Dr. Wyvonnia Dusky.   Results can be found under chart review under CV PROC. 04/01/2022 5:14 PM Vikrant Pryce RVT, RDMS for Darlin Coco RVT, RDMS

## 2022-04-01 NOTE — ED Notes (Signed)
Patient updated on plan of care.  Medicated for chest pain per order

## 2022-04-01 NOTE — Progress Notes (Signed)
NEUROLOGY CONSULTATION NOTE  Shannon Myers MRN: [id number] DOB: 07/25/1985  Referring provider: Theressa Stamps, PA Primary care provider: none listed  Reason for consult:  dizziness   Thank you for your kind referral of [place] for consultation of the above symptoms. Although her history is well known to you, please allow me to reiterate it for the purpose of our medical record. She is alone in the office. A Spanish medical interpreter Shannon Myers is present to help with translation. Records and images were personally reviewed where available.    HISTORY OF PRESENT ILLNESS: This is a pleasant 36 year old right-handed woman with no significant past medical history, no PCP, in her usual state of health until 03/24/22 when she had a syncopal episode. She was feeling fine that day, she ate her usual breakfast, brought her son to work, got home, then recalls her vision going dark (denies blurred vision), then waking up on the floor in a sitting position with pain in her ankle. She has no recollection of the fall. When she came to, she felt cold, sweaty, with chest pain and shortness of breath. The next day, she started having a headache and pain in different parts of her body. She went to the ER on 10/26 where she she had normal bloodwork except for K 3.3. I personally reviewed head CT without contrast which did not show any acute changes. EKG normal sinus rhythm. Per notes, "Based on patient's presentation with headache, vertigo, vision blurring, and syncopal episode I do have suspicion this could be related to IIH." [name] for evaluation reporting constant 10/10 chest pain since she fell. She feels a constant pressure like something is tight. She denies any tenderness to palpation. She would be sitting and start sweating and feeling cold. For the past 4 days, she reports stomach issues, every time she eats she has diarrhea and feels nauseated. She denies any vision loss or blurred  vision, her only vision symptom is seeing stars when bending down. She has had pain in her neck radiating down her back since the fall. She has had headaches that come and go on a daily basis since the fall, with a sharp pain on the left temporal region. She has been taking Tylenol daily for the past 2 weeks, the headache then goes away. No nausea/vomiting with the headaches. She denies any tinnitus. She denies any dizziness/lightheadedness, "just pressure in her chest." [name] and denies any sleep deprivation or alcohol use. She denies any staring/unresponsive episodes. She lives with her husband and children.    PAST MEDICAL HISTORY: Past Medical History:  Diagnosis Date   Anemia    Group B streptococcal bacteriuria 03/23/2021   History of preterm delivery, currently pregnant 10/08/2020   Preterm labor     PAST SURGICAL HISTORY: Past Surgical History:  Procedure Laterality Date   NO PAST SURGERIES      MEDICATIONS: Current Outpatient Medications on File Prior to Visit  Medication Sig Dispense Refill   acetaminophen (TYLENOL) 325 MG tablet Take 2 tablets (650 mg total) by mouth every 4 (four) hours as needed (for pain scale < 4). 60 tablet 0   ibuprofen (ADVIL) 600 MG tablet Take 1 tablet (600 mg total) by mouth every 6 (six) hours. 30 tablet 0   No current facility-administered medications on file prior to visit.    ALLERGIES: No Known Allergies  FAMILY HISTORY: Family History  Problem Relation Age of  Onset   Diabetes Mother    Hypertension Mother    Healthy Father    Alcohol abuse Neg Hx    Asthma Neg Hx    Arthritis Neg Hx    Birth defects Neg Hx    Cancer Neg Hx    COPD Neg Hx    Depression Neg Hx    Drug abuse Neg Hx    Early death Neg Hx    Hearing loss Neg Hx    Heart disease Neg Hx    Hyperlipidemia Neg Hx    Kidney disease Neg Hx    Learning disabilities Neg Hx    Mental illness Neg Hx    Mental retardation Neg Hx     Miscarriages / Stillbirths Neg Hx    Stroke Neg Hx    Vision loss Neg Hx     SOCIAL HISTORY: Social History   Socioeconomic History   Marital status: Significant Other    Spouse name: Not on file   Number of children: Not on file   Years of education: Not on file   Highest education level: Not on file  Occupational History   Not on file  Tobacco Use   Smoking status: Never   Smokeless tobacco: Never  Vaping Use   Vaping Use: Never used  Substance and Sexual Activity   Alcohol use: No   Drug use: No   Sexual activity: Yes    Birth control/protection: Implant  Other Topics Concern   Not on file  Social History Narrative   Are you right handed or left handed? Right handed    Are you currently employed ? No   What is your current occupation? NA   Do you live at home alone? NO   Who lives with you?  Lives with family    What type of home do you live in: 1 story or 2 story? 1 story        Social Determinants of Health   Financial Resource Strain: Not on file  Food Insecurity: No Food Insecurity (03/31/2021)   Hunger Vital Sign    Worried About Running Out of Food in the Last Year: Never true    Ran Out of Food in the Last Year: Never true  Transportation Needs: Unmet Transportation Needs (03/31/2021)   PRAPARE - Administrator, Civil Service (Medical): Yes    Lack of Transportation (Non-Medical): No  Physical Activity: Not on file  Stress: Not on file  Social Connections: Not on file  Intimate Partner Violence: Not on file     PHYSICAL EXAM: Vitals:   04/01/22 1246  BP: 125/80  Pulse: 92  SpO2: 99%   General: No acute distress Head:  Normocephalic/atraumatic Skin/Extremities: No rash, no edema Neurological Exam: Mental status: alert and awake, no dysarthria or aphasia, Fund of knowledge is appropriate.  Attention and concentration are normal.    Cranial nerves: CN I: not tested CN II: pupils equal, round, visual fields intact CN III, IV, VI:   full range of motion, no nystagmus, no ptosis CN V: facial sensation intact CN VII: upper and lower face symmetric CN VIII: hearing intact to conversation CN XI: sternocleidomastoid and trapezius muscles intact CN XII: tongue midline Bulk & Tone: normal, no fasciculations. Motor: 5/5 throughout with no pronator drift. Sensation: intact to light touch, cold, pin, vibration sense.  No extinction to double simultaneous stimulation.  Romberg test negative Deep Tendon Reflexes: +1 throughout Cerebellar: no incoordination on finger to nose testing  Gait: slow and cautious, reporting neck and back pain, no ataxia Tremor: none   IMPRESSION: This is a pleasant 36 year old right-handed woman with no significant past medical history, no PCP, in her usual state of health until a syncopal episode on 03/24/22. Since then, she has had constant "10/10" chest pressure. She was referred to Neurology due to report of "headache, vertigo, vision blurring, and syncopal episode, suspicion this could be related to IIH." She denies any vision changes or vertigo. She has had headaches since the fall, likely due to head injury. Head CT normal. Her neurological exam is normal, there is no indication of IIH. Main concern today is the syncopal episode with constant chest pain since then. She will be referred to Cardiology for syncope/chest pain, but since she reports 10/10 chest pain, she was instructed to go to the ER for further evaluation. Follow-up as needed, call for any changes.   Thank you for allowing me to participate in the care of this patient. Please do not hesitate to call for any questions or concerns.   Patrcia Dolly, M.D.  CC: Melton Alar, PA

## 2022-04-01 NOTE — ED Notes (Signed)
Patient calling for ride for discharge at this time

## 2022-04-01 NOTE — Patient Instructions (Addendum)
Good to meet you.  Since you are having 10/10 chest pain and lost consciousness a week ago, please go to the ER for evaluation  2. Referral will be sent to Cardiology  3. Follow-up as needed, call for any changes    Bueno conocerte.  1. Dado que tiene dolor en el pecho 10/10 y perdi el conocimiento hace una semana, vaya a la sala de emergencias para una evaluacin.  2. Se enviar derivacin a Cardiologa.  3. Haga un seguimiento segn sea necesario, llame para cualquier cambio.

## 2022-04-01 NOTE — ED Provider Triage Note (Signed)
Emergency Medicine Provider Triage Evaluation Note  Shannon Myers , a 36 y.o. female  was evaluated in triage.  Pt complains of chest pain and diarrhea for the last 1 week.  Patient also reporting shortness of breath as well as increased pain with exertion.  The patient denies any history of cardiac events.  Patient also reporting that she has diarrhea however denies any abdominal pain.  Patient also reporting nausea without vomiting.  Patient denies fevers.  Review of Systems  Positive:  Negative:   Physical Exam  BP 131/81 (BP Location: Right Arm)   Pulse 92   Temp 99.7 F (37.6 C) (Oral)   Resp 16   SpO2 98%  Gen:   Awake, no distress   Resp:  Normal effort  MSK:   Moves extremities without difficulty  Other:    Medical Decision Making  Medically screening exam initiated at 2:19 PM.  Appropriate orders placed.  [place] was informed that the remainder of the evaluation will be completed by another provider, this initial triage assessment does not replace that evaluation, and the importance of remaining in the ED until their evaluation is complete.     Azucena Cecil, PA-C 04/01/22 1420

## 2022-04-22 ENCOUNTER — Ambulatory Visit: Payer: 59 | Admitting: Neurology

## 2022-05-22 ENCOUNTER — Encounter: Payer: Self-pay | Admitting: *Deleted

## 2023-06-02 NOTE — L&D Delivery Note (Signed)
 OB/GYN Delivery Note  Shannon Myers is a 38 y.o. [name] s/p SVD at [redacted]w[redacted]d. She was admitted for PROM.   ROM: 9h 27m with clear fluid GBS Status: positive s/p adequate tx Maximum Maternal Temperature: 99.4  Labor Progress: Presented at 2cm dilated, progressed to complete  Delivery Date/Time: [phone] Delivery: Called to room and patient was complete and pushing. Head delivered right occiput anterior. nuchal cord absent. Shoulder and body delivered in usual fashion. Infant with spontaneous cry, placed on mother's abdomen, dried and stimulated. Cord clamped x 2 after 1-minute delay, and cut by father. Cord blood drawn. Placenta delivered spontaneously with gentle cord traction. Fundus firm with massage and Pitocin . Labia, perineum, vagina, and cervix inspected and no lacerations were appreciated.   Placenta:  spontaneous Intact Complications:none Lacerations: none EBL: 200 Analgesia: Epidural  Newborn Data: Birth date:05/11/2024 Birth time:9:29 AM Gender:Female Living status:Living Apgars:8 ,9  Weight:            Paralee JONELLE Carpen, MD Family Medicine PGY-1  05/11/2024 9:52 AM

## 2023-11-19 ENCOUNTER — Encounter (HOSPITAL_COMMUNITY): Payer: Self-pay | Admitting: *Deleted

## 2023-11-19 ENCOUNTER — Inpatient Hospital Stay (HOSPITAL_COMMUNITY): Payer: Self-pay

## 2023-11-19 ENCOUNTER — Inpatient Hospital Stay (HOSPITAL_COMMUNITY)
Admission: AD | Admit: 2023-11-19 | Discharge: 2023-11-19 | Disposition: A | Discharge status: Home / Self Care | Payer: Self-pay | Attending: Obstetrics and Gynecology | Admitting: Obstetrics and Gynecology

## 2023-11-19 ENCOUNTER — Other Ambulatory Visit: Payer: Self-pay

## 2023-11-19 DIAGNOSIS — R519 Headache, unspecified: Secondary | ICD-10-CM

## 2023-11-19 DIAGNOSIS — M79662 Pain in left lower leg: Secondary | ICD-10-CM

## 2023-11-19 DIAGNOSIS — O26891 Other specified pregnancy related conditions, first trimester: Secondary | ICD-10-CM

## 2023-11-19 DIAGNOSIS — M25562 Pain in left knee: Secondary | ICD-10-CM | POA: Insufficient documentation

## 2023-11-19 DIAGNOSIS — M25561 Pain in right knee: Secondary | ICD-10-CM | POA: Insufficient documentation

## 2023-11-19 DIAGNOSIS — Z603 Acculturation difficulty: Secondary | ICD-10-CM | POA: Insufficient documentation

## 2023-11-19 DIAGNOSIS — M79661 Pain in right lower leg: Secondary | ICD-10-CM

## 2023-11-19 DIAGNOSIS — Z3A13 13 weeks gestation of pregnancy: Secondary | ICD-10-CM

## 2023-11-19 DIAGNOSIS — O99891 Other specified diseases and conditions complicating pregnancy: Secondary | ICD-10-CM

## 2023-11-19 DIAGNOSIS — M545 Low back pain, unspecified: Secondary | ICD-10-CM | POA: Insufficient documentation

## 2023-11-19 DIAGNOSIS — M549 Dorsalgia, unspecified: Secondary | ICD-10-CM

## 2023-11-19 LAB — COMPREHENSIVE METABOLIC PANEL WITH GFR
ALT: 34 U/L (ref 0–44)
AST: 32 U/L (ref 15–41)
Albumin: 3.4 g/dL — ABNORMAL LOW (ref 3.5–5.0)
Alkaline Phosphatase: 37 U/L — ABNORMAL LOW (ref 38–126)
Anion gap: 10 (ref 5–15)
BUN: <5 mg/dL — ABNORMAL LOW (ref 6–20)
CO2: 20 mmol/L — ABNORMAL LOW (ref 22–32)
Calcium: 8.6 mg/dL — ABNORMAL LOW (ref 8.9–10.3)
Chloride: 108 mmol/L (ref 98–111)
Creatinine, Ser: 0.53 mg/dL (ref 0.44–1.00)
GFR, Estimated: >60 mL/min (ref >60)
Glucose, Bld: 90 mg/dL (ref 70–99)
Potassium: 3.6 mmol/L (ref 3.5–5.1)
Sodium: 138 mmol/L (ref 135–145)
Total Bilirubin: 1.3 mg/dL — ABNORMAL HIGH (ref 0.0–1.2)
Total Protein: 7.1 g/dL (ref 6.5–8.1)

## 2023-11-19 LAB — DIFFERENTIAL
Abs Immature Granulocytes: 0.07 10*3/uL (ref 0.00–0.07)
Basophils Absolute: 0 10*3/uL (ref 0.0–0.1)
Basophils Relative: 0 %
Eosinophils Absolute: 0.1 10*3/uL (ref 0.0–0.5)
Eosinophils Relative: 2 %
Immature Granulocytes: 1 %
Lymphocytes Relative: 30 %
Lymphs Abs: 2.6 10*3/uL (ref 0.7–4.0)
Monocytes Absolute: 0.7 10*3/uL (ref 0.1–1.0)
Monocytes Relative: 8 %
Neutro Abs: 5.3 10*3/uL (ref 1.7–7.7)
Neutrophils Relative %: 59 %

## 2023-11-19 LAB — URINALYSIS, ROUTINE W REFLEX MICROSCOPIC
Bilirubin Urine: NEGATIVE
Glucose, UA: NEGATIVE mg/dL
Hgb urine dipstick: NEGATIVE
Ketones, ur: NEGATIVE mg/dL
Leukocytes,Ua: NEGATIVE
Nitrite: NEGATIVE
Protein, ur: NEGATIVE mg/dL
Specific Gravity, Urine: 1.015 (ref 1.005–1.030)
pH: 8 (ref 5.0–8.0)

## 2023-11-19 LAB — CBC
HCT: 35.5 % — ABNORMAL LOW (ref 36.0–46.0)
Hemoglobin: 12.8 g/dL (ref 12.0–15.0)
MCH: 30.6 pg (ref 26.0–34.0)
MCHC: 36.1 g/dL — ABNORMAL HIGH (ref 30.0–36.0)
MCV: 84.9 fL (ref 80.0–100.0)
Platelets: 213 10*3/uL (ref 150–400)
RBC: 4.18 MIL/uL (ref 3.87–5.11)
RDW: 12.6 % (ref 11.5–15.5)
WBC: 8.7 10*3/uL (ref 4.0–10.5)
nRBC: 0 % (ref 0.0–0.2)

## 2023-11-19 LAB — TYPE AND SCREEN
ABO/RH(D): O POS
Antibody Screen: NEGATIVE

## 2023-11-19 LAB — OB RESULTS CONSOLE VARICELLA ZOSTER ANTIBODY, IGG: Varicella: IMMUNE

## 2023-11-19 LAB — PROTEIN / CREATININE RATIO, URINE
Creatinine, Urine: 122 mg/dL
Protein Creatinine Ratio: 0.06 mg/mg{creat} (ref 0.00–0.15)
Total Protein, Urine: 7 mg/dL

## 2023-11-19 LAB — OB RESULTS CONSOLE RUBELLA ANTIBODY, IGM: Rubella: IMMUNE

## 2023-11-19 LAB — OB RESULTS CONSOLE RPR: RPR: NONREACTIVE

## 2023-11-19 LAB — OB RESULTS CONSOLE HEPATITIS B SURFACE ANTIGEN: Hepatitis B Surface Ag: NEGATIVE

## 2023-11-19 LAB — OB RESULTS CONSOLE ANTIBODY SCREEN: Antibody Screen: NEGATIVE

## 2023-11-19 LAB — OB RESULTS CONSOLE ABO/RH: RH Type: POSITIVE

## 2023-11-19 LAB — POCT PREGNANCY, URINE: Preg Test, Ur: POSITIVE — AB

## 2023-11-19 LAB — OB RESULTS CONSOLE PLATELET COUNT: Platelets: 213

## 2023-11-19 LAB — OB RESULTS CONSOLE HIV ANTIBODY (ROUTINE TESTING): HIV: NONREACTIVE

## 2023-11-19 LAB — HIV ANTIBODY (ROUTINE TESTING W REFLEX): HIV Screen 4th Generation wRfx: NONREACTIVE

## 2023-11-19 LAB — HEPATITIS B SURFACE ANTIGEN: Hepatitis B Surface Ag: NONREACTIVE

## 2023-11-19 MED ORDER — CYCLOBENZAPRINE HCL 5 MG PO TABS
10.0000 mg | ORAL_TABLET | Freq: Once | ORAL | Status: AC
  Administered 2023-11-19: 10 mg via ORAL
  Filled 2023-11-19: qty 2

## 2023-11-19 MED ORDER — ACETAMINOPHEN-CAFFEINE 500-65 MG PO TABS
2.0000 | ORAL_TABLET | Freq: Once | ORAL | Status: AC
  Administered 2023-11-19: 2 via ORAL
  Filled 2023-11-19: qty 2

## 2023-11-19 MED ORDER — ACETAMINOPHEN-CAFFEINE 500-65 MG PO TABS: 2.0000 | ORAL_TABLET | Freq: Four times a day (QID) | ORAL | 0 refills | Status: DC | PRN

## 2023-11-19 MED ORDER — PRENATAL VITAMINS 27-0.8 MG PO TABS: 1.0000 | ORAL_TABLET | Freq: Every day | ORAL | 0 refills | Status: AC

## 2023-11-19 MED ORDER — CYCLOBENZAPRINE HCL 10 MG PO TABS: 10.0000 mg | ORAL_TABLET | Freq: Two times a day (BID) | ORAL | 0 refills | Status: DC | PRN

## 2023-11-19 MED ORDER — METOCLOPRAMIDE HCL 10 MG PO TABS: 10.0000 mg | ORAL_TABLET | Freq: Four times a day (QID) | ORAL | 0 refills | Status: DC | PRN

## 2023-11-19 MED ORDER — METOCLOPRAMIDE HCL 10 MG PO TABS
10.0000 mg | ORAL_TABLET | Freq: Once | ORAL | Status: AC
  Administered 2023-11-19: 10 mg via ORAL
  Filled 2023-11-19: qty 1

## 2023-11-19 NOTE — MAU Note (Signed)
 Shannon Myers is a 38 y.o. at Unknown here in MAU reporting: she is having lower back pain that began two weeks ago, but pain has worsened.  Also states she has bilateral knee and upper leg pain for the past week.  States she also currently has a HA, has been taking Tylenol  without any relief.  Reports last took Tylenol  this morning at 1000.  Denies VB.  LMP: 05/11/2023 normal  thinks 3-4 months preg Onset of complaint: 2 weeks Pain score: 10  Vitals:   11/19/23 1609  BP: 135/74  Pulse: 90  Resp: 18  Temp: 98.5 F (36.9 C)  SpO2: 100%     FHT: NA  Lab orders placed from triage: UPT

## 2023-11-19 NOTE — Discharge Instructions (Signed)
 Safe Medications in Pregnancy   Acne:  Benzoyl Peroxide  Salicylic Acid   Backache/Headache:  Tylenol : 2 regular strength every 4 hours OR               2 Extra strength every 6 hours   Colds/Coughs/Allergies:  Benadryl  (alcohol free) 25 mg every 6 hours as needed  Breath right strips  Claritin  Cepacol throat lozenges  Chloraseptic throat spray  Cold-Eeze- up to three times per day  Cough drops, alcohol free  Flonase (by prescription only)  Guaifenesin  Mucinex  Robitussin DM (plain only, alcohol free)  Saline nasal spray/drops  Sudafed (pseudoephedrine) & Actifed * use only after [redacted] weeks gestation and if you do not have high blood pressure  Tylenol   Vicks Vaporub  Zinc lozenges  Zyrtec   Constipation:  Colace  Ducolax suppositories  Fleet enema  Glycerin  suppositories  Metamucil  Milk of magnesia  Miralax  Senokot  Smooth move tea   Diarrhea:  Kaopectate  Imodium A-D   *NO pepto Bismol   Hemorrhoids:  Anusol  Anusol HC  Preparation H  Tucks   Indigestion:  Tums  Maalox  Mylanta  Zantac  Pepcid   Insomnia:  Benadryl  (alcohol free) 25mg  every 6 hours as needed  Tylenol  PM  Unisom, no Gelcaps   Leg Cramps:  Tums  MagGel   Nausea/Vomiting:  Bonine  Dramamine  Emetrol  Ginger extract  Sea bands  Meclizine  Nausea medication to take during pregnancy:  Unisom (doxylamine succinate 25 mg tablets) Take one tablet daily at bedtime. If symptoms are not adequately controlled, the dose can be increased to a maximum recommended dose of two tablets daily (1/2 tablet in the morning, 1/2 tablet mid-afternoon and one at bedtime).  Vitamin B6 100mg  tablets. Take one tablet twice a day (up to 200 mg per day).   Skin Rashes:  Aveeno products  Benadryl  cream or 25mg  every 6 hours as needed  Calamine Lotion  1% cortisone cream   Yeast infection:  Gyne-lotrimin 7  Monistat 7    **If taking multiple medications, please check labels to avoid  duplicating the same active ingredients  **take medication as directed on the label  ** Do not exceed 4000 mg of tylenol  in 24 hours  **Do not take medications that contain aspirin or ibuprofen            [employer] [facility] [employer] [facility] [employer] at [employer] [facility]    Phone: [phone]  [facility] [employer] at [place]   Phone: [phone]  [facility] [employer] at [place]  Phone: [phone]  [facility] [employer] at [employer]  Phone: [phone]  [facility] [employer] at [place]  Phone: [phone]  [facility] at [facility]   Phone: [phone]  [facility]       Phone: [phone]  [facility]    Phone: [phone]   [facility]    Phone: [phone]  [facility]    Phone: [phone]  [facility]    Phone: [phone]  [facility]       Phone: [phone]   [facility]  Phone: [phone]  [facility]    Phone: [phone]  [facility] [place] [facility]   Phone: [phone]  [facility]      Phone: [phone]  [facility]  Phone: [phone]

## 2023-11-19 NOTE — MAU Provider Note (Signed)
 Chief Complaint:  Back Pain, Headache, and Emesis   HPI    [facility] SPANISH INTERPRETER PRESENT DURING THE ENTIRE ENCOUNTER  Shannon Myers is a 38 y.o. [phone] at Unknown who presents to maternity admissions reporting she has had lower back pain x [redacted] weeks along with B/L knee pain and a HA that is not resolving with Tylenol  after taking it at 1000. She describes the pain as 10/10 in her Head.  She reports she has not established [facility] yet and is unsure of her dates. Her LMP was 05/10/24 per patient. She denies VB, LOF, and reports that she feels fetal movements.   Pregnancy Course: Un established   Past Medical History:  Diagnosis Date   Anemia    Group B streptococcal bacteriuria 03/23/2021   History of preterm delivery, currently pregnant 10/08/2020   Preterm labor    OB History  Gravida Para Term Preterm AB Living  10 8 7 1 1 8   SAB IAB Ectopic Multiple Live Births  1   1 8     # Outcome Date GA Lbr Len/2nd Weight Sex Type Anes PTL Lv  10A Gravida           10B Current           9 Term 05/06/21 [redacted]w[redacted]d 06:16 3290 g F Vag-Spont EPI  LIV     Birth Comments: WNL  8 SAB 07/2020          7 Term 01/09/17 [redacted]w[redacted]d    Vag-Spont   LIV  6 Term 11/12/14 [redacted]w[redacted]d  2940 g M Vag-Spont None  LIV     Birth Comments: none- precepitous delivery- delivered in hallway  5 Term 02/02/13 [redacted]w[redacted]d 07:32 / 00:26 2770 g F Vag-Spont None  LIV     Birth Comments: anemia  4 Term 03/11/12 105w0d   M Vag-Spont EPI  LIV  3 Term 07/2006 [redacted]w[redacted]d   M Vag-Spont EPI  LIV     Birth Comments: got weekly shots  to prevent early delivery  2 Preterm 09/2003 [redacted]w[redacted]d   M Vag-Spont EPI Y LIV     Birth Comments: Natale Bail was in NICU  1 Term 05/2001 [redacted]w[redacted]d  2268 g     LIV     Birth Comments: no complications, born in [place]   Past Surgical History:  Procedure Laterality Date   NO PAST SURGERIES     Family History  Problem Relation Age of Onset   Diabetes Mother    Hypertension Mother    Healthy Father    Alcohol abuse Neg Hx     Asthma Neg Hx    Arthritis Neg Hx    Birth defects Neg Hx    Cancer Neg Hx    COPD Neg Hx    Depression Neg Hx    Drug abuse Neg Hx    Early death Neg Hx    Hearing loss Neg Hx    Heart disease Neg Hx    Hyperlipidemia Neg Hx    Kidney disease Neg Hx    Learning disabilities Neg Hx    Mental illness Neg Hx    Mental retardation Neg Hx    Miscarriages / Stillbirths Neg Hx    Stroke Neg Hx    Vision loss Neg Hx    Social History   Tobacco Use   Smoking status: Never   Smokeless tobacco: Never  Vaping Use   Vaping status: Never Used  Substance Use Topics   Alcohol use: No   Drug  use: No   No Known Allergies No medications prior to admission.    I have reviewed patient's Past Medical Hx, Surgical Hx, Family Hx, Social Hx, medications and allergies.   ROS  Pertinent items noted in HPI and remainder of comprehensive ROS otherwise negative.   PHYSICAL EXAM  Patient Vitals for the past 24 hrs:  BP Temp Temp src Pulse Resp SpO2 Height Weight  11/19/23 1609 135/74 98.5 F (36.9 C) Oral 90 18 100 % -- --  11/19/23 1559 -- -- -- -- -- -- 4' 11 (1.499 m) 96.4 kg    Constitutional: Well-developed, obese female in no acute distress.  Cardiovascular: normal rate & rhythm, warm and well-perfused Respiratory: normal effort, no problems with respiration noted GI: Abd soft, non-tender, gravid MS: Extremities nontender, no edema, normal ROM Neurologic: Alert and oriented x 4.  GU: no CVA tenderness Pelvic: Deferred      Fetal Heart Rate via Ultrasound 161 bpm    Labs: Results for orders placed or performed during the hospital encounter of 11/19/23 (from the past 24 hours)  Pregnancy, urine POC     Status: Abnormal   Collection Time: 11/19/23  4:26 PM  Result Value Ref Range   Preg Test, Ur POSITIVE (A) NEGATIVE  Urinalysis, Routine w reflex microscopic -Urine, Clean Catch     Status: Abnormal   Collection Time: 11/19/23  4:30 PM  Result Value Ref Range   Color, Urine  YELLOW YELLOW   APPearance CLOUDY (A) CLEAR   Specific Gravity, Urine 1.015 1.005 - 1.030   pH 8.0 5.0 - 8.0   Glucose, UA NEGATIVE NEGATIVE mg/dL   Hgb urine dipstick NEGATIVE NEGATIVE   Bilirubin Urine NEGATIVE NEGATIVE   Ketones, ur NEGATIVE NEGATIVE mg/dL   Protein, ur NEGATIVE NEGATIVE mg/dL   Nitrite NEGATIVE NEGATIVE   Leukocytes,Ua NEGATIVE NEGATIVE  Type and screen [facility]     Status: None   Collection Time: 11/19/23  5:16 PM  Result Value Ref Range   ABO/RH(D) O POS    Antibody Screen NEG    Sample Expiration      11/22/2023,2359 Performed at [facility], [address], [place], [place] [address]   Hepatitis B surface antigen     Status: None   Collection Time: 11/19/23  5:21 PM  Result Value Ref Range   Hepatitis B Surface Ag NON REACTIVE NON REACTIVE  CBC     Status: Abnormal   Collection Time: 11/19/23  5:21 PM  Result Value Ref Range   WBC 8.7 4.0 - 10.5 K/uL   RBC 4.18 3.87 - 5.11 MIL/uL   Hemoglobin 12.8 12.0 - 15.0 g/dL   HCT 60.4 (L) 54.0 - 98.1 %   MCV 84.9 80.0 - 100.0 fL   MCH 30.6 26.0 - 34.0 pg   MCHC 36.1 (H) 30.0 - 36.0 g/dL   RDW 19.1 47.8 - 29.5 %   Platelets 213 150 - 400 K/uL   nRBC 0.0 0.0 - 0.2 %  Differential     Status: None   Collection Time: 11/19/23  5:21 PM  Result Value Ref Range   Neutrophils Relative % 59 %   Neutro Abs 5.3 1.7 - 7.7 K/uL   Lymphocytes Relative 30 %   Lymphs Abs 2.6 0.7 - 4.0 K/uL   Monocytes Relative 8 %   Monocytes Absolute 0.7 0.1 - 1.0 K/uL   Eosinophils Relative 2 %   Eosinophils Absolute 0.1 0.0 - 0.5 K/uL  Basophils Relative 0 %   Basophils Absolute 0.0 0.0 - 0.1 K/uL   Immature Granulocytes 1 %   Abs Immature Granulocytes 0.07 0.00 - 0.07 K/uL  HIV Antibody (routine testing w rflx)     Status: None   Collection Time: 11/19/23  5:21 PM  Result Value Ref Range   HIV Screen 4th Generation wRfx Non Reactive Non Reactive  Comprehensive metabolic panel     Status: Abnormal    Collection Time: 11/19/23  5:21 PM  Result Value Ref Range   Sodium 138 135 - 145 mmol/L   Potassium 3.6 3.5 - 5.1 mmol/L   Chloride 108 98 - 111 mmol/L   CO2 20 (L) 22 - 32 mmol/L   Glucose, Bld 90 70 - 99 mg/dL   BUN <5 (L) 6 - 20 mg/dL   Creatinine, Ser 1.61 0.44 - 1.00 mg/dL   Calcium  8.6 (L) 8.9 - 10.3 mg/dL   Total Protein 7.1 6.5 - 8.1 g/dL   Albumin 3.4 (L) 3.5 - 5.0 g/dL   AST 32 15 - 41 U/L   ALT 34 0 - 44 U/L   Alkaline Phosphatase 37 (L) 38 - 126 U/L   Total Bilirubin 1.3 (H) 0.0 - 1.2 mg/dL   GFR, Estimated >09 >60 mL/min   Anion gap 10 5 - 15    Imaging:  US  OB Comp Less 14 Wks Result Date: 11/19/2023 CLINICAL DATA:  Lower back pain and leg pain with unknown dates. EXAM: OBSTETRIC <14 WK US  AND TRANSVAGINAL OB US  TECHNIQUE: Both transabdominal and transvaginal ultrasound examinations were performed for complete evaluation of the gestation as well as the maternal uterus, adnexal regions, and pelvic cul-de-sac. Transvaginal technique was performed to assess early pregnancy. COMPARISON:  None Available. FINDINGS: Intrauterine gestational sac: Single Yolk sac:  Not Visualized. Embryo:  Visualized. Cardiac Activity: Visualized. Heart Rate: 161 bpm CRL: 71.5 mm 13 w 2 d US  EDC: May 23, 2024 Subchorionic hemorrhage:  None visualized. Maternal uterus/adnexae: The right and left ovaries are not clearly visualized. No pelvic free fluid is seen. IMPRESSION: Single, viable intrauterine pregnancy at approximately 13 weeks and 2 days gestation by ultrasound evaluation. Electronically Signed   By: Virgle Grime M.D.   On: 11/19/2023 19:33    MDM & MAU COURSE  MDM:  HIGH  Abdominal pain in pregnancy No Prenatal Care ( OB /PNL ordered) HA in pregnancy ( BP normotensive, will give Excedrin, Reglan and Flexeril - Relief noted) Muscular skeletal pain in pregnancy ( Tylenol /Flexeril  ordered- Relief Noted)  CBC: Unremarkable ABO: O Positive OB Ultrasound:  UA: no evidence of  UTI    Differential diagnosis considered for abdominal pain in pregnancy is not limited to: ectopic pregnancy, complete spontaneous abortion, incomplete abortion, missed abortion, threatened abortion, embryonic/fetal demise, cervical insufficiency, cervical or vaginal disorder     MAU Course: Orders Placed This Encounter  Procedures   US  OB Comp Less 14 Wks   Urinalysis, Routine w reflex microscopic -Urine, Clean Catch   Hepatitis B surface antigen   Rubella screen   RPR   CBC   Differential   HIV Antibody (routine testing w rflx)   Comprehensive metabolic panel   Protein / creatinine ratio, urine   Pregnancy, urine POC   Type and screen [facility]   Discharge patient Discharge disposition: 01-Home or Self Care; Discharge patient date: 11/19/2023   Meds ordered this encounter  Medications   cyclobenzaprine  (FLEXERIL ) tablet 10 mg   metoCLOPramide (REGLAN) tablet 10 mg  acetaminophen -caffeine (EXCEDRIN TENSION HEADACHE) 500-65 MG per tablet 2 tablet   Prenatal Vit-Fe Fumarate-FA (PRENATAL VITAMINS) 27-0.8 MG TABS    Sig: Take 1 capsule by mouth daily.    Dispense:  30 each    Refill:  0    Supervising Provider:   PRATT, TANYA S [[id number]]   cyclobenzaprine  (FLEXERIL ) 10 MG tablet    Sig: Take 1 tablet (10 mg total) by mouth 2 (two) times daily as needed for muscle spasms.    Dispense:  30 tablet    Refill:  0    Supervising Provider:   PRATT, TANYA S [[id number]]   metoCLOPramide (REGLAN) 10 MG tablet    Sig: Take 1 tablet (10 mg total) by mouth every 6 (six) hours as needed for nausea.    Dispense:  30 tablet    Refill:  0    Supervising Provider:   PRATT, TANYA S [[id number]]   acetaminophen -caffeine (EXCEDRIN TENSION HEADACHE) 500-65 MG TABS per tablet    Sig: Take 2 tablets by mouth 4 (four) times daily as needed (Foe headaches).    Dispense:  60 tablet    Refill:  0    Supervising Provider:   PRATT, TANYA S [[id number]]     US  OB Comp Less 14 Wks (Accession  [id number]) (Order [id number]) Imaging Date: 11/19/2023 Department: [facility] Released By: Arlyss Berkeley, RT Authorizing: Cherlynn Cornfield, NP   Exam Status  Status  Final [99]   PACS Intelerad Image Link   Show images for US  OB Comp Less 14 Wks Study Result  Narrative & Impression  CLINICAL DATA:  Lower back pain and leg pain with unknown dates.   EXAM: OBSTETRIC <14 WK US  AND TRANSVAGINAL OB US    TECHNIQUE: Both transabdominal and transvaginal ultrasound examinations were performed for complete evaluation of the gestation as well as the maternal uterus, adnexal regions, and pelvic cul-de-sac. Transvaginal technique was performed to assess early pregnancy.   COMPARISON:  None Available.   FINDINGS: Intrauterine gestational sac: Single   Yolk sac:  Not Visualized.   Embryo:  Visualized.   Cardiac Activity: Visualized.   Heart Rate: 161 bpm   CRL: 71.5 mm 13 w 2 d US  EDC: May 23, 2024   Subchorionic hemorrhage:  None visualized.   Maternal uterus/adnexae: The right and left ovaries are not clearly visualized.   No pelvic free fluid is seen.   IMPRESSION: Single, viable intrauterine pregnancy at approximately 13 weeks and 2 days gestation by ultrasound evaluation.     Electronically Signed   By: Virgle Grime M.D.   On: 11/19/2023 19:33   I have reviewed the patient chart and performed the physical exam . I have ordered & interpreted the lab results and reviewed and interpreted the ultrasound images and agree with the radiologist findings  Medications ordered as stated below.  A/P as described below.  Counseling and education provided and patient agreeable  with plan as described below. Verbalized understanding.    ASSESSMENT   1. Nonintractable headache, unspecified chronicity pattern, unspecified headache type   2. [redacted] weeks gestation of pregnancy   3. Back pain affecting pregnancy   4. Language barrier     PLAN   Discharge home in stable condition with return precautions.   Follow up options provided - List provided  See AVS for full description of information given to the patient including both verbal and written. Patient verbalized understanding and agrees with the plan as described above.  Allergies as of 11/19/2023   No Known Allergies      Medication List     STOP taking these medications    ibuprofen  600 MG tablet Commonly known as: ADVIL        TAKE these medications    acetaminophen  500 MG tablet Commonly known as: TYLENOL  Take 1 tablet (500 mg total) by mouth every 6 (six) hours as needed.   acetaminophen -caffeine 500-65 MG Tabs per tablet Commonly known as: EXCEDRIN TENSION HEADACHE Take 2 tablets by mouth 4 (four) times daily as needed (Foe headaches).   cyclobenzaprine  10 MG tablet Commonly known as: FLEXERIL  Take 1 tablet (10 mg total) by mouth 2 (two) times daily as needed for muscle spasms.   metoCLOPramide 10 MG tablet Commonly known as: REGLAN Take 1 tablet (10 mg total) by mouth every 6 (six) hours as needed for nausea.   Prenatal Vitamins 27-0.8 MG Tabs Take 1 capsule by mouth daily.        Debbe Fail, MSN, [facility], [facility] for [employer]

## 2023-11-20 LAB — RPR: RPR Ser Ql: NONREACTIVE

## 2023-11-21 LAB — RUBELLA SCREEN: Rubella: 2.25 {index} (ref >0.99)

## 2023-11-25 LAB — OB RESULTS CONSOLE GC/CHLAMYDIA
Chlamydia: NEGATIVE
Neisseria Gonorrhea: NEGATIVE

## 2023-11-25 LAB — HEPATITIS C ANTIBODY: HCV Ab: NEGATIVE

## 2023-11-25 LAB — OB RESULTS CONSOLE HGB/HCT, BLOOD
HCT: 36 (ref 29–41)
Hemoglobin: 12.8

## 2023-11-25 LAB — GLUCOSE TOLERANCE, 1 HOUR: Glucose, 1 Hour GTT: 74

## 2023-11-29 ENCOUNTER — Other Ambulatory Visit: Payer: Self-pay | Admitting: Obstetrics & Gynecology

## 2023-11-29 DIAGNOSIS — Z36 Encounter for antenatal screening for chromosomal anomalies: Secondary | ICD-10-CM

## 2023-12-22 DIAGNOSIS — O0943 Supervision of pregnancy with grand multiparity, third trimester: Secondary | ICD-10-CM | POA: Insufficient documentation

## 2023-12-22 DIAGNOSIS — O0942 Supervision of pregnancy with grand multiparity, second trimester: Secondary | ICD-10-CM | POA: Insufficient documentation

## 2023-12-30 ENCOUNTER — Other Ambulatory Visit: Payer: Self-pay

## 2023-12-30 ENCOUNTER — Encounter: Payer: Self-pay | Admitting: Obstetrics and Gynecology

## 2023-12-30 ENCOUNTER — Ambulatory Visit (INDEPENDENT_AMBULATORY_CARE_PROVIDER_SITE_OTHER): Payer: Self-pay | Admitting: Obstetrics and Gynecology

## 2023-12-30 VITALS — BP 105/65 | HR 93 | Wt 211.8 lb

## 2023-12-30 DIAGNOSIS — Z758 Other problems related to medical facilities and other health care: Secondary | ICD-10-CM

## 2023-12-30 DIAGNOSIS — O0992 Supervision of high risk pregnancy, unspecified, second trimester: Secondary | ICD-10-CM

## 2023-12-30 DIAGNOSIS — O99212 Obesity complicating pregnancy, second trimester: Secondary | ICD-10-CM

## 2023-12-30 DIAGNOSIS — Z603 Acculturation difficulty: Secondary | ICD-10-CM

## 2023-12-30 DIAGNOSIS — O09522 Supervision of elderly multigravida, second trimester: Secondary | ICD-10-CM

## 2023-12-30 DIAGNOSIS — O0990 Supervision of high risk pregnancy, unspecified, unspecified trimester: Secondary | ICD-10-CM | POA: Insufficient documentation

## 2023-12-30 DIAGNOSIS — Z6841 Body Mass Index (BMI) 40.0 and over, adult: Secondary | ICD-10-CM

## 2023-12-30 DIAGNOSIS — O99211 Obesity complicating pregnancy, first trimester: Secondary | ICD-10-CM

## 2023-12-30 DIAGNOSIS — Z3A19 19 weeks gestation of pregnancy: Secondary | ICD-10-CM

## 2023-12-30 DIAGNOSIS — O09899 Supervision of other high risk pregnancies, unspecified trimester: Secondary | ICD-10-CM

## 2023-12-30 DIAGNOSIS — O09523 Supervision of elderly multigravida, third trimester: Secondary | ICD-10-CM | POA: Insufficient documentation

## 2023-12-30 DIAGNOSIS — O0942 Supervision of pregnancy with grand multiparity, second trimester: Secondary | ICD-10-CM

## 2023-12-30 DIAGNOSIS — O09892 Supervision of other high risk pregnancies, second trimester: Secondary | ICD-10-CM

## 2023-12-30 NOTE — Progress Notes (Unsigned)
 Patient reports having 5-6 contractions a day

## 2023-12-31 DIAGNOSIS — Z6841 Body Mass Index (BMI) 40.0 and over, adult: Secondary | ICD-10-CM | POA: Insufficient documentation

## 2023-12-31 LAB — TSH RFX ON ABNORMAL TO FREE T4: TSH: 0.869 u[IU]/mL (ref 0.450–4.500)

## 2023-12-31 NOTE — Progress Notes (Signed)
 PRENATAL VISIT NOTE  Transfer of care note from [facility] for h/o 2005 PTB  Subjective:  Shannon Myers is a 38 y.o. [name] at [redacted]w[redacted]d being seen today for ongoing prenatal care.  She is currently monitored for the following issues for this high-risk pregnancy and has Obesity affecting pregnancy in first trimester; History of preterm delivery, currently pregnant; Language barrier; Grand multiparity with current pregnancy in second trimester; AMA (advanced maternal age) multigravida 35+, second trimester; Supervision of high risk pregnancy, antepartum; and BMI 40.0-44.9, adult (HCC) on their problem list.  Patient reports pelvic discomfort a few times a day.  Contractions: Irritability. Vag. Bleeding: None.  Movement: Present. Denies leaking of fluid.   The following portions of the patient's history were reviewed and updated as appropriate: allergies, current medications, past family history, past medical history, past social history, past surgical history and problem list.   Objective:    Vitals:   12/30/23 1455  BP: 105/65  Pulse: 93  Weight: 211 lb 12.8 oz (96.1 kg)    Fetal Status:  Fetal Heart Rate (bpm): 146   Movement: Present    General: Alert, oriented and cooperative. Patient is in no acute distress.  Skin: Skin is warm and dry. No rash noted.   Cardiovascular: Normal heart rate noted  Respiratory: Normal respiratory effort, no problems with respiration noted  Abdomen: Soft, gravid, appropriate for gestational age.  Pain/Pressure: Absent     Pelvic: Cervical exam performed in the presence of a chaperone Dilation: Closed Effacement (%): Thick Station: Ballotable Speculum exam negative  Extremities: Normal range of motion.  Edema: None  Mental Status: Normal mood and affect. Normal behavior. Normal judgment and thought content.   Assessment and Plan:  Pregnancy: [name] at [redacted]w[redacted]d 1. Supervision of high risk pregnancy, antepartum (Primary) New OB records reviewed and all  wnl. Patient confirms on low dose ASA F/u mfm anatomy u/s - AFP, Serum, Open Spina Bifida - TSH Rfx on Abnormal to Free T4  2. AMA (advanced maternal age) multigravida 35+, second trimester Low risk panorama.   3. Grand multiparity with current pregnancy in second trimester Consider Lysteda  at delivery  4. History of preterm delivery, currently pregnant Exam negative today Patient was on 17p for her subsequent term SVDs after her 2005 PTB. I told her that 17p has been discontinued due to lack of efficacy. I told her that another potential option is vaginal progesterone but unknown if this is as good or inferior to weekly 17p. I didn't mention compounding pharmacy, but I know this was difficult to find in patients after Makena came out and unknown if this is even more difficult now.  Patient states that she would like to do expectant management and declines vaginal progesterone.  Follow up mfm anatomy u/s, cervical length.   5. Language barrier In person interpreter used  6. Obesity affecting pregnancy in first trimester, unspecified obesity type Recommend patient watch weight as could be causing her pelvic discomfort, along with multip status  Early 1h and baseline pre-e labs negative  7. BMI 40.0-44.9, adult (HCC)  Preterm labor symptoms and general obstetric precautions including but not limited to vaginal bleeding, contractions, leaking of fluid and fetal movement were reviewed in detail with the patient. Please refer to After Visit Summary for other counseling recommendations.   Return in about 1 month (around 01/30/2024) for in person, md or app, low risk ob.  Future Appointments  Date Time Provider Department Center  01/04/2024  7:00 AM [facility] PROVIDER  1 [facility]  01/04/2024  7:30 AM [facility] US4 [facility]  01/04/2024  9:00 AM [facility] GENETIC COUNSELING RM [facility]  01/27/2024  9:55 AM Izell Harari, MD [facility]    Harari Izell, MD

## 2024-01-01 LAB — AFP, SERUM, OPEN SPINA BIFIDA
AFP MoM: 1.32
AFP Value: 53.7 ng/mL
Gest. Age on Collection Date: 19.1 wk
Maternal Age At EDD: 38.5 a
OSBR Risk 1 IN: 4529
Test Results:: NEGATIVE
Weight: 212 [lb_av]

## 2024-01-04 ENCOUNTER — Ambulatory Visit: Payer: Self-pay | Attending: Obstetrics and Gynecology | Admitting: Obstetrics

## 2024-01-04 ENCOUNTER — Other Ambulatory Visit (HOSPITAL_BASED_OUTPATIENT_CLINIC_OR_DEPARTMENT_OTHER): Payer: Self-pay

## 2024-01-04 ENCOUNTER — Other Ambulatory Visit: Payer: Self-pay | Admitting: *Deleted

## 2024-01-04 ENCOUNTER — Ambulatory Visit: Payer: Self-pay

## 2024-01-04 VITALS — BP 124/65

## 2024-01-04 DIAGNOSIS — E669 Obesity, unspecified: Secondary | ICD-10-CM

## 2024-01-04 DIAGNOSIS — Z36 Encounter for antenatal screening for chromosomal anomalies: Secondary | ICD-10-CM

## 2024-01-04 DIAGNOSIS — O09522 Supervision of elderly multigravida, second trimester: Secondary | ICD-10-CM | POA: Insufficient documentation

## 2024-01-04 DIAGNOSIS — Z363 Encounter for antenatal screening for malformations: Secondary | ICD-10-CM | POA: Insufficient documentation

## 2024-01-04 DIAGNOSIS — O0942 Supervision of pregnancy with grand multiparity, second trimester: Secondary | ICD-10-CM

## 2024-01-04 DIAGNOSIS — O99212 Obesity complicating pregnancy, second trimester: Secondary | ICD-10-CM

## 2024-01-04 DIAGNOSIS — O99012 Anemia complicating pregnancy, second trimester: Secondary | ICD-10-CM | POA: Insufficient documentation

## 2024-01-04 DIAGNOSIS — Z3A19 19 weeks gestation of pregnancy: Secondary | ICD-10-CM | POA: Insufficient documentation

## 2024-01-04 DIAGNOSIS — O09292 Supervision of pregnancy with other poor reproductive or obstetric history, second trimester: Secondary | ICD-10-CM

## 2024-01-04 DIAGNOSIS — Z641 Problems related to multiparity: Secondary | ICD-10-CM

## 2024-01-04 DIAGNOSIS — O09212 Supervision of pregnancy with history of pre-term labor, second trimester: Secondary | ICD-10-CM | POA: Insufficient documentation

## 2024-01-04 DIAGNOSIS — D649 Anemia, unspecified: Secondary | ICD-10-CM | POA: Insufficient documentation

## 2024-01-04 DIAGNOSIS — O09899 Supervision of other high risk pregnancies, unspecified trimester: Secondary | ICD-10-CM

## 2024-01-04 NOTE — Progress Notes (Signed)
 MFM Consult Note  Shannon Myers is currently at 19 weeks and 6 days.  She was seen due to advanced maternal age (38 years old), grand multiparity, and maternal obesity with a BMI of 46.4.  She denies any significant past medical history and denies any problems in her current pregnancy.    She had a cell free DNA test earlier in her pregnancy which indicated a low risk for trisomy 67, 73, and 13. A female fetus is predicted.  She was informed that the fetal growth and amniotic fluid level were appropriate for her gestational age.   There were no obvious fetal anomalies noted on today's ultrasound exam.  However, today's exam was limited due to maternal body habitus.  The patient was informed that anomalies may be missed due to technical limitations. If the fetus is in a suboptimal position or maternal habitus is increased, visualization of the fetus in the maternal uterus may be impaired.  The increased risk of fetal aneuploidy due to advanced maternal age was discussed.   Due to advanced maternal age, the patient was offered and declined an amniocentesis today for definitive diagnosis of fetal aneuploidy.  She was comfortable with the low risk indicated by her negative cell free DNA test.  The patient declined to meet with our genetic counselor today.  Due to maternal obesity, grand multiparity, and advanced maternal age, we will continue to follow her with growth ultrasounds throughout her pregnancy.    Weekly fetal testing should be started at around 34 weeks.    A follow-up growth scan was scheduled in 4 weeks.    The patient stated that all of her questions were answered today.    All conversations were held with the patient today with the help of a Spanish interpreter.  A total of 30 minutes was spent counseling and coordinating the care for this patient.  Greater than 50% of the time was spent in direct face-to-face contact.

## 2024-01-27 ENCOUNTER — Ambulatory Visit (INDEPENDENT_AMBULATORY_CARE_PROVIDER_SITE_OTHER): Payer: Self-pay | Admitting: Obstetrics and Gynecology

## 2024-01-27 VITALS — BP 115/70 | HR 85 | Wt 211.4 lb

## 2024-01-27 DIAGNOSIS — O0990 Supervision of high risk pregnancy, unspecified, unspecified trimester: Secondary | ICD-10-CM

## 2024-01-27 DIAGNOSIS — O0992 Supervision of high risk pregnancy, unspecified, second trimester: Secondary | ICD-10-CM

## 2024-01-27 DIAGNOSIS — Z3A23 23 weeks gestation of pregnancy: Secondary | ICD-10-CM

## 2024-01-27 MED ORDER — ASPIRIN 81 MG PO TBEC: 81.0000 mg | DELAYED_RELEASE_TABLET | Freq: Every day | ORAL | 1 refills | Status: DC

## 2024-01-27 NOTE — Progress Notes (Signed)
   PRENATAL VISIT NOTE  Subjective:  Shannon Myers is a 38 y.o. [name] at [redacted]w[redacted]d being seen today for ongoing prenatal care.  She is currently monitored for the following issues for this high-risk pregnancy and has Obesity affecting pregnancy in first trimester; History of preterm delivery, currently pregnant; Language barrier; Grand multiparity with current pregnancy in second trimester; AMA (advanced maternal age) multigravida 35+, second trimester; Supervision of high risk pregnancy, antepartum; and BMI 40.0-44.9, adult (HCC) on their problem list.  Patient reports no issues.  Contractions: Not present. Vag. Bleeding: None.  Movement: Present. Denies leaking of fluid.   The following portions of the patient's history were reviewed and updated as appropriate: allergies, current medications, past family history, past medical history, past social history, past surgical history and problem list.   Objective:    Vitals:   01/27/24 0950  BP: 115/70  Pulse: 85  Weight: 211 lb 6.4 oz (95.9 kg)    Fetal Status:  Fetal Heart Rate (bpm): 150   Movement: Present    General: Alert, oriented and cooperative. Patient is in no acute distress.  Skin: Skin is warm and dry. No rash noted.   Cardiovascular: Normal heart rate noted  Respiratory: Normal respiratory effort, no problems with respiration noted  Abdomen: Soft, gravid, appropriate for gestational age.  Pain/Pressure: Present (pressure)     Pelvic: Cervical exam performed in the presence of a chaperone       Speculum exam negative  Extremities: Normal range of motion.  Edema: None  Mental Status: Normal mood and affect. Normal behavior. Normal judgment and thought content.   Assessment and Plan:  Pregnancy: [name] at [redacted]w[redacted]d 1. Supervision of high risk pregnancy, antepartum (Primary) Routine care. Pt states she is not on low dose ASA and has never been on it. D/w her re: rationale and pt amenable to starting it. Rx sent in 28wk labs  next visit  2. AMA (advanced maternal age) multigravida 35+, second trimester Low risk panorama.  Follow up 9/5 follow up growth; 93% normal AFI at anatomy u/s  3. Grand multiparity with current pregnancy in second trimester Consider Lysteda  at delivery  4. History of preterm delivery, currently pregnant Currently doing expectant manatment  5. Language barrier In person interpreter used  6. Obesity affecting pregnancy in first trimester, unspecified obesity type Weight stable  7. BMI 40.0-44.9, adult (HCC)  Preterm labor symptoms and general obstetric precautions including but not limited to vaginal bleeding, contractions, leaking of fluid and fetal movement were reviewed in detail with the patient. Please refer to After Visit Summary for other counseling recommendations.   No follow-ups on file.  Future Appointments  Date Time Provider Department Center  02/04/2024 11:15 AM [facility] PROVIDER 1 [facility]  02/04/2024 11:30 AM [facility] US5 [facility]  02/24/2024  9:00 AM [facility]A LAB [facility]  02/24/2024 10:55 AM Zina Jerilynn LABOR, MD [facility]  03/23/2024  1:15 PM Lola Donnice HERO, MD [facility]  04/06/2024  2:35 PM [facility]-GENERAL 1 [facility]  04/20/2024  1:15 PM [facility]-GENERAL 1 [facility]  04/28/2024 11:15 AM [facility]-GENERAL 1 [facility]  05/04/2024  1:15 PM W[facility]-GENERAL 1 [facility]  05/11/2024  1:15 PM W[facility]-GENERAL 1 [facility]  05/18/2024 10:55 AM [facility]-GENERAL 1 [facility]  05/26/2024  8:55 AM W[facility]-GENERAL 1 [facility]    Bebe Furry, MD

## 2024-02-04 ENCOUNTER — Ambulatory Visit: Payer: Self-pay

## 2024-02-04 ENCOUNTER — Telehealth: Payer: Self-pay

## 2024-02-04 NOTE — Telephone Encounter (Signed)
 R/s appt due to transportation issues

## 2024-02-11 ENCOUNTER — Ambulatory Visit: Payer: Self-pay | Attending: Obstetrics | Admitting: Obstetrics and Gynecology

## 2024-02-11 ENCOUNTER — Other Ambulatory Visit: Payer: Self-pay | Admitting: *Deleted

## 2024-02-11 ENCOUNTER — Ambulatory Visit (HOSPITAL_BASED_OUTPATIENT_CLINIC_OR_DEPARTMENT_OTHER): Payer: Self-pay

## 2024-02-11 VITALS — BP 114/61 | HR 78

## 2024-02-11 DIAGNOSIS — E66813 Obesity, class 3: Secondary | ICD-10-CM

## 2024-02-11 DIAGNOSIS — Z3A25 25 weeks gestation of pregnancy: Secondary | ICD-10-CM | POA: Insufficient documentation

## 2024-02-11 DIAGNOSIS — O99012 Anemia complicating pregnancy, second trimester: Secondary | ICD-10-CM | POA: Insufficient documentation

## 2024-02-11 DIAGNOSIS — Z362 Encounter for other antenatal screening follow-up: Secondary | ICD-10-CM | POA: Insufficient documentation

## 2024-02-11 DIAGNOSIS — O09899 Supervision of other high risk pregnancies, unspecified trimester: Secondary | ICD-10-CM

## 2024-02-11 DIAGNOSIS — O99212 Obesity complicating pregnancy, second trimester: Secondary | ICD-10-CM | POA: Insufficient documentation

## 2024-02-11 DIAGNOSIS — E669 Obesity, unspecified: Secondary | ICD-10-CM

## 2024-02-11 DIAGNOSIS — D649 Anemia, unspecified: Secondary | ICD-10-CM

## 2024-02-11 DIAGNOSIS — O0942 Supervision of pregnancy with grand multiparity, second trimester: Secondary | ICD-10-CM

## 2024-02-11 DIAGNOSIS — O09522 Supervision of elderly multigravida, second trimester: Secondary | ICD-10-CM

## 2024-02-11 DIAGNOSIS — O99211 Obesity complicating pregnancy, first trimester: Secondary | ICD-10-CM

## 2024-02-11 DIAGNOSIS — O0990 Supervision of high risk pregnancy, unspecified, unspecified trimester: Secondary | ICD-10-CM

## 2024-02-11 DIAGNOSIS — O09212 Supervision of pregnancy with history of pre-term labor, second trimester: Secondary | ICD-10-CM | POA: Insufficient documentation

## 2024-02-11 DIAGNOSIS — O09292 Supervision of pregnancy with other poor reproductive or obstetric history, second trimester: Secondary | ICD-10-CM

## 2024-02-11 NOTE — Progress Notes (Signed)
 Maternal-Fetal Medicine Consultation  Name: Shannon Myers  MRN: [id number]  GA: [name] [redacted]w[redacted]d   Patient is here for completion of fetal anatomy. -Grand multiparity obstetrical history is significant for 8 vaginal deliveries. Pregravid BMI 46.  Early screening ruled out gestational diabetes. Advanced maternal age.  On cell-free fetal DNA screening, the risks of fetal aneuploidies are not increased.  Ultrasound Amniotic fluid is normal good fetal activity seen.  The estimated fetal weight is at the 96th percentile and the abdominal circumference measurement at the 97th percentile.  Fetal anatomical survey was completed and appears normal.  I counseled the patient with the help of Spanish language interpreter  Pregravid BMI  Grade 3 obesity is independently associated with increased risk of stillbirth (2.5- to 3-fold), but the absolute risk is very small.  I discussed protocol of weekly antenatal testing from [redacted] weeks gestation until delivery. I explained the components of antenatal testing (biophysical profile) and that if it reassuring, the risk of stillbirth is less than 1 per 1,000 births in one week. Obesity is associated with increased risk for gestational diabetes and gestational hypertension. Ultrasound has limitations in the resolution of ultrasound images and fetal anomalies may be missed.   Grand multiparity Patient did not give history of postpartum hemorrhage.  Grand multiparity is associated with a slight increase in postpartum hemorrhage.  Patient expressed desire to have bilateral tubal sterilization.  I counseled the patient that postpartum sterilization can be performed immediately after delivery.   Recommendations - An appointment was made for her to return in 5 weeks for fetal growth assessment. - Weekly antenatal testing from [redacted] weeks gestation until delivery.     Consultation including face-to-face (more than 50%) counseling 20 minutes.

## 2024-02-24 ENCOUNTER — Other Ambulatory Visit: Payer: Self-pay

## 2024-02-24 ENCOUNTER — Ambulatory Visit (INDEPENDENT_AMBULATORY_CARE_PROVIDER_SITE_OTHER): Payer: Self-pay | Admitting: Obstetrics and Gynecology

## 2024-02-24 ENCOUNTER — Encounter: Payer: Self-pay | Admitting: Obstetrics and Gynecology

## 2024-02-24 VITALS — BP 102/64 | HR 86 | Wt 213.4 lb

## 2024-02-24 DIAGNOSIS — O0990 Supervision of high risk pregnancy, unspecified, unspecified trimester: Secondary | ICD-10-CM

## 2024-02-24 DIAGNOSIS — Z603 Acculturation difficulty: Secondary | ICD-10-CM

## 2024-02-24 DIAGNOSIS — O99211 Obesity complicating pregnancy, first trimester: Secondary | ICD-10-CM

## 2024-02-24 DIAGNOSIS — Z758 Other problems related to medical facilities and other health care: Secondary | ICD-10-CM

## 2024-02-24 DIAGNOSIS — Z3A27 27 weeks gestation of pregnancy: Secondary | ICD-10-CM

## 2024-02-24 DIAGNOSIS — O99212 Obesity complicating pregnancy, second trimester: Secondary | ICD-10-CM

## 2024-02-24 DIAGNOSIS — O09899 Supervision of other high risk pregnancies, unspecified trimester: Secondary | ICD-10-CM

## 2024-02-24 DIAGNOSIS — Z6841 Body Mass Index (BMI) 40.0 and over, adult: Secondary | ICD-10-CM

## 2024-02-24 DIAGNOSIS — O0992 Supervision of high risk pregnancy, unspecified, second trimester: Secondary | ICD-10-CM

## 2024-02-24 DIAGNOSIS — O09892 Supervision of other high risk pregnancies, second trimester: Secondary | ICD-10-CM

## 2024-02-24 DIAGNOSIS — O09522 Supervision of elderly multigravida, second trimester: Secondary | ICD-10-CM

## 2024-02-24 NOTE — Progress Notes (Signed)
   PRENATAL VISIT NOTE  Subjective:  Shannon Myers is a 38 y.o. [name] at [redacted]w[redacted]d being seen today for ongoing prenatal care.  She is currently monitored for the following issues for this high-risk pregnancy and has Obesity affecting pregnancy in first trimester; History of preterm delivery, currently pregnant; Language barrier; Grand multiparity with current pregnancy in second trimester; AMA (advanced maternal age) multigravida 38+, second trimester; Supervision of high risk pregnancy, antepartum; and BMI 40.0-44.9, adult (HCC) on their problem list.  Patient doing well with no acute concerns today. She reports no complaints.  Contractions: Not present. Vag. Bleeding: None.  Movement: Present. Denies leaking of fluid.   The following portions of the patient's history were reviewed and updated as appropriate: allergies, current medications, past family history, past medical history, past social history, past surgical history and problem list. Problem list updated.  Objective:   Vitals:   02/24/24 1143  BP: 102/64  Pulse: 86  Weight: 213 lb 6.4 oz (96.8 kg)    Fetal Status: Fetal Heart Rate (bpm): 143 Fundal Height: 28 cm Movement: Present     General:  Alert, oriented and cooperative. Patient is in no acute distress.  Skin: Skin is warm and dry. No rash noted.   Cardiovascular: Normal heart rate noted  Respiratory: Normal respiratory effort, no problems with respiration noted  Abdomen: Soft, gravid, appropriate for gestational age.  Pain/Pressure: Present     Pelvic: Cervical exam deferred        Extremities: Normal range of motion.  Edema: Trace (hands)  Mental Status:  Normal mood and affect. Normal behavior. Normal judgment and thought content.   Assessment and Plan:  Pregnancy: [name] at [redacted]w[redacted]d  1. [redacted] weeks gestation of pregnancy (Primary)   2. Supervision of high risk pregnancy, antepartum Continue routine prenatal care  3. History of preterm delivery, currently  pregnant No s/sx of preterm labor  4. Language barrier Tele interpreter utilized  5. Obesity affecting pregnancy in first trimester, unspecified obesity type   6. BMI 40.0-44.9, adult (HCC)   7. AMA (advanced maternal age) multigravida 38+, second trimester US  follow up on 03/21/24   Preterm labor symptoms and general obstetric precautions including but not limited to vaginal bleeding, contractions, leaking of fluid and fetal movement were reviewed in detail with the patient.  Please refer to After Visit Summary for other counseling recommendations.   Return in about 2 weeks (around 03/09/2024) for [facility], in person.   Jerilynn Buddle, MD Faculty Attending Center for [facility]

## 2024-02-25 LAB — CBC
Hematocrit: 36.1 % (ref 34.0–46.6)
Hemoglobin: 11.9 g/dL (ref 11.1–15.9)
MCH: 29.5 pg (ref 26.6–33.0)
MCHC: 33 g/dL (ref 31.5–35.7)
MCV: 89 fL (ref 79–97)
Platelets: 232 x10E3/uL (ref 150–450)
RBC: 4.04 x10E6/uL (ref 3.77–5.28)
RDW: 12.9 % (ref 11.7–15.4)
WBC: 8.5 x10E3/uL (ref 3.4–10.8)

## 2024-02-25 LAB — GLUCOSE TOLERANCE, 2 HOURS W/ 1HR
Glucose, 1 hour: 150 mg/dL (ref 70–179)
Glucose, 2 hour: 116 mg/dL (ref 70–152)
Glucose, Fasting: 80 mg/dL (ref 70–91)

## 2024-02-25 LAB — HIV ANTIBODY (ROUTINE TESTING W REFLEX): HIV Screen 4th Generation wRfx: NONREACTIVE

## 2024-02-25 LAB — RPR: RPR Ser Ql: NONREACTIVE

## 2024-02-29 ENCOUNTER — Ambulatory Visit: Payer: Self-pay | Admitting: Obstetrics and Gynecology

## 2024-02-29 DIAGNOSIS — O0990 Supervision of high risk pregnancy, unspecified, unspecified trimester: Secondary | ICD-10-CM

## 2024-03-08 ENCOUNTER — Telehealth: Payer: Self-pay | Admitting: *Deleted

## 2024-03-08 NOTE — Telephone Encounter (Signed)
 Requested Interpreter Eda Royal call patient to offer CenteringPrenatal prenatal care. She was unable to reach her. Rock Skip PEAK

## 2024-03-17 ENCOUNTER — Ambulatory Visit: Payer: Self-pay

## 2024-03-21 ENCOUNTER — Ambulatory Visit: Payer: Self-pay | Attending: Obstetrics and Gynecology | Admitting: Maternal & Fetal Medicine

## 2024-03-21 ENCOUNTER — Ambulatory Visit: Payer: Self-pay

## 2024-03-21 ENCOUNTER — Other Ambulatory Visit: Payer: Self-pay | Admitting: *Deleted

## 2024-03-21 VITALS — BP 112/63

## 2024-03-21 DIAGNOSIS — O09522 Supervision of elderly multigravida, second trimester: Secondary | ICD-10-CM

## 2024-03-21 DIAGNOSIS — O09523 Supervision of elderly multigravida, third trimester: Secondary | ICD-10-CM

## 2024-03-21 DIAGNOSIS — O0990 Supervision of high risk pregnancy, unspecified, unspecified trimester: Secondary | ICD-10-CM

## 2024-03-21 DIAGNOSIS — E669 Obesity, unspecified: Secondary | ICD-10-CM

## 2024-03-21 DIAGNOSIS — Z3A30 30 weeks gestation of pregnancy: Secondary | ICD-10-CM | POA: Insufficient documentation

## 2024-03-21 DIAGNOSIS — O99211 Obesity complicating pregnancy, first trimester: Secondary | ICD-10-CM

## 2024-03-21 DIAGNOSIS — O0943 Supervision of pregnancy with grand multiparity, third trimester: Secondary | ICD-10-CM

## 2024-03-21 DIAGNOSIS — O99213 Obesity complicating pregnancy, third trimester: Secondary | ICD-10-CM

## 2024-03-21 DIAGNOSIS — Z3689 Encounter for other specified antenatal screening: Secondary | ICD-10-CM | POA: Insufficient documentation

## 2024-03-21 DIAGNOSIS — O09213 Supervision of pregnancy with history of pre-term labor, third trimester: Secondary | ICD-10-CM

## 2024-03-21 DIAGNOSIS — O09899 Supervision of other high risk pregnancies, unspecified trimester: Secondary | ICD-10-CM

## 2024-03-21 DIAGNOSIS — Z8751 Personal history of pre-term labor: Secondary | ICD-10-CM | POA: Insufficient documentation

## 2024-03-21 NOTE — Progress Notes (Signed)
 Patient information  Patient Name: Shannon Myers  Patient MRN:   [id number]  Referring practice: MFM Referring Provider: [facility])  Problem List   Patient Active Problem List   Diagnosis Date Noted   BMI 40.0-44.9, adult (HCC) 12/31/2023   AMA (advanced maternal age) multigravida 35+, third trimester 12/30/2023   Supervision of high risk pregnancy, antepartum 12/30/2023   Grand multiparity with current pregnancy in third trimester 12/22/2023   History of preterm delivery, currently pregnant 10/08/2020   Language barrier 10/08/2020   Obesity affecting pregnancy in third trimester    Maternal Fetal medicine Consult  Shannon Myers is a 38 y.o. [name] at [redacted]w[redacted]d here for ultrasound and consultation. Shannon Myers is doing well today with no acute concerns. Today we focused on the following:   Elevated BMI: The patient is here for growth ultrasound.  This shows that the fetal weight is in the 99th percentile.  She passed her GTT.  I discussed the importance of monitoring and reducing her carbohydrate and sugar intake to avoid fetal macrosomia.  We discussed the importance of this clinically.  Due to the elevated BMI we will also perform antenatal testing and serial growth ultrasounds throughout the rest of the pregnancy.  Delivery timing will be based on the EFW later at the 6-week ultrasound.  The patient had time to ask questions that were answered to her satisfaction.  She verbalized understanding and agrees to proceed with the plan below.  Sonographic findings Single intrauterine pregnancy at 30w 6d.  Fetal cardiac activity:  Observed and appears normal. Presentation: Cephalic. Interval fetal anatomy appears normal. Fetal biometry shows the estimated fetal weight at the > 99 percentile. Amniotic fluid volume: Within normal limits. MVP: 7.24 cm. Placenta: Anterior.  There are limitations of prenatal ultrasound such as the inability to  detect certain abnormalities due to poor visualization. Various factors such as fetal position, gestational age and maternal body habitus may increase the difficulty in visualizing the fetal anatomy.    Recommendations - Growth and BPP in weeks - NST weekly at 34 weeks (except for week 6 since she has a BPP that week) - Delivery timing based on future ultrasounds and antenatal testing  An in person interpreter was used in the patient's language of preference for today's visit.  Review of Systems: A review of systems was performed and was negative except per HPI   Vitals and Physical Exam    03/21/2024    7:44 AM 02/24/2024   11:43 AM 02/11/2024    9:02 AM  Vitals with BMI  Weight  213 lbs 6 oz   Systolic 112 102 885  Diastolic 63 64 61  Pulse  86 78    Sitting comfortably on the sonogram table Nonlabored breathing Normal rate and rhythm Abdomen is nontender  Past pregnancies OB History  Gravida Para Term Preterm AB Living  10 8 7 1 1 8   SAB IAB Ectopic Multiple Live Births  1 0 0 1 8    # Outcome Date GA Lbr Len/2nd Weight Sex Type Anes PTL Lv  10A Gravida           10B Current           9 Term 05/06/21 [redacted]w[redacted]d 06:16 7 lb 4.1 oz (3.29 kg) F Vag-Spont EPI  LIV     Birth Comments: WNL  8 SAB 07/2020          7 Term 01/09/17 [redacted]w[redacted]d  09:08 5 lb 14.2 oz (2.67 kg) F Vag-Spont None N LIV  6 Term 11/12/14 [redacted]w[redacted]d  6 lb 7.7 oz (2.94 kg) M Vag-Spont None  LIV     Birth Comments: none- precepitous delivery- delivered in hallway  5 Term 02/02/13 [redacted]w[redacted]d 07:32 / 00:26 6 lb 1.7 oz (2.77 kg) F Vag-Spont None  LIV     Birth Comments: anemia  4 Term 03/11/12 [redacted]w[redacted]d   M Vag-Spont EPI  LIV  3 Term 07/2006 [redacted]w[redacted]d   M Vag-Spont EPI  LIV     Birth Comments: got weekly shots  to prevent early delivery  2 Preterm 09/2003 [redacted]w[redacted]d   M Vag-Spont EPI Y LIV     Birth Comments: Shannon Myers was in NICU  1 Term 05/2001 [redacted]w[redacted]d  5 lb (2.268 kg)     LIV     Birth Comments: no complications, born in [place]     I  spent 20 minutes reviewing the patients chart, including labs and images as well as counseling the patient about her medical conditions. Greater than 50% of the time was spent in direct face-to-face patient counseling.  Delora Smaller  MFM, [facility]   03/21/2024  8:38 AM

## 2024-03-23 ENCOUNTER — Encounter: Payer: Self-pay | Admitting: Family Medicine

## 2024-03-23 ENCOUNTER — Other Ambulatory Visit: Payer: Self-pay

## 2024-03-23 ENCOUNTER — Ambulatory Visit: Payer: Self-pay | Admitting: Family Medicine

## 2024-03-23 VITALS — BP 105/72 | HR 89 | Wt 215.7 lb

## 2024-03-23 DIAGNOSIS — O09893 Supervision of other high risk pregnancies, third trimester: Secondary | ICD-10-CM

## 2024-03-23 DIAGNOSIS — Z3A31 31 weeks gestation of pregnancy: Secondary | ICD-10-CM

## 2024-03-23 DIAGNOSIS — Z758 Other problems related to medical facilities and other health care: Secondary | ICD-10-CM

## 2024-03-23 DIAGNOSIS — O0943 Supervision of pregnancy with grand multiparity, third trimester: Secondary | ICD-10-CM

## 2024-03-23 DIAGNOSIS — O0990 Supervision of high risk pregnancy, unspecified, unspecified trimester: Secondary | ICD-10-CM

## 2024-03-23 DIAGNOSIS — O09899 Supervision of other high risk pregnancies, unspecified trimester: Secondary | ICD-10-CM

## 2024-03-23 DIAGNOSIS — O09523 Supervision of elderly multigravida, third trimester: Secondary | ICD-10-CM

## 2024-03-23 DIAGNOSIS — Z603 Acculturation difficulty: Secondary | ICD-10-CM

## 2024-03-23 DIAGNOSIS — O0993 Supervision of high risk pregnancy, unspecified, third trimester: Secondary | ICD-10-CM

## 2024-03-23 DIAGNOSIS — O99213 Obesity complicating pregnancy, third trimester: Secondary | ICD-10-CM

## 2024-03-23 NOTE — Patient Instructions (Signed)
 Opciones de mtodos anticonceptivos Birth Control Options Los mtodos anticonceptivos tambin se denominan anticonceptivos. Los anticonceptivos previenen Firefighter. Hay muchos tipos de anticonceptivos. Trabaje con el mdico para encontrar la opcin ms adecuada para usted. Anticonceptivos que [place] hormonas Estos tipos de anticonceptivos contienen hormonas para Neurosurgeon. Implante anticonceptivo Este es un pequeo tubo que se coloca dentro de la piel del brazo. El tubo Insurance claims handler colocado durante 3 aos como mximo. Inyeccin anticonceptiva Son inyecciones que se aplican cada 3 meses. Pldoras anticonceptivas Esta es una pldora que se toma todos [place]. Debe tomarla a la [employer]. Parche anticonceptivo Este es un parche que se coloca sobre la piel. Se debe cambiar 1 vez por semana durante 3 semanas. Despus de [employer], el parche se debe retirar durante 1 semana. Anillo vaginal  Este es un anillo de plstico blando que se coloca dentro de la vagina. El anillo se deja colocado durante 3 semanas. Luego, se debe retirar durante 1 semana. Despus se coloca un nuevo anillo. Mtodos de barrera  Preservativo masculino Es una cubierta delgada que se coloca sobre el pene antes de [place]. El preservativo se desecha despus de Doctor, hospital. Preservativo femenino Es una cubierta blanda y suelta que se coloca en la vagina antes de [place]. El preservativo se desecha despus de Doctor, hospital. Diafragma El diafragma es una barrera blanda con forma de tazn. Debe estar hecho para adaptarse a su cuerpo. Se coloca en la vagina antes de tener sexo con una sustancia qumica que destruye los espermatozoides llamada espermicida. El Designer, fashion/clothing en la vagina durante 6 a 8 horas despus de tener sexo y debe retirarse en un plazo de 24 horas. El diafragma se debe reemplazar: Cada 1 o 2 aos. Despus de dar a luz. Despus de aumentar ms de 15 libras (6.8  kg). Si se somete a una ciruga en la pelvis. Capuchn cervical Este es un capuchn pequeo y [place] se fija sobre el cuello uterino. El cuello uterino es la parte ms baja del tero. Se coloca en la vagina antes del sexo, junto con un espermicida. El capuchn debe fabricarse para usted. El capuchn se debe dejar colocado durante 6 a 8 horas despus del sexo. Se debe retirar en un plazo de 48 horas. El capuchn cervical debe ser recetado y adaptado a su cuerpo por un mdico. Debe reemplazarse cada 2 aos. Esponja Esta es una esponja pequea que se coloca en la vagina antes de [place]. Se debe dejar colocada durante al menos 6 horas despus de [employer]. Se debe retirar en un plazo de 30 horas y desecharse. Espermicidas Son sustancias qumicas que destruyen o impiden que los espermatozoides ingresen al tero. Se pueden presentar en forma de pldora, crema, gel o espuma que se debe colocar en la vagina. Se deben usar al menos de 10 a 15 minutos antes de [employer]. Dispositivo intrauterino Un dispositivo intrauterino (DIU) es un dispositivo que un mdico coloca dentro del tero. Existen dos tipos: DIU hormonal. Este tipo puede permanecer colocado durante 3 a 5 aos. DIU de cobre. Este tipo Insurance claims handler colocado durante 10 aos. Mtodos anticonceptivos permanentes Ligadura de trompas en la mujer Es una ciruga para obstruir las trompas de [place]. Esterilizacin masculina Es una ciruga, llamada vasectoma, para ligar los conductos que transportan los espermatozoides en los hombres. Este mtodo funciona al cabo de 3 meses. Se deben usar otros mtodos anticonceptivos durante 3 meses. Mtodos de planificacin  natural Esto significa no tener [employer] la pareja femenina podra quedar embarazada. A continuacin se mencionan algunos mtodos anticonceptivos por planificacin natural: Usar un calendario a fin de: Hacer un seguimiento de la duracin de cada ciclo  menstrual. Determinar en [employer] se podra producir Firefighter. Planificar no tener [employer] en que se podra producir Firefighter. Reconocer los signos de la ovulacin y no tener relaciones sexuales durante ese perodo. La pareja femenina puede detectar cundo ser la ovulacin haciendo un seguimiento de su temperatura todos [place]. Tambin puede examinar si hay cambios en la mucosidad que proviene del cuello uterino. Dnde obtener ms informacin Centers for Disease Control and Prevention (Centros para el Control y la Prevencin de Enfermedades): [email or web address]. Luego: Introduzca "birth control" o "anticonceptivos" en el cuadro de bsqueda. Esta informacin no tiene Theme park manager el consejo del mdico. Asegrese de hacerle al mdico cualquier pregunta que tenga. Document Revised: 01/22/2023 Document Reviewed: 01/22/2023 Elsevier Patient Education  2024 [employer].

## 2024-03-23 NOTE — Progress Notes (Signed)
   Subjective:  Shannon Myers is a 38 y.o. [name] at [redacted]w[redacted]d being seen today for ongoing prenatal care.  She is currently monitored for the following issues for this high-risk pregnancy and has Obesity affecting pregnancy in third trimester; History of preterm delivery, currently pregnant; Language barrier; Grand multiparity with current pregnancy in third trimester; AMA (advanced maternal age) multigravida 35+, third trimester; Supervision of high risk pregnancy, antepartum; and BMI 40.0-44.9, adult (HCC) on their problem list.  Patient reports no complaints.  Contractions: Irritability. Vag. Bleeding: None.  Movement: Present. Denies leaking of fluid.   The following portions of the patient's history were reviewed and updated as appropriate: allergies, current medications, past family history, past medical history, past social history, past surgical history and problem list. Problem list updated.  Objective:   Vitals:   03/23/24 1332  BP: 105/72  Pulse: 89  Weight: 215 lb 11.2 oz (97.8 kg)    Fetal Status: Fetal Heart Rate (bpm): 140   Movement: Present     General:  Alert, oriented and cooperative. Patient is in no acute distress.  Skin: Skin is warm and dry. No rash noted.   Cardiovascular: Normal heart rate noted  Respiratory: Normal respiratory effort, no problems with respiration noted  Abdomen: Soft, gravid, appropriate for gestational age. Pain/Pressure: Present     Pelvic: Vag. Bleeding: None     Cervical exam deferred        Extremities: Normal range of motion.  Edema: Mild pitting, slight indentation  Mental Status: Normal mood and affect. Normal behavior. Normal judgment and thought content.   Urinalysis:      Assessment and Plan:  Pregnancy: [name] at [redacted]w[redacted]d  1. Supervision of high risk pregnancy, antepartum (Primary) BP and FHR normal Labs up to date Discussed flu/tdap/RSV vaccines, given letter to take to [facility] for these and advised she can't receive RSV  until 32 weeks Discussed contraception, reviewed out of pocket cost of $8-12k with interval/postpartum tubals, she says this is too much for her We discussed IUD, she is not interested Open to nexplanon  and vasectomy Will bring partner to next visit with me to discuss this further  2. Obesity affecting pregnancy in third trimester, unspecified obesity type Antenatal testing weekly starting at 34 weeks per MFM  3. Language barrier Spanish  4. History of preterm delivery, currently pregnant At [redacted] wks with second pregnancy, 6 term NSVD since then  5. Grand multiparity with current pregnancy in third trimester NSVD x8, PPH prophylaxis at time of delivery  6. AMA (advanced maternal age) multigravida 35+, third trimester   Preterm labor symptoms and general obstetric precautions including but not limited to vaginal bleeding, contractions, leaking of fluid and fetal movement were reviewed in detail with the patient. Please refer to After Visit Summary for other counseling recommendations.  Return in about 2 weeks (around 04/06/2024) for [facility], ob visit.   Lola Donnice HERO, MD

## 2024-04-06 ENCOUNTER — Other Ambulatory Visit: Payer: Self-pay

## 2024-04-06 ENCOUNTER — Encounter: Payer: Self-pay | Admitting: Obstetrics and Gynecology

## 2024-04-06 ENCOUNTER — Ambulatory Visit (INDEPENDENT_AMBULATORY_CARE_PROVIDER_SITE_OTHER): Payer: Self-pay | Admitting: Obstetrics and Gynecology

## 2024-04-06 VITALS — BP 113/77 | HR 105 | Wt 219.4 lb

## 2024-04-06 DIAGNOSIS — Z6841 Body Mass Index (BMI) 40.0 and over, adult: Secondary | ICD-10-CM

## 2024-04-06 DIAGNOSIS — O09899 Supervision of other high risk pregnancies, unspecified trimester: Secondary | ICD-10-CM

## 2024-04-06 DIAGNOSIS — Z758 Other problems related to medical facilities and other health care: Secondary | ICD-10-CM

## 2024-04-06 DIAGNOSIS — O09893 Supervision of other high risk pregnancies, third trimester: Secondary | ICD-10-CM

## 2024-04-06 DIAGNOSIS — O0943 Supervision of pregnancy with grand multiparity, third trimester: Secondary | ICD-10-CM

## 2024-04-06 DIAGNOSIS — O99213 Obesity complicating pregnancy, third trimester: Secondary | ICD-10-CM

## 2024-04-06 DIAGNOSIS — O3663X0 Maternal care for excessive fetal growth, third trimester, not applicable or unspecified: Secondary | ICD-10-CM

## 2024-04-06 DIAGNOSIS — Z3A33 33 weeks gestation of pregnancy: Secondary | ICD-10-CM

## 2024-04-06 DIAGNOSIS — Z603 Acculturation difficulty: Secondary | ICD-10-CM

## 2024-04-06 DIAGNOSIS — O09523 Supervision of elderly multigravida, third trimester: Secondary | ICD-10-CM

## 2024-04-06 DIAGNOSIS — O3660X0 Maternal care for excessive fetal growth, unspecified trimester, not applicable or unspecified: Secondary | ICD-10-CM | POA: Insufficient documentation

## 2024-04-06 NOTE — Progress Notes (Signed)
 PRENATAL VISIT NOTE  Subjective:  Shannon Myers is a 38 y.o. [name] at [redacted]w[redacted]d being seen today for ongoing prenatal care.  She is currently monitored for the following issues for this high-risk pregnancy and has Obesity affecting pregnancy in third trimester; History of preterm delivery, currently pregnant; Language barrier; Grand multiparity with current pregnancy in third trimester; AMA (advanced maternal age) multigravida 35+, third trimester; Supervision of high risk pregnancy, antepartum; BMI 40.0-44.9, adult (HCC); and Excessive fetal growth affecting management of mother in third trimester, antepartum on their problem list.  Patient reports low belly pressure.  Contractions: Irritability. Vag. Bleeding: None.  Movement: Present. Denies leaking of fluid.   The following portions of the patient's history were reviewed and updated as appropriate: allergies, current medications, past family history, past medical history, past social history, past surgical history and problem list.   Objective:   Vitals:   04/06/24 1434  BP: 113/77  Pulse: (!) 105  Weight: 219 lb 6.4 oz (99.5 kg)    Fetal Status:  Fetal Heart Rate (bpm): 143   Movement: Present    General: Alert, oriented and cooperative. Patient is in no acute distress.  Skin: Skin is warm and dry. No rash noted.   Cardiovascular: Normal heart rate noted  Respiratory: Normal respiratory effort, no problems with respiration noted  Abdomen: Soft, gravid, appropriate for gestational age.  Pain/Pressure: Present     Pelvic: Cervical exam performed in the presence of a chaperone Dilation:  (1cm externally but closed internally.) Effacement (%): Thick Station: Ballotable  Extremities: Normal range of motion.  Edema: Trace  Mental Status: Normal mood and affect. Normal behavior. Normal judgment and thought content.      04/30/2021    9:30 AM 04/21/2021    1:35 PM 03/31/2021   10:36 AM  Depression screen PHQ 2/9  Decreased  Interest 0 0 0  Down, Depressed, Hopeless 0 0 0  PHQ - 2 Score 0 0 0  Altered sleeping 0 0 0  Tired, decreased energy 0 0 1  Change in appetite 0 0 0  Feeling bad or failure about yourself  0 0 0  Trouble concentrating 0 0 0  Moving slowly or fidgety/restless 0 0 0  Suicidal thoughts 0 0 0  PHQ-9 Score 0  0  1      Data saved with a previous flowsheet row definition        04/30/2021    9:30 AM 04/21/2021    1:35 PM 03/31/2021   10:36 AM 03/17/2021    4:30 PM  GAD 7 : Generalized Anxiety Score  Nervous, Anxious, on Edge 0 0 1 0  Control/stop worrying 0 0 0 0  Worry too much - different things 0 0 0 0  Trouble relaxing 0 0 0 0  Restless 0 0 0 0  Easily annoyed or irritable 0 0 1 0  Afraid - awful might happen 0 0 0 0  Total GAD 7 Score 0 0 2 0    Assessment and Plan:  Pregnancy: [name] at [redacted]w[redacted]d 1. Excessive fetal growth affecting management of pregnancy in third trimester, single or unspecified fetus (Primary) S/p normal GTT 10/21: >99%, 2364g, ac >99%, afi 18  2. AMA (advanced maternal age) multigravida 35+, third trimester No issues Start qwk testing at 34wks  3. BMI 40.0-44.9, adult (HCC)  4. Grand multiparity with current pregnancy in third trimester  5. History of preterm delivery, currently pregnant  6. Language barrier In person interpreter used  7. Obesity affecting pregnancy in third trimester, unspecified obesity type  Preterm labor symptoms and general obstetric precautions including but not limited to vaginal bleeding, contractions, leaking of fluid and fetal movement were reviewed in detail with the patient. Please refer to After Visit Summary for other counseling recommendations.   No follow-ups on file.  Future Appointments  Date Time Provider Department Center  04/18/2024  1:15 PM [facility] NST [facility]  04/20/2024  1:15 PM Zina Jerilynn LABOR, MD [facility]  04/24/2024  1:15 PM Eveline Lynwood MATSU, MD [facility]  04/25/2024  1:15 PM [facility]  NST [facility]  05/02/2024 11:15 AM [facility] PROVIDER 1 [facility]  05/02/2024 11:30 AM [facility] US2 [facility]  05/04/2024  1:15 PM Lola Donnice HERO, MD [facility]  05/09/2024  1:15 PM [facility] NST [facility]  05/11/2024  1:15 PM Anyanwu, Gloris LABOR, MD [facility]  05/16/2024  1:15 PM [facility] NST [facility]  05/18/2024 10:35 AM Jerilynn Longs, NP [facility]  05/23/2024 10:15 AM Jerilynn Longs, NP [facility]    Bebe Furry, MD

## 2024-04-14 ENCOUNTER — Ambulatory Visit: Payer: Self-pay

## 2024-04-18 ENCOUNTER — Ambulatory Visit: Payer: Self-pay | Attending: Maternal & Fetal Medicine

## 2024-04-18 VITALS — BP 121/80 | HR 82

## 2024-04-18 DIAGNOSIS — Z3A34 34 weeks gestation of pregnancy: Secondary | ICD-10-CM

## 2024-04-18 DIAGNOSIS — E669 Obesity, unspecified: Secondary | ICD-10-CM

## 2024-04-18 DIAGNOSIS — O09523 Supervision of elderly multigravida, third trimester: Secondary | ICD-10-CM

## 2024-04-18 DIAGNOSIS — O99213 Obesity complicating pregnancy, third trimester: Secondary | ICD-10-CM

## 2024-04-18 NOTE — Progress Notes (Signed)
 Shannon Myers [birth year] [redacted]w[redacted]d  Fetus A Non-Stress Test Interpretation for 04/18/24  Indication: Advanced Maternal Age >40 years and LGA and Obesity  Fetal Heart Rate A Mode: External Baseline Rate (A): 135 bpm Variability: Moderate Accelerations: 15 x 15 Decelerations: None Multiple birth?: No  Uterine Activity Mode: Toco Contraction Frequency (min): None noted  Interpretation (Fetal Testing) Nonstress Test Interpretation: Reactive Overall Impression: Reassuring for gestational age Comments: Reviewed by Dr. Ileana

## 2024-04-18 NOTE — Patient Instructions (Signed)
 Prueba sin estrs: Hotel manager Test: What to Expect Durante el embarazo se realiza una prueba sin estrs, tambin conocida como NST, para controlar los latidos cardacos del beb. El procedimiento muestra si el beb est sano. Se puede realizar si: Ya pas la fecha de [place]. Tiene un embarazo de Conservator, museum/gallery. El beb se mueve menos de lo normal. Perdi un embarazo anterior. El beb crece de manera lenta. Hay demasiado o muy poco lquido alrededor del beb. La NST puede [employer] tercer trimestre para saber si es mejor que el beb nazca antes de [place]. Durante la prueba, se observan los latidos cardacos del beb durante al menos 20 minutos. Si el beb est sano, la frecuencia cardaca aumentar cuando se mueva y volver a la normalidad cuando est en reposo. Esto debera suceder al [employer] veces durante la prueba. Informe al [employer] sobre lo siguiente: Cualquier alergia que tenga. Cualquier problema mdico que tenga. Todos los [employer] toma. Estos incluyen vitaminas, hierbas medicinales, gotas oftlmicas y cremas. Cualquier ciruga a la que se haya sometido. Cualquier embarazo anterior que haya tenido. Cules son los riesgos? La prueba sin estrs no implica riesgos ni para usted ni para el beb. Este procedimiento no debera ser doloroso ni incmodo. Qu sucede antes? Coma justo antes de la prueba o segn lo indique el equipo de Psychologist, prison and probation services. La comida puede ayudar a que el beb se mueva. Vaya al bao justo antes de la prueba. Qu sucede durante una prueba sin estrs?  Le colocarn en el vientre dos monitores pequeos. Uno controlar la frecuencia cardaca del beb y el otro las contracciones. Le pedirn que se recueste sobre un costado o que se siente. Probablemente, le proporcionen un botn que deber presionar cuando sienta que el beb se mueve. Si el beb parece estar dormido, le pedirn que tome jugo o un refresco, coma un refrigerio o cambie de  posicin. Estos pasos pueden variar. Pregunte qu debe esperar. Qu puedo esperar despus? El equipo hablar con usted [employer] y Education administrator los siguientes pasos. Si el equipo le dio instrucciones sobre la alimentacin o la [place], asegrese de cumplirlas. Concurra a todas las visitas de seguimiento. Esto es importante para controlar su estado de salud y el del beb. Esta informacin no tiene Theme park manager el consejo del mdico. Asegrese de hacerle al mdico cualquier pregunta que tenga. Document Revised: 06/10/2023 Document Reviewed: 06/10/2023 Elsevier Patient Education  2025 [employer].

## 2024-04-20 ENCOUNTER — Ambulatory Visit: Payer: Self-pay | Admitting: Obstetrics and Gynecology

## 2024-04-20 ENCOUNTER — Other Ambulatory Visit: Payer: Self-pay

## 2024-04-20 VITALS — BP 120/79 | HR 91 | Wt 221.4 lb

## 2024-04-20 DIAGNOSIS — O0993 Supervision of high risk pregnancy, unspecified, third trimester: Secondary | ICD-10-CM

## 2024-04-20 DIAGNOSIS — Z6841 Body Mass Index (BMI) 40.0 and over, adult: Secondary | ICD-10-CM

## 2024-04-20 DIAGNOSIS — O3663X0 Maternal care for excessive fetal growth, third trimester, not applicable or unspecified: Secondary | ICD-10-CM

## 2024-04-20 DIAGNOSIS — O0990 Supervision of high risk pregnancy, unspecified, unspecified trimester: Secondary | ICD-10-CM

## 2024-04-20 DIAGNOSIS — O0943 Supervision of pregnancy with grand multiparity, third trimester: Secondary | ICD-10-CM

## 2024-04-20 DIAGNOSIS — Z3A35 35 weeks gestation of pregnancy: Secondary | ICD-10-CM

## 2024-04-20 DIAGNOSIS — O09899 Supervision of other high risk pregnancies, unspecified trimester: Secondary | ICD-10-CM

## 2024-04-20 DIAGNOSIS — Z758 Other problems related to medical facilities and other health care: Secondary | ICD-10-CM

## 2024-04-20 DIAGNOSIS — O99213 Obesity complicating pregnancy, third trimester: Secondary | ICD-10-CM

## 2024-04-20 DIAGNOSIS — Z23 Encounter for immunization: Secondary | ICD-10-CM

## 2024-04-20 DIAGNOSIS — O09523 Supervision of elderly multigravida, third trimester: Secondary | ICD-10-CM

## 2024-04-20 DIAGNOSIS — Z603 Acculturation difficulty: Secondary | ICD-10-CM

## 2024-04-20 NOTE — Progress Notes (Signed)
   PRENATAL VISIT NOTE  Subjective:  Shannon Myers is a 38 y.o. [name] at 108w1d being seen today for ongoing prenatal care.  She is currently monitored for the following issues for this high-risk pregnancy and has Obesity affecting pregnancy in third trimester; History of preterm delivery, currently pregnant; Language barrier; Grand multiparity with current pregnancy in third trimester; AMA (advanced maternal age) multigravida 35+, third trimester; Supervision of high risk pregnancy, antepartum; BMI 40.0-44.9, adult (HCC); and Excessive fetal growth affecting management of mother in third trimester, antepartum on their problem list.  Patient doing well with no acute concerns today. She reports no complaints.  Contractions: Irritability. Vag. Bleeding: None.  Movement: Present. Denies leaking of fluid.   The following portions of the patient's history were reviewed and updated as appropriate: allergies, current medications, past family history, past medical history, past social history, past surgical history and problem list. Problem list updated.  Objective:   Vitals:   04/20/24 1317  BP: 120/79  Pulse: 91  Weight: 221 lb 6.4 oz (100.4 kg)    Fetal Status: Fetal Heart Rate (bpm): 139 Fundal Height: 38 cm Movement: Present     General:  Alert, oriented and cooperative. Patient is in no acute distress.  Skin: Skin is warm and dry. No rash noted.   Cardiovascular: Normal heart rate noted  Respiratory: Normal respiratory effort, no problems with respiration noted  Abdomen: Soft, gravid, appropriate for gestational age.  Pain/Pressure: Present (Pressure)     Pelvic: Cervical exam deferred        Extremities: Normal range of motion.  Edema: None  Mental Status:  Normal mood and affect. Normal behavior. Normal judgment and thought content.   Assessment and Plan:  Pregnancy: [name] at [redacted]w[redacted]d  1. [redacted] weeks gestation of pregnancy (Primary)   2. Supervision of high risk pregnancy,  antepartum Continue routine prenatal care  3. Obesity affecting pregnancy in third trimester, unspecified obesity type   4. Language barrier Live interpreter present  5. History of preterm delivery, currently pregnant No s/sx of preterm labor  6. Grand multiparity with current pregnancy in third trimester   7. Excessive fetal growth affecting management of pregnancy in third trimester, single or unspecified fetus Fetus measured and is 3-4 cm larger than expected on fundal height Fetus feels large, growth scan in 1-2 weeks, may need to be delivered at 37 weeks  8. BMI 40.0-44.9, adult (HCC)   9. AMA (advanced maternal age) multigravida 35+, third trimester   Preterm labor symptoms and general obstetric precautions including but not limited to vaginal bleeding, contractions, leaking of fluid and fetal movement were reviewed in detail with the patient.  Please refer to After Visit Summary for other counseling recommendations.   Return in about 1 week (around 04/27/2024) for [facility], in person, 36 weeks swabs.   Jerilynn Buddle, MD Faculty Attending Center for [facility]

## 2024-04-24 ENCOUNTER — Ambulatory Visit (INDEPENDENT_AMBULATORY_CARE_PROVIDER_SITE_OTHER): Payer: Self-pay | Admitting: Obstetrics & Gynecology

## 2024-04-24 ENCOUNTER — Other Ambulatory Visit: Payer: Self-pay

## 2024-04-24 ENCOUNTER — Other Ambulatory Visit (HOSPITAL_COMMUNITY)
Admission: RE | Admit: 2024-04-24 | Discharge: 2024-04-24 | Disposition: A | Discharge status: Home / Self Care | Payer: Self-pay | Source: Ambulatory Visit | Attending: Obstetrics & Gynecology | Admitting: Obstetrics & Gynecology

## 2024-04-24 ENCOUNTER — Encounter: Payer: Self-pay | Admitting: Obstetrics & Gynecology

## 2024-04-24 VITALS — BP 132/83 | HR 98 | Wt 223.4 lb

## 2024-04-24 DIAGNOSIS — O0990 Supervision of high risk pregnancy, unspecified, unspecified trimester: Secondary | ICD-10-CM

## 2024-04-24 DIAGNOSIS — Z3A35 35 weeks gestation of pregnancy: Secondary | ICD-10-CM

## 2024-04-24 DIAGNOSIS — O0993 Supervision of high risk pregnancy, unspecified, third trimester: Secondary | ICD-10-CM

## 2024-04-24 DIAGNOSIS — O3663X0 Maternal care for excessive fetal growth, third trimester, not applicable or unspecified: Secondary | ICD-10-CM

## 2024-04-24 DIAGNOSIS — O09523 Supervision of elderly multigravida, third trimester: Secondary | ICD-10-CM

## 2024-04-24 DIAGNOSIS — O99213 Obesity complicating pregnancy, third trimester: Secondary | ICD-10-CM

## 2024-04-24 NOTE — Progress Notes (Unsigned)
 PRENATAL VISIT NOTE  Subjective:  Shannon Myers is a 38 y.o. [name] at [redacted]w[redacted]d being seen today for ongoing prenatal care.  She is currently monitored for the following issues for this high-risk pregnancy and has Obesity affecting pregnancy in third trimester; History of preterm delivery, currently pregnant; Language barrier; Grand multiparity with current pregnancy in third trimester; AMA (advanced maternal age) multigravida 35+, third trimester; Supervision of high risk pregnancy, antepartum; BMI 40.0-44.9, adult (HCC); and Excessive fetal growth affecting management of mother in third trimester, antepartum on their problem list.  Patient reports no complaints.  Contractions: Irritability. Vag. Bleeding: None.  Movement: Present. Denies leaking of fluid.   The following portions of the patient's history were reviewed and updated as appropriate: allergies, current medications, past family history, past medical history, past social history, past surgical history and problem list.   Objective:   Vitals:   04/24/24 1324  BP: 132/83  Pulse: 98  Weight: 223 lb 6.4 oz (101.3 kg)    Fetal Status:  Fetal Heart Rate (bpm): 146   Movement: Present    General: Alert, oriented and cooperative. Patient is in no acute distress.  Skin: Skin is warm and dry. No rash noted.   Cardiovascular: Normal heart rate noted  Respiratory: Normal respiratory effort, no problems with respiration noted  Abdomen: Soft, gravid, appropriate for gestational age.  Pain/Pressure: Present     Pelvic: Cervical exam performed in the presence of a chaperone  Fetal head palpated, but cervix is still high and closed.     Extremities: Normal range of motion.  Edema: Trace  Mental Status: Normal mood and affect. Normal behavior. Normal judgment and thought content.      04/30/2021    9:30 AM 04/21/2021    1:35 PM 03/31/2021   10:36 AM  Depression screen PHQ 2/9  Decreased Interest 0 0 0  Down, Depressed, Hopeless 0  0 0  PHQ - 2 Score 0 0 0  Altered sleeping 0 0 0  Tired, decreased energy 0 0 1  Change in appetite 0 0 0  Feeling bad or failure about yourself  0 0 0  Trouble concentrating 0 0 0  Moving slowly or fidgety/restless 0 0 0  Suicidal thoughts 0 0 0  PHQ-9 Score 0  0  1      Data saved with a previous flowsheet row definition        04/30/2021    9:30 AM 04/21/2021    1:35 PM 03/31/2021   10:36 AM 03/17/2021    4:30 PM  GAD 7 : Generalized Anxiety Score  Nervous, Anxious, on Edge 0 0 1 0  Control/stop worrying 0 0 0 0  Worry too much - different things 0 0 0 0  Trouble relaxing 0 0 0 0  Restless 0 0 0 0  Easily annoyed or irritable 0 0 1 0  Afraid - awful might happen 0 0 0 0  Total GAD 7 Score 0 0 2 0    Assessment and Plan:  Pregnancy: [name] at [redacted]w[redacted]d 1. Supervision of high risk pregnancy, antepartum (Primary) - GC/Chlamydia probe amp ([place])not at [facility] - Culture, beta strep (group b only) - Plan for delivery as per FMF.  2. AMA (advanced maternal age) multigravida 35+, third trimester   3. Obesity affecting pregnancy in third trimester, unspecified obesity type   4. Excessive fetal growth affecting management of pregnancy in third trimester, single or unspecified fetus - Ultrasound scheduled for 12/2.  Preterm labor symptoms and general obstetric precautions including but not limited to vaginal bleeding, contractions, leaking of fluid and fetal movement were reviewed in detail with the patient. Please refer to After Visit Summary for other counseling recommendations.   Return in about 1 week (around 05/01/2024).  Future Appointments  Date Time Provider Department Center  04/25/2024  1:15 PM [facility] NST [facility]  05/02/2024 11:15 AM [facility] PROVIDER 1 [facility]  05/02/2024 11:30 AM [facility] US2 [facility]  05/04/2024  1:15 PM Lola Donnice HERO, MD [facility]  05/09/2024  1:15 PM [facility] NST [facility]  05/11/2024  1:15 PM Anyanwu, Gloris LABOR, MD  [facility]  05/16/2024  1:15 PM [facility] NST [facility]  05/18/2024 10:35 AM Jerilynn Longs, NP [facility]  05/23/2024 10:15 AM Jerilynn Longs, NP [facility]    Nunzio LABOR Cena Verma, Medical Student   Attestation of Attending Supervision of Medical Student: Evaluation and management procedures were performed by the medical student under my supervision and collaboration.  I have reviewed the student's note and chart, and I agree with the management and plan.  LYNWOOD SOLOMONS, MD, FACOG Attending Obstetrician & Gynecologist Faculty Practice, [facility]

## 2024-04-25 ENCOUNTER — Ambulatory Visit: Payer: Self-pay | Attending: Maternal & Fetal Medicine

## 2024-04-25 DIAGNOSIS — O99213 Obesity complicating pregnancy, third trimester: Secondary | ICD-10-CM

## 2024-04-25 DIAGNOSIS — O0943 Supervision of pregnancy with grand multiparity, third trimester: Secondary | ICD-10-CM | POA: Insufficient documentation

## 2024-04-25 DIAGNOSIS — Z3A35 35 weeks gestation of pregnancy: Secondary | ICD-10-CM | POA: Insufficient documentation

## 2024-04-25 DIAGNOSIS — E669 Obesity, unspecified: Secondary | ICD-10-CM

## 2024-04-25 DIAGNOSIS — O09523 Supervision of elderly multigravida, third trimester: Secondary | ICD-10-CM | POA: Insufficient documentation

## 2024-04-25 LAB — GC/CHLAMYDIA PROBE AMP (~~LOC~~) NOT AT ARMC
Chlamydia: NEGATIVE
Comment: NEGATIVE
Comment: NORMAL
Neisseria Gonorrhea: NEGATIVE

## 2024-04-25 NOTE — Procedures (Signed)
 Shannon Myers 08-07-1985 [redacted]w[redacted]d  Fetus A Non-Stress Test Interpretation for 04/25/24  Indication: Advanced Maternal Age >40 years and LGA - NST only  Fetal Heart Rate A Mode: External Baseline Rate (A): 135 bpm Variability: Moderate Accelerations: 15 x 15 Decelerations: None Multiple birth?: No  Uterine Activity Mode: Toco, Palpation Contraction Frequency (min): irritability noted Resting Tone Palpated: Relaxed  Interpretation (Fetal Testing) Nonstress Test Interpretation: Reactive Comments: Reviewed with Dr. Ileana

## 2024-04-27 LAB — CULTURE, BETA STREP (GROUP B ONLY): Strep Gp B Culture: POSITIVE — AB

## 2024-05-02 ENCOUNTER — Ambulatory Visit: Payer: Self-pay

## 2024-05-02 ENCOUNTER — Ambulatory Visit: Payer: Self-pay | Attending: Maternal & Fetal Medicine | Admitting: Maternal & Fetal Medicine

## 2024-05-02 DIAGNOSIS — O3663X0 Maternal care for excessive fetal growth, third trimester, not applicable or unspecified: Secondary | ICD-10-CM | POA: Insufficient documentation

## 2024-05-02 DIAGNOSIS — O09213 Supervision of pregnancy with history of pre-term labor, third trimester: Secondary | ICD-10-CM | POA: Insufficient documentation

## 2024-05-02 DIAGNOSIS — Z3A36 36 weeks gestation of pregnancy: Secondary | ICD-10-CM | POA: Insufficient documentation

## 2024-05-02 DIAGNOSIS — O99213 Obesity complicating pregnancy, third trimester: Secondary | ICD-10-CM | POA: Insufficient documentation

## 2024-05-02 DIAGNOSIS — O0943 Supervision of pregnancy with grand multiparity, third trimester: Secondary | ICD-10-CM | POA: Insufficient documentation

## 2024-05-02 DIAGNOSIS — O3660X0 Maternal care for excessive fetal growth, unspecified trimester, not applicable or unspecified: Secondary | ICD-10-CM | POA: Insufficient documentation

## 2024-05-02 DIAGNOSIS — O09523 Supervision of elderly multigravida, third trimester: Secondary | ICD-10-CM | POA: Insufficient documentation

## 2024-05-02 DIAGNOSIS — O09293 Supervision of pregnancy with other poor reproductive or obstetric history, third trimester: Secondary | ICD-10-CM | POA: Insufficient documentation

## 2024-05-02 NOTE — Progress Notes (Signed)
 Patient information  Patient Name: Shannon Myers  Patient MRN:   [id number]  Referring practice: MFM Referring Provider: [facility])  Problem List   Patient Active Problem List   Diagnosis Date Noted   LGA (large for gestational age) fetus affecting mother, antepartum 04/06/2024   BMI 40.0-44.9, adult (HCC) 12/31/2023   AMA (advanced maternal age) multigravida 35+, third trimester 12/30/2023   Supervision of high risk pregnancy, antepartum 12/30/2023   Grand multiparity with current pregnancy in third trimester 12/22/2023   History of preterm delivery, currently pregnant 10/08/2020   Language barrier 10/08/2020   Obesity affecting pregnancy in third trimester    Maternal Fetal medicine Consult  Davi AKILA BATTA is a 38 y.o. [name] at [redacted]w[redacted]d here for ultrasound and consultation. Zacaria PEREZ SEVERO is doing well today with no acute concerns. Today we focused on the following:   LGA The estimated fetal weight on today's ultrasound is consistent with large for gestational age (LGA) in the 95% overall. I discussed the diagnosis, management, and clinical significance of an LGA fetus. Risk factors for LGA include, but are not limited to, constitutional factors, pre-existing or gestational diabetes, maternal pre-pregnancy obesity, excessive gestational weight gain, abnormal fasting or postprandial blood glucose levels, dyslipidemia, a history of prior macrosomic newborns (defined as birth weight over 4000 g), and post-term pregnancy. Racial and ethnic factors may also contribute. Diagnosis is made via prenatal ultrasound; however, the prediction of neonatal weight remains imprecise, with an estimated variance of up to 20% between the expected and actual birth weight. Notably, ultrasound accuracy declines as fetal weight exceeds 4000 g, making precise estimation increasingly difficult. LGA serves as a surrogate marker for fetal macrosomia, which is defined by an  absolute birth weight of over 4000 g, regardless of gestational age. The estimated fetal weight at the time of delivery may influence the mode of delivery. In the absence of gestational diabetes, cesarean delivery should be considered if the estimated fetal weight exceeds 5000 g to reduce the risk of higher-order perineal lacerations, which may lead to urinary or fecal incontinence, pelvic organ prolapse, and shoulder dystocia--with or without permanent fetal neurologic injury or, in rare cases, fetal death.  I also discussed that some patients choose a cesarean delivery in this setting but it is not recommended.  She expressed understanding and prefers a vaginal birth and understands the potential risks associated with an LGA fetus.  She will continue to have weekly nonstress tests.  The patient had time to ask questions that were answered to her satisfaction.  She verbalized understanding and agrees to proceed with the plan below.  Standard OB precautions were given to the patient when appropriate based on the gestational age (fetal kick counts, importance of attending prenatal visits, any antenatal testing or ultrasounds that are scheduled as well as monitoring for signs and symptoms that is labor, vaginal bleeding, loss of amniotic fluid, or decreased fetal movement).  Recommendations - Continue weekly NSTs.  Delivery at 39 weeks due to LGA fetus to minimize risk of maternal/fetal trauma associated with excessive fetal growth in the setting of a vaginal delivery. - The growth curve should be followed closely and if there is concern for protracted labor or failure to descend then a cesarean delivery should be considered.  An in person interpreter was used in the patient's language of preference for today's visit.  I spent 30 minutes reviewing the patients chart, including labs and images as well as counseling  the patient about her medical conditions. Greater than 50% of the time was spent in direct  face-to-face patient counseling.  Delora Smaller  MFM, [place]   05/02/2024  2:36 PM   Review of Systems: A review of systems was performed and was negative except per HPI   Vitals and Physical Exam    05/02/2024   11:20 AM 04/24/2024    1:24 PM 04/20/2024    1:17 PM  Vitals with BMI  Weight  223 lbs 6 oz 221 lbs 6 oz  Systolic 130 132 879  Diastolic 71 83 79  Pulse 82 98 91    Sitting comfortably on the sonogram table Nonlabored breathing Normal rate and rhythm Abdomen is nontender  Past pregnancies OB History  Gravida Para Term Preterm AB Living  10 8 7 1 1 8   SAB IAB Ectopic Multiple Live Births  1 0 0 1 8    # Outcome Date GA Lbr Len/2nd Weight Sex Type Anes PTL Lv  10A Gravida           10B Current           9 Term 05/06/21 [redacted]w[redacted]d 06:16 7 lb 4.1 oz (3.29 kg) F Vag-Spont EPI  LIV     Birth Comments: WNL  8 SAB 07/2020          7 Term 01/09/17 [redacted]w[redacted]d 09:08 5 lb 14.2 oz (2.67 kg) F Vag-Spont None N LIV  6 Term 11/12/14 [redacted]w[redacted]d  6 lb 7.7 oz (2.94 kg) M Vag-Spont None  LIV     Birth Comments: none- precepitous delivery- delivered in hallway  5 Term 02/02/13 [redacted]w[redacted]d 07:32 / 00:26 6 lb 1.7 oz (2.77 kg) F Vag-Spont None  LIV     Birth Comments: anemia  4 Term 03/11/12 [redacted]w[redacted]d   M Vag-Spont EPI  LIV  3 Term 07/2006 [redacted]w[redacted]d   M Vag-Spont EPI  LIV     Birth Comments: got weekly shots  to prevent early delivery  2 Preterm 09/2003 [redacted]w[redacted]d   M Vag-Spont EPI Y LIV     Birth Comments: [name] was in NICU  1 Term 05/2001 [redacted]w[redacted]d  5 lb (2.268 kg)     LIV     Birth Comments: no complications, born in [place]     Future Appointments  Date Time Provider Department Center  05/04/2024  1:15 PM Lola Donnice HERO, MD [facility]  05/09/2024  1:15 PM [facility] NST [facility]  05/11/2024  1:15 PM Anyanwu, Gloris LABOR, MD [facility]  05/16/2024  1:15 PM [facility] NST [facility]  05/18/2024 10:35 AM Jerilynn Longs, NP [facility]  05/23/2024 10:15 AM Jerilynn Longs, NP [facility]

## 2024-05-04 ENCOUNTER — Ambulatory Visit: Payer: Self-pay

## 2024-05-04 ENCOUNTER — Encounter (HOSPITAL_COMMUNITY): Payer: Self-pay | Admitting: Family Medicine

## 2024-05-04 ENCOUNTER — Inpatient Hospital Stay (HOSPITAL_COMMUNITY)
Admission: AD | Admit: 2024-05-04 | Discharge: 2024-05-04 | Disposition: A | Discharge status: Home / Self Care | Payer: Self-pay | Attending: Family Medicine | Admitting: Family Medicine

## 2024-05-04 ENCOUNTER — Other Ambulatory Visit: Payer: Self-pay

## 2024-05-04 VITALS — BP 126/88 | HR 93 | Wt 223.7 lb

## 2024-05-04 DIAGNOSIS — Z3A37 37 weeks gestation of pregnancy: Secondary | ICD-10-CM

## 2024-05-04 DIAGNOSIS — Z3689 Encounter for other specified antenatal screening: Secondary | ICD-10-CM

## 2024-05-04 DIAGNOSIS — O479 False labor, unspecified: Secondary | ICD-10-CM

## 2024-05-04 DIAGNOSIS — O0990 Supervision of high risk pregnancy, unspecified, unspecified trimester: Secondary | ICD-10-CM

## 2024-05-04 NOTE — Progress Notes (Signed)
   PRENATAL VISIT NOTE  Subjective:  Shannon Myers is a 38 y.o. [name] at [redacted]w[redacted]d being seen today for ongoing prenatal care.  She is currently monitored for the following issues for this high-risk pregnancy and has Obesity affecting pregnancy in third trimester; History of preterm delivery, currently pregnant; Language barrier; Grand multiparity with current pregnancy in third trimester; AMA (advanced maternal age) multigravida 35+, third trimester; Supervision of high risk pregnancy, antepartum; BMI 40.0-44.9, adult (HCC); and LGA (large for gestational age) fetus affecting mother, antepartum on their problem list.  Patient reports contractions since last night and no fetal movement since last night.  Contractions: Irritability. Vag. Bleeding: None.  Movement: Absent. Denies leaking of fluid.   The following portions of the patient's history were reviewed and updated as appropriate: allergies, current medications, past family history, past medical history, past social history, past surgical history and problem list.   Objective:    Vitals:   05/04/24 1339  BP: 126/88  Pulse: 93  Weight: 223 lb 11.2 oz (101.5 kg)    Fetal Status:      Movement: Absent    General: Alert, oriented and cooperative. Patient is in no acute distress.  Skin: Skin is warm and dry. No rash noted.   Cardiovascular: Normal heart rate noted  Respiratory: Normal respiratory effort, no problems with respiration noted  Abdomen: Soft, gravid, appropriate for gestational age.  Pain/Pressure: Present     Pelvic: Cervical exam deferred        Extremities: Normal range of motion.  Edema: Trace  Mental Status: Normal mood and affect. Normal behavior. Normal judgment and thought content.   Assessment and Plan:  Pregnancy: [name] at [redacted]w[redacted]d  1. Supervision of high risk pregnancy, antepartum (Primary) -BSUS completed due to difficulty with Dopplers, FHR 105bpm. Given low FHR and painful contractions q3-26min per  patient, she was advised to present to MAU for further evaluation. Spanish interpreter present for encounter.  Term labor symptoms and general obstetric precautions including but not limited to vaginal bleeding, contractions, leaking of fluid and fetal movement were reviewed in detail with the patient. Please refer to After Visit Summary for other counseling recommendations.   Return in about 1 week (around 05/11/2024) for Routine prenatal visit.  Future Appointments  Date Time Provider Department Center  05/09/2024  1:15 PM [facility] NST [facility]  05/11/2024  1:15 PM Herchel Gloris LABOR, MD [facility]  05/16/2024  1:15 PM [facility] NST [facility]  05/18/2024 10:35 AM Jerilynn Longs, NP [facility]  05/23/2024 10:15 AM Jerilynn Longs, NP [facility]    Charlie LABOR Courts, MD

## 2024-05-04 NOTE — MAU Note (Signed)
 MAU Triage Note  Shannon Myers is a 38 y.o. at [redacted]w[redacted]d here in MAU reporting: sent from office; reports FHR @ the office was low. Reports intermittent ctx, reports they are q14min then go away for approx 2 hours, and come back q65min. Reports baby is moving less than normal today, denies VB and LOF.   Onset of complaint: ctx yesterday; DFM lastnight Pain score: 10/10 ctx; 10/10 calf Vitals:   05/04/24 1502  BP: 121/72  Pulse: (!) 101  Resp: 17  Temp: 98.5 F (36.9 C)  SpO2: 100%     FHT: 144  Lab orders placed from triage: none; urine collected

## 2024-05-04 NOTE — MAU Provider Note (Signed)
 History     CSN: [id number]  Arrival date and time: 05/04/24 1435   Event Date/Time   First Provider Initiated Contact with Patient 05/04/24 1610      Chief Complaint  Patient presents with   Decreased Fetal Movement   Contractions   HPI Shannon Myers is a 38 y.o. [name] at [redacted]w[redacted]d who presents for decreased fetal movement & contractions. Was in office today & FHR was low 100s, was sent to MAU for monitoring.  Reports baby moving just not as strong as normal.  Reports contractions every 5 minutes since yesterday. Regular contractions come & go after a few hours. Denies LOF or vaginal bleeding.   OB History     Gravida  10   Para  8   Term  7   Preterm  1   AB  1   Living  8      SAB  1   IAB  0   Ectopic  0   Multiple  1   Live Births  8           Past Medical History:  Diagnosis Date   Anemia    History of preterm delivery, currently pregnant 10/08/2020    Past Surgical History:  Procedure Laterality Date   NO PAST SURGERIES      Family History  Problem Relation Age of Onset   Diabetes Mother    Hypertension Mother    Healthy Father    Alcohol abuse Neg Hx    Asthma Neg Hx    Arthritis Neg Hx    Birth defects Neg Hx    Cancer Neg Hx    COPD Neg Hx    Depression Neg Hx    Drug abuse Neg Hx    Early death Neg Hx    Hearing loss Neg Hx    Heart disease Neg Hx    Hyperlipidemia Neg Hx    Kidney disease Neg Hx    Learning disabilities Neg Hx    Mental illness Neg Hx    Mental retardation Neg Hx    Miscarriages / Stillbirths Neg Hx    Stroke Neg Hx    Vision loss Neg Hx     Social History   Tobacco Use   Smoking status: Never   Smokeless tobacco: Never  Vaping Use   Vaping status: Never Used  Substance Use Topics   Alcohol use: No   Drug use: No    Allergies: No Known Allergies  No medications prior to admission.    Review of Systems  All other systems reviewed and are negative.  Physical Exam   Blood  pressure 137/83, pulse 89, temperature 98.5 F (36.9 C), temperature source Oral, resp. rate 17, height 4' 11 (1.499 m), last menstrual period 09/03/2023, SpO2 98%, unknown if currently breastfeeding.  Physical Exam Vitals and nursing note reviewed. Exam conducted with a chaperone present.  Constitutional:      General: She is not in acute distress.    Appearance: Normal appearance. She is not ill-appearing.  HENT:     Head: Normocephalic and atraumatic.  Eyes:     General: No scleral icterus.    Pupils: Pupils are equal, round, and reactive to light.  Pulmonary:     Effort: Pulmonary effort is normal. No respiratory distress.  Abdominal:     Tenderness: There is no abdominal tenderness.     Comments: gravid  Skin:    General: Skin is warm and dry.  Neurological:  Mental Status: She is alert.    Dilation: 1.5 Effacement (%): 50 Presentation: Undeterminable Exam by:: GEANNIE Punter, RN  NST:  Baseline: 140 bpm, Variability: Good {> 6 bpm), Accelerations: Reactive, and Decelerations: Absent   MAU Course  Procedures No results found for this or any previous visit (from the past 24 hours).  MDM   Assessment and Plan   1. False labor   2. NST (non-stress test) reactive   3. [redacted] weeks gestation of pregnancy    -SVE unchanged after 1+ hour of monitoring. Reviewed labor precautions -Fetal tracing reactive with normal baseline. Reports feeling baby move. Reviewed kick count.   Spanish interpreter used for this encounter.    Rocky Satterfield 05/04/2024, 6:38 PM

## 2024-05-09 ENCOUNTER — Ambulatory Visit: Payer: Self-pay | Attending: Maternal & Fetal Medicine

## 2024-05-09 VITALS — BP 125/71 | HR 84

## 2024-05-09 DIAGNOSIS — Z3A37 37 weeks gestation of pregnancy: Secondary | ICD-10-CM

## 2024-05-09 DIAGNOSIS — O3660X0 Maternal care for excessive fetal growth, unspecified trimester, not applicable or unspecified: Secondary | ICD-10-CM

## 2024-05-09 NOTE — Procedures (Signed)
 Shannon Myers 1985-12-20 [redacted]w[redacted]d  Fetus A Non-Stress Test Interpretation for 05/09/24  Indication: Large for Gestational Age - NST only  Fetal Heart Rate A Mode: External Baseline Rate (A): 140 bpm Variability: Moderate Accelerations: 15 x 15 Decelerations: None Multiple birth?: No  Uterine Activity Mode: Palpation, Toco Contraction Frequency (min): 10 mins Contraction Duration (sec): 80-90 Contraction Quality:  (non-palpable) Resting Tone Palpated: Relaxed  Interpretation (Fetal Testing) Nonstress Test Interpretation: Reactive Comments: Reviewed with Dr. William

## 2024-05-11 ENCOUNTER — Inpatient Hospital Stay (HOSPITAL_COMMUNITY): Payer: MEDICAID | Admitting: Anesthesiology

## 2024-05-11 ENCOUNTER — Inpatient Hospital Stay (HOSPITAL_COMMUNITY)
Admission: AD | Admit: 2024-05-11 | Discharge: 2024-05-12 | DRG: 807 | Disposition: A | Discharge status: Home / Self Care | Payer: MEDICAID | Attending: Family Medicine | Admitting: Family Medicine

## 2024-05-11 ENCOUNTER — Encounter (HOSPITAL_COMMUNITY): Payer: Self-pay | Admitting: Family Medicine

## 2024-05-11 ENCOUNTER — Other Ambulatory Visit: Payer: Self-pay

## 2024-05-11 ENCOUNTER — Encounter: Payer: Self-pay | Admitting: Obstetrics & Gynecology

## 2024-05-11 DIAGNOSIS — O3660X0 Maternal care for excessive fetal growth, unspecified trimester, not applicable or unspecified: Secondary | ICD-10-CM | POA: Diagnosis present

## 2024-05-11 DIAGNOSIS — Z7982 Long term (current) use of aspirin: Secondary | ICD-10-CM

## 2024-05-11 DIAGNOSIS — O3663X0 Maternal care for excessive fetal growth, third trimester, not applicable or unspecified: Secondary | ICD-10-CM | POA: Diagnosis present

## 2024-05-11 DIAGNOSIS — Z8249 Family history of ischemic heart disease and other diseases of the circulatory system: Secondary | ICD-10-CM

## 2024-05-11 DIAGNOSIS — O368130 Decreased fetal movements, third trimester, not applicable or unspecified: Secondary | ICD-10-CM | POA: Diagnosis not present

## 2024-05-11 DIAGNOSIS — O99214 Obesity complicating childbirth: Secondary | ICD-10-CM | POA: Diagnosis present

## 2024-05-11 DIAGNOSIS — O99820 Streptococcus B carrier state complicating pregnancy: Secondary | ICD-10-CM | POA: Diagnosis not present

## 2024-05-11 DIAGNOSIS — E66813 Obesity, class 3: Secondary | ICD-10-CM | POA: Diagnosis present

## 2024-05-11 DIAGNOSIS — O09523 Supervision of elderly multigravida, third trimester: Secondary | ICD-10-CM | POA: Diagnosis present

## 2024-05-11 DIAGNOSIS — O99824 Streptococcus B carrier state complicating childbirth: Secondary | ICD-10-CM | POA: Diagnosis present

## 2024-05-11 DIAGNOSIS — O0943 Supervision of pregnancy with grand multiparity, third trimester: Secondary | ICD-10-CM | POA: Diagnosis present

## 2024-05-11 DIAGNOSIS — Z3A38 38 weeks gestation of pregnancy: Secondary | ICD-10-CM | POA: Diagnosis not present

## 2024-05-11 DIAGNOSIS — O0990 Supervision of high risk pregnancy, unspecified, unspecified trimester: Secondary | ICD-10-CM

## 2024-05-11 DIAGNOSIS — O4292 Full-term premature rupture of membranes, unspecified as to length of time between rupture and onset of labor: Secondary | ICD-10-CM | POA: Diagnosis present

## 2024-05-11 DIAGNOSIS — Z833 Family history of diabetes mellitus: Secondary | ICD-10-CM | POA: Diagnosis not present

## 2024-05-11 DIAGNOSIS — Z603 Acculturation difficulty: Secondary | ICD-10-CM | POA: Diagnosis present

## 2024-05-11 LAB — CBC
HCT: 37.6 % (ref 36.0–46.0)
Hemoglobin: 12.5 g/dL (ref 12.0–15.0)
MCH: 27.7 pg (ref 26.0–34.0)
MCHC: 33.2 g/dL (ref 30.0–36.0)
MCV: 83.4 fL (ref 80.0–100.0)
Platelets: 216 K/uL (ref 150–400)
RBC: 4.51 MIL/uL (ref 3.87–5.11)
RDW: 14.5 % (ref 11.5–15.5)
WBC: 9.9 K/uL (ref 4.0–10.5)
nRBC: 0 % (ref 0.0–0.2)

## 2024-05-11 LAB — SYPHILIS: RPR W/REFLEX TO RPR TITER AND TREPONEMAL ANTIBODIES, TRADITIONAL SCREENING AND DIAGNOSIS ALGORITHM: RPR Ser Ql: NONREACTIVE

## 2024-05-11 LAB — POCT FERN TEST: POCT Fern Test: POSITIVE

## 2024-05-11 MED ORDER — OXYTOCIN-SODIUM CHLORIDE 30-0.9 UT/500ML-% IV SOLN: 1.0000 m[IU]/min | INTRAVENOUS | Status: DC

## 2024-05-11 MED ORDER — DIBUCAINE (PERIANAL) 1 % EX OINT: 1.0000 | TOPICAL_OINTMENT | CUTANEOUS | Status: DC | PRN

## 2024-05-11 MED ORDER — SIMETHICONE 80 MG PO CHEW: 80.0000 mg | CHEWABLE_TABLET | ORAL | Status: DC | PRN

## 2024-05-11 MED ORDER — LIDOCAINE HCL (PF) 1 % IJ SOLN: 30.0000 mL | INTRAMUSCULAR | Status: DC | PRN

## 2024-05-11 MED ORDER — ONDANSETRON HCL 4 MG PO TABS: 4.0000 mg | ORAL_TABLET | ORAL | Status: DC | PRN

## 2024-05-11 MED ORDER — FLEET ENEMA RE ENEM: 1.0000 | ENEMA | RECTAL | Status: DC | PRN

## 2024-05-11 MED ORDER — EPHEDRINE 5 MG/ML INJ: 10.0000 mg | INTRAVENOUS | Status: DC | PRN

## 2024-05-11 MED ORDER — MEASLES, MUMPS & RUBELLA VAC ~~LOC~~ SUSR: 0.5000 mL | Freq: Once | SUBCUTANEOUS | Status: DC

## 2024-05-11 MED ORDER — COCONUT OIL OIL: 1.0000 | TOPICAL_OIL | Status: DC | PRN

## 2024-05-11 MED ORDER — PHENYLEPHRINE 80 MCG/ML (10ML) SYRINGE FOR IV PUSH (FOR BLOOD PRESSURE SUPPORT): 80.0000 ug | PREFILLED_SYRINGE | INTRAVENOUS | Status: DC | PRN

## 2024-05-11 MED ORDER — ACETAMINOPHEN 325 MG PO TABS: 650.0000 mg | ORAL_TABLET | ORAL | Status: DC | PRN

## 2024-05-11 MED ORDER — LACTATED RINGERS IV SOLN: INTRAVENOUS | Status: DC

## 2024-05-11 MED ORDER — LACTATED RINGERS IV SOLN
500.0000 mL | Freq: Once | INTRAVENOUS | Status: AC
  Administered 2024-05-11: 500 mL via INTRAVENOUS

## 2024-05-11 MED ORDER — WITCH HAZEL-GLYCERIN EX PADS: 1.0000 | MEDICATED_PAD | CUTANEOUS | Status: DC | PRN

## 2024-05-11 MED ORDER — ACETAMINOPHEN 500 MG PO TABS
1000.0000 mg | ORAL_TABLET | Freq: Four times a day (QID) | ORAL | Status: DC | PRN
  Administered 2024-05-11: 1000 mg via ORAL
  Filled 2024-05-11: qty 2

## 2024-05-11 MED ORDER — ONDANSETRON HCL 4 MG/2ML IJ SOLN: 4.0000 mg | INTRAMUSCULAR | Status: DC | PRN

## 2024-05-11 MED ORDER — SOD CITRATE-CITRIC ACID 500-334 MG/5ML PO SOLN: 30.0000 mL | ORAL | Status: DC | PRN

## 2024-05-11 MED ORDER — OXYTOCIN BOLUS FROM INFUSION
333.0000 mL | Freq: Once | INTRAVENOUS | Status: AC
  Administered 2024-05-11: 333 mL via INTRAVENOUS

## 2024-05-11 MED ORDER — OXYCODONE-ACETAMINOPHEN 5-325 MG PO TABS: 1.0000 | ORAL_TABLET | ORAL | Status: DC | PRN

## 2024-05-11 MED ORDER — IBUPROFEN 600 MG PO TABS
600.0000 mg | ORAL_TABLET | Freq: Four times a day (QID) | ORAL | Status: DC
  Administered 2024-05-11 – 2024-05-12 (×5): 600 mg via ORAL
  Filled 2024-05-11 (×5): qty 1

## 2024-05-11 MED ORDER — TERBUTALINE SULFATE 1 MG/ML IJ SOLN: 0.2500 mg | Freq: Once | INTRAMUSCULAR | Status: DC | PRN

## 2024-05-11 MED ORDER — SENNOSIDES-DOCUSATE SODIUM 8.6-50 MG PO TABS
2.0000 | ORAL_TABLET | Freq: Every day | ORAL | Status: DC
  Administered 2024-05-12: 2 via ORAL
  Filled 2024-05-11: qty 2

## 2024-05-11 MED ORDER — TRANEXAMIC ACID-NACL 1000-0.7 MG/100ML-% IV SOLN
INTRAVENOUS | Status: AC
  Administered 2024-05-11: 1000 mg via INTRAVENOUS
  Filled 2024-05-11: qty 100

## 2024-05-11 MED ORDER — DIPHENHYDRAMINE HCL 50 MG/ML IJ SOLN: 12.5000 mg | INTRAMUSCULAR | Status: DC | PRN

## 2024-05-11 MED ORDER — OXYTOCIN-SODIUM CHLORIDE 30-0.9 UT/500ML-% IV SOLN
2.5000 [IU]/h | INTRAVENOUS | Status: DC
  Filled 2024-05-11: qty 500

## 2024-05-11 MED ORDER — DIPHENHYDRAMINE HCL 25 MG PO CAPS: 25.0000 mg | ORAL_CAPSULE | Freq: Four times a day (QID) | ORAL | Status: DC | PRN

## 2024-05-11 MED ORDER — LIDOCAINE HCL (PF) 1 % IJ SOLN
INTRAMUSCULAR | Status: DC | PRN
  Administered 2024-05-11 (×2): 4 mL via EPIDURAL

## 2024-05-11 MED ORDER — FENTANYL-BUPIVACAINE-NACL 0.5-0.125-0.9 MG/250ML-% EP SOLN
12.0000 mL/h | EPIDURAL | Status: DC | PRN
  Administered 2024-05-11: 10 mL/h via EPIDURAL
  Filled 2024-05-11: qty 250

## 2024-05-11 MED ORDER — TETANUS-DIPHTH-ACELL PERTUSSIS 5-2-15.5 LF-MCG/0.5 IM SUSP: 0.5000 mL | Freq: Once | INTRAMUSCULAR | Status: DC

## 2024-05-11 MED ORDER — LACTATED RINGERS IV SOLN
500.0000 mL | INTRAVENOUS | Status: DC | PRN
  Administered 2024-05-11: 500 mL via INTRAVENOUS

## 2024-05-11 MED ORDER — PENICILLIN G POT IN DEXTROSE 60000 UNIT/ML IV SOLN
3.0000 10*6.[IU] | INTRAVENOUS | Status: DC
  Administered 2024-05-11: 3 10*6.[IU] via INTRAVENOUS
  Filled 2024-05-11: qty 50

## 2024-05-11 MED ORDER — ONDANSETRON HCL 4 MG/2ML IJ SOLN: 4.0000 mg | Freq: Four times a day (QID) | INTRAMUSCULAR | Status: DC | PRN

## 2024-05-11 MED ORDER — SODIUM CHLORIDE 0.9 % IV SOLN
5.0000 10*6.[IU] | Freq: Once | INTRAVENOUS | Status: AC
  Administered 2024-05-11: 5 10*6.[IU] via INTRAVENOUS
  Filled 2024-05-11: qty 5

## 2024-05-11 MED ORDER — TRANEXAMIC ACID-NACL 1000-0.7 MG/100ML-% IV SOLN
1000.0000 mg | INTRAVENOUS | Status: AC
  Filled 2024-05-11: qty 100

## 2024-05-11 MED ORDER — OXYCODONE-ACETAMINOPHEN 5-325 MG PO TABS: 2.0000 | ORAL_TABLET | ORAL | Status: DC | PRN

## 2024-05-11 MED ORDER — BENZOCAINE-MENTHOL 20-0.5 % EX AERO: 1.0000 | INHALATION_SPRAY | CUTANEOUS | Status: DC | PRN

## 2024-05-11 MED ORDER — PRENATAL MULTIVITAMIN CH
1.0000 | ORAL_TABLET | Freq: Every day | ORAL | Status: DC
  Administered 2024-05-11 – 2024-05-12 (×2): 1 via ORAL
  Filled 2024-05-11 (×2): qty 1

## 2024-05-11 NOTE — Anesthesia Preprocedure Evaluation (Addendum)
 Anesthesia Evaluation  Patient identified by MRN, date of birth, ID band Patient awake    Reviewed: Allergy & Precautions, Patient's Chart, lab work & pertinent test results  History of Anesthesia Complications Negative for: history of anesthetic complications  Airway Mallampati: II  TM Distance: >3 FB Neck ROM: Full    Dental no notable dental hx.    Pulmonary neg pulmonary ROS   Pulmonary exam normal        Cardiovascular negative cardio ROS Normal cardiovascular exam     Neuro/Psych negative neurological ROS     GI/Hepatic negative GI ROS, Neg liver ROS,,,  Endo/Other    Class 3 obesity  Renal/GU negative Renal ROS     Musculoskeletal negative musculoskeletal ROS (+)    Abdominal   Peds  Hematology negative hematology ROS (+)   Anesthesia Other Findings   Reproductive/Obstetrics (+) Pregnancy                              Anesthesia Physical Anesthesia Plan  ASA: 3  Anesthesia Plan: Epidural   Post-op Pain Management:    Induction:   PONV Risk Score and Plan: Treatment may vary due to age or medical condition  Airway Management Planned: Natural Airway  Additional Equipment: Fetal Monitoring  Intra-op Plan:   Post-operative Plan:   Informed Consent: I have reviewed the patients History and Physical, chart, labs and discussed the procedure including the risks, benefits and alternatives for the proposed anesthesia with the patient or authorized representative who has indicated his/her understanding and acceptance.     Interpreter used for interview  Plan Discussed with:   Anesthesia Plan Comments:          Anesthesia Quick Evaluation

## 2024-05-11 NOTE — H&P (Signed)
 Jametta Miaya Lafontant is a 38 y.o. [name] female at [redacted]w[redacted]d by u/s @ [redacted]w[redacted]d presenting with PROM @ 2345.   Reports decreased fetal movement since 0700 12/10, contractions: irreg/mild, vaginal bleeding: none, membranes: ruptured, clear fluid.  Initiated prenatal care at [facility] in first trimester, and at [facility] at 19 wks.   Most recent u/s : [redacted]w[redacted]d, 3668g (est 39w 4202g), AFI 17cm, cephalic, ant placenta.   This pregnancy complicated by: # lang barrier # suspected LGA # AMA # grandmultip # GBS+  Prenatal History/Complications:  # term vag del x 7; one vag 28wk delivery  Past Medical History: Past Medical History:  Diagnosis Date   Anemia    History of preterm delivery, currently pregnant 10/08/2020    Past Surgical History: Past Surgical History:  Procedure Laterality Date   NO PAST SURGERIES      Obstetrical History: OB History     Gravida  10   Para  8   Term  7   Preterm  1   AB  1   Living  8      SAB  1   IAB  0   Ectopic  0   Multiple  1   Live Births  8           Social History: Social History   Socioeconomic History   Marital status: Single    Spouse name: Not on file   Number of children: Not on file   Years of education: Not on file   Highest education level: Not on file  Occupational History   Not on file  Tobacco Use   Smoking status: Never   Smokeless tobacco: Never  Vaping Use   Vaping status: Never Used  Substance and Sexual Activity   Alcohol use: No   Drug use: No   Sexual activity: Yes  Other Topics Concern   Not on file  Social History Narrative   ** Merged History Encounter **       Are you right handed or left handed? Right handed  Are you currently employed ? No What is your current occupation? NA Do you live at home alone? NO Who lives with you?  Lives with family  What type of home do you live in: 1 story or 2 story? 1 stor   y      Social Drivers of Health   Tobacco Use: Low Risk (05/11/2024)   Patient  History    Smoking Tobacco Use: Never    Smokeless Tobacco Use: Never    Passive Exposure: Not on file  Financial Resource Strain: Not on file  Food Insecurity: Not on file  Transportation Needs: Not on file  Physical Activity: Not on file  Stress: Not on file  Social Connections: Not on file  Depression (EYV7-0): Not on file  Alcohol Screen: Not on file  Housing: Not on file  Utilities: Not on file  Health Literacy: Not on file    Family History: Family History  Problem Relation Age of Onset   Diabetes Mother    Hypertension Mother    Healthy Father    Alcohol abuse Neg Hx    Asthma Neg Hx    Arthritis Neg Hx    Birth defects Neg Hx    Cancer Neg Hx    COPD Neg Hx    Depression Neg Hx    Drug abuse Neg Hx    Early death Neg Hx    Hearing loss Neg Hx  Heart disease Neg Hx    Hyperlipidemia Neg Hx    Kidney disease Neg Hx    Learning disabilities Neg Hx    Mental illness Neg Hx    Mental retardation Neg Hx    Miscarriages / Stillbirths Neg Hx    Stroke Neg Hx    Vision loss Neg Hx     Allergies: Allergies[1]  Medications Prior to Admission  Medication Sig Dispense Refill Last Dose/Taking   aspirin  EC 81 MG tablet Take 1 tablet (81 mg total) by mouth daily. Swallow whole. 60 tablet 1 05/11/2024 Morning   Prenatal Vit-Fe Fumarate-FA (PRENATAL VITAMINS) 27-0.8 MG TABS Take 1 capsule by mouth daily. 30 each 0 05/11/2024 Morning   acetaminophen  (TYLENOL ) 500 MG tablet Take 1 tablet (500 mg total) by mouth every 6 (six) hours as needed. (Patient not taking: Reported on 04/06/2024) 30 tablet 0    acetaminophen -caffeine  (EXCEDRIN TENSION HEADACHE) 500-65 MG TABS per tablet Take 2 tablets by mouth 4 (four) times daily as needed (Foe headaches). (Patient not taking: Reported on 04/06/2024) 60 tablet 0     Review of Systems  Pertinent pos/neg as indicated in HPI  Blood pressure (!) 144/91, pulse (!) 105, temperature 98.2 F (36.8 C), temperature source Oral, resp. rate  17, last menstrual period 09/03/2023, unknown if currently breastfeeding. General appearance: breathing w ctx Lungs: clear to auscultation bilaterally Heart: regular rate and rhythm Abdomen: gravid, soft, non-tender, EFW by Leopold's approximately 8lbs Extremities: tr edema  Fetal monitoring: FHR: 140s bpm, variability: currently min, was moderate initially,  Accelerations: initially with 15x15 accels,  decelerations:  Absent Uterine activity: Frequency: Every 4-8 minutes Dilation: 2 Effacement (%): 50 Station: -2 Exam by:: NICOLE O'DONNELL, RN Presentation: cephalic   Prenatal labs: ABO, Rh: --/--/O POS (06/20 1716) Antibody: NEG (06/20 1716) Rubella: 2.25 (06/20 1721) RPR: Non Reactive (09/25 0903)  HBsAg: NON REACTIVE (06/20 1721)  HIV: Non Reactive (09/25 0903)  GBS: Positive/-- (11/24 1509)  2hr GTT: 80/150/116  Prenatal Transfer Tool  Maternal Diabetes: No Genetic Screening: Normal Maternal Ultrasounds/Referrals: Normal Fetal Ultrasounds or other Referrals:  None Maternal Substance Abuse:  No Significant Maternal Medications:  None Significant Maternal Lab Results: Group B Strep positive  Results for orders placed or performed during the hospital encounter of 05/11/24 (from the past 24 hours)  POCT fern test   Collection Time: 05/11/24 12:43 AM  Result Value Ref Range   POCT Fern Test Positive = ruptured amniotic membanes      Assessment:  [redacted]w[redacted]d [facility]  [name]  Grandmultip  PROM @ 2345  Suspected LGA  Cat 1 FHR  GBS Positive/-- (11/24 1509)  Plan:  Admit to L&D  IV pain meds/epidural prn active labor  PCN for GBS ppx  Anticipate vag delivery   Plans to breast and bottlefeed  Contraception: Nex v vas   Suzen JONETTA Gentry CNM 05/11/2024, 1:45 AM      [1] No Known Allergies

## 2024-05-11 NOTE — Anesthesia Procedure Notes (Signed)
 Epidural Patient location during procedure: OB Start time: 05/11/2024 2:52 AM End time: 05/11/2024 2:55 AM  Staffing Anesthesiologist: Paul Lamarr BRAVO, MD Performed: anesthesiologist   Preanesthetic Checklist Completed: patient identified, IV checked, risks and benefits discussed, monitors and equipment checked, pre-op evaluation and timeout performed  Epidural Patient position: sitting Prep: DuraPrep and site prepped and draped Patient monitoring: continuous pulse ox, blood pressure and heart rate Approach: midline Location: L3-L4 Injection technique: LOR air  Needle:  Needle type: Tuohy  Needle gauge: 17 G Needle length: 9 cm Needle insertion depth: 5 cm Catheter type: closed end flexible Catheter size: 19 Gauge Catheter at skin depth: 10 cm Test dose: negative and Other (1% lidocaine )  Assessment Events: blood not aspirated, no cerebrospinal fluid, injection not painful, no injection resistance, no paresthesia and negative IV test  Additional Notes Patient identified. Risks, benefits, and alternatives discussed with patient including but not limited to bleeding, infection, nerve damage, paralysis, failed block, incomplete pain control, headache, blood pressure changes, nausea, vomiting, reactions to medication, itching, and postpartum back pain. Confirmed with bedside nurse the patient's most recent platelet count. Confirmed with patient that they are not currently taking any anticoagulation, have any bleeding history, or any family history of bleeding disorders. Patient expressed understanding and wished to proceed. All questions were answered. Sterile technique was used throughout the entire procedure. Please see nursing notes for vital signs.   Crisp LOR on first pass. Test dose was given through epidural catheter and negative prior to continuing to dose epidural or start infusion. Warning signs of high block given to the patient including shortness of breath,  tingling/numbness in hands, complete motor block, or any concerning symptoms with instructions to call for help. Patient was given instructions on fall risk and not to get out of bed. All questions and concerns addressed with instructions to call with any issues or inadequate analgesia.  Reason for block:procedure for pain

## 2024-05-11 NOTE — Anesthesia Postprocedure Evaluation (Signed)
 Anesthesia Post Note  Patient: Mikeila Burgen  Procedure(s) Performed: AN AD HOC LABOR EPIDURAL     Patient location during evaluation: Mother Baby Anesthesia Type: Epidural Level of consciousness: awake and alert Pain management: pain level controlled Vital Signs Assessment: post-procedure vital signs reviewed and stable Respiratory status: spontaneous breathing, nonlabored ventilation and respiratory function stable Cardiovascular status: stable Postop Assessment: no headache, no backache and epidural receding Anesthetic complications: no   No notable events documented.  Last Vitals:  Vitals:   05/11/24 1123 05/11/24 1230  BP: 120/75 123/80  Pulse: 93 88  Resp: 20 20  Temp:  37.1 C  SpO2: 99%     Last Pain:  Vitals:   05/11/24 1230  TempSrc: Oral  PainSc:    Pain Goal:                   Cheryal Salas

## 2024-05-11 NOTE — Lactation Note (Signed)
 This note was copied from a baby's chart. Lactation Consultation Note  Patient Name: Shannon Myers Date: 05/11/2024 Age:38 hours Reason for consult: Initial assessment;Early term 37-38.6wks  P9. Used House Spanish Interpreter Susanna for consult. Mom is BF/formula feeding. Experienced BF mom only BF for 3-4 months doing both. Mom has inverted Rt. Nipple that everts well for latching. Attempted to latch baby but she was full from formula. Encouraged to latch to breast first before giving formula. Mom stated it hurts when she BF. LC noted when abby was crying has limited movement of tongue. It curls on sides and tongue is heart shaped. Suggested mom get peds. To look at it. Newborn feeding habits, STS, I&O, positioning, body alignment, support reviewed. Mom encouraged to feed baby 8-12 times/24 hours and with feeding cues.  Got baby to open but she wouldn't suckle on the breast. Mom stated she doesn't think the baby likes her big nipples. Encouraged mom to call for assistance as needed. Mom has electric pump but doesn't know the name brand. Encouraged mom to call for assistance as needed. Maternal Data Has patient been taught Hand Expression?: Yes Does the patient have breastfeeding experience prior to this delivery?: Yes How long did the patient breastfeed?: 3-4 months each. done BF/formula feeding then changes to formula. mom's youngest is 2 yrs old  Feeding Nipple Type: Extra Slow Flow  LATCH Score       Type of Nipple: Inverted (inverted Rt. nipple everts w/stimulation easily)            Lactation Tools Discussed/Used    Interventions Interventions: Breast feeding basics reviewed;Hand express;Education;LC Services brochure  Discharge Pump: DEBP  Consult Status Consult Status: Follow-up Date: 05/12/24 Follow-up type: In-patient    [name] G 05/11/2024, 10:43 PM

## 2024-05-11 NOTE — Discharge Summary (Signed)
 Postpartum Discharge Summary  Date of Service updated***     Patient Name: Shannon Myers DOB: [birth year] MRN: [id number]  Date of admission: 05/11/2024 Delivery date:05/11/2024 Delivering provider: BARBRA LANG PARAS Date of discharge: 05/11/2024  Admitting diagnosis: Normal labor [O80, Z37.9] Intrauterine pregnancy: [redacted]w[redacted]d     Secondary diagnosis:  Principal Problem:   Normal labor Active Problems:   Language barrier   Grand multiparity with current pregnancy in third trimester   AMA (advanced maternal age) multigravida 35+, third trimester   LGA (large for gestational age) fetus affecting mother, antepartum  Additional problems: NA    Discharge diagnosis: Term Pregnancy Delivered                                              Post partum procedures:NA Augmentation: Pitocin  Complications: None  Hospital course: Onset of Labor With Vaginal Delivery      38 y.o. yo [name] at [redacted]w[redacted]d was admitted in Latent Labor on 05/11/2024. Labor course was complicated by NA Membrane Rupture Time/Date: 11:45 PM,05/10/2024  Delivery Method:Vaginal, Spontaneous Operative Delivery:N/A Episiotomy: None Lacerations:  None Patient had a postpartum course complicated by ***.  She is ambulating, tolerating a regular diet, passing flatus, and urinating well. Patient is discharged home in stable condition on 05/11/2024.  Newborn Data: Birth date:05/11/2024 Birth time:9:29 AM Gender:Female Living status:Living Apgars:8 ,9  Weight:3400 g  Magnesium Sulfate received: No BMZ received: No Rhophylac:N/A MMR:N/A T-DaP:Given prenatally Flu: No RSV Vaccine received: No Transfusion:{Transfusion received:30440034}  Immunizations received: Immunization History  Administered Date(s) Administered   Influenza Split 04/05/2014   Influenza,inj,Quad PF,6+ Mos 03/09/2013, 07/13/2016, 02/25/2021   Tdap 01/05/2013, 09/10/2014, 11/11/2016, 02/25/2021, 04/20/2024    Physical exam  Vitals:    05/11/24 1001 05/11/24 1016 05/11/24 1032 05/11/24 1046  BP: 114/65 113/74 101/63 128/74  Pulse: 99 92 90 99  Resp:      Temp:      TempSrc:      SpO2:      Weight:      Height:       General: {Exam; general:21111117} Lochia: {Desc; appropriate/inappropriate:30686::appropriate} Uterine Fundus: {Desc; firm/soft:30687} Incision: {Exam; incision:21111123} DVT Evaluation: {Exam; dvt:2111122} Labs: Lab Results  Component Value Date   WBC 9.9 05/11/2024   HGB 12.5 05/11/2024   HCT 37.6 05/11/2024   MCV 83.4 05/11/2024   PLT 216 05/11/2024      Latest Ref Rng & Units 11/19/2023    5:21 PM  CMP  Glucose 70 - 99 mg/dL 90   BUN 6 - 20 mg/dL <5   Creatinine 9.55 - 1.00 mg/dL 9.46   Sodium 864 - 854 mmol/L 138   Potassium 3.5 - 5.1 mmol/L 3.6   Chloride 98 - 111 mmol/L 108   CO2 22 - 32 mmol/L 20   Calcium  8.9 - 10.3 mg/dL 8.6   Total Protein 6.5 - 8.1 g/dL 7.1   Total Bilirubin 0.0 - 1.2 mg/dL 1.3   Alkaline Phos 38 - 126 U/L 37   AST 15 - 41 U/L 32   ALT 0 - 44 U/L 34    Edinburgh Score:    06/06/2021   11:24 AM  Edinburgh Postnatal Depression Scale Screening Tool  I have been able to laugh and see the funny side of things. 0   I have looked forward with enjoyment to things. 0   I have  blamed myself unnecessarily when things went wrong. 0   I have been anxious or worried for no good reason. 0   I have felt scared or panicky for no good reason. 0   Things have been getting on top of me. 0   I have been so unhappy that I have had difficulty sleeping. 0   I have felt sad or miserable. 0   I have been so unhappy that I have been crying. 0   The thought of harming myself has occurred to me. 0   Edinburgh Postnatal Depression Scale Total 0      Data saved with a previous flowsheet row definition   No data recorded  After visit meds:  Allergies as of 05/11/2024   No Known Allergies   Med Rec must be completed prior to using this [facility]***        Discharge  home in stable condition Infant Feeding: Bottle and Breast Infant Disposition:home with mother Discharge instruction: per After Visit Summary and Postpartum booklet. Activity: Advance as tolerated. Pelvic rest for 6 weeks.  Diet: routine diet Future Appointments: Future Appointments  Date Time Provider Department Center  06/16/2024  9:15 AM Davis, Devon E, PA-C [facility]   Follow up Visit:  Msg sent to scheduling pool on 12/11:  Please schedule this patient for a In person postpartum visit in 6 weeks with the following provider: Any provider. Additional Postpartum F/U:NA  High risk pregnancy complicated by: maternal obesity, AMA, grand multiparity Delivery mode:  Vaginal, Spontaneous Anticipated Birth Control:  nexplanon  vs vas   05/11/2024 Paralee JONELLE Carpen, MD

## 2024-05-11 NOTE — MAU Note (Signed)
 Shannon Myers is a 38 y.o. at [redacted]w[redacted]d here in MAU reporting:   Pt states ROM at 1145pm. Clear fluid. No bleeding. Was told that baby was measuring big and that they were going to decide wether she was going to be a C/s or vaginal delivery.   GBS pos.  Desires epidural.  Has not felt baby move since 7am this morning.  Pain score: 10/10 pressure  Vitals:   05/11/24 0035  BP: (!) 144/91  Pulse: (!) 105  Resp: 17  Temp: 98.2 F (36.8 C)     FHT: 147 Lab orders placed from triage: labor eval.

## 2024-05-11 NOTE — Progress Notes (Signed)
 Patient ID: Shannon Myers, female   DOB: 04/16/1986, 38 y.o.   MRN: [id number]   Visit w [facility] Spanish interpreter; mostly comfortable w epidural although having some low abd discomfort intermittently; ready for 2nd dose of PCN to be hung  BPs during ctx were 140/90s in a 1.5h window; now 134/78 FHR 120s, +accels, no decels Ctx q 2-4 mins Cx 7/C/vtx 0  IUP@38 .1 Grandmultip SOL/SROM in active labor Suspected LGA  -Give 2nd PCN dose -Anticipate delivery needs related to potential for PPH and SD; will order TXA for pushing -PCA button for comfort  Shannon Myers [facility] 05/11/2024 6:57 AM

## 2024-05-12 ENCOUNTER — Other Ambulatory Visit (HOSPITAL_COMMUNITY): Payer: Self-pay

## 2024-05-12 LAB — TYPE AND SCREEN
ABO/RH(D): O POS
Antibody Screen: NEGATIVE

## 2024-05-12 MED ORDER — SIMETHICONE 80 MG PO CHEW
80.0000 mg | CHEWABLE_TABLET | ORAL | 0 refills | Status: AC | PRN
  Filled 2024-05-12: qty 30, 30d supply, fill #0

## 2024-05-12 MED ORDER — PRENATAL MULTIVITAMIN CH
1.0000 | ORAL_TABLET | Freq: Every day | ORAL | 4 refills | Status: AC
  Filled 2024-05-12: qty 30, 30d supply, fill #0

## 2024-05-12 MED ORDER — SENNOSIDES-DOCUSATE SODIUM 8.6-50 MG PO TABS
2.0000 | ORAL_TABLET | Freq: Two times a day (BID) | ORAL | 0 refills | Status: AC | PRN
  Filled 2024-05-12: qty 30, 8d supply, fill #0

## 2024-05-12 MED ORDER — IBUPROFEN 600 MG PO TABS
600.0000 mg | ORAL_TABLET | Freq: Four times a day (QID) | ORAL | 0 refills | Status: AC
  Filled 2024-05-12: qty 30, 8d supply, fill #0

## 2024-05-12 NOTE — Patient Instructions (Signed)

## 2024-05-12 NOTE — Lactation Note (Signed)
 This note was copied from a baby's chart. Lactation Consultation Note  Patient Name: Girl Natalie Mceuen Unijb'd Date: 05/12/2024 Age:38 hours Reason for consult: Follow-up assessment;Early term 25-38.6wks  Spanish interpreter used via video Leita. P9, Baby is breastfeeding and formula feeding. Encouraged offering the breast before formula to help establish mother's milk supply.  Reviewed engorgement care and monitoring voids/stools. Mother denies questions or concerns at this time. Maternal Data How long did the patient breastfeed?: 6-7 months  Feeding Mother's Current Feeding Choice: Breast Milk and Formula Nipple Type: Slow - flow  Interventions Interventions: Education  Discharge Discharge Education: Engorgement and breast care;Warning signs for feeding baby  Consult Status Consult Status: Complete Date: 05/12/24   Shannon Dines Boschen  RN, IBCLC 05/12/2024, 12:26 PM

## 2024-05-16 ENCOUNTER — Ambulatory Visit: Payer: Self-pay

## 2024-05-18 ENCOUNTER — Encounter: Payer: Self-pay | Admitting: Student

## 2024-05-20 ENCOUNTER — Telehealth (HOSPITAL_COMMUNITY): Payer: Self-pay

## 2024-05-20 NOTE — Telephone Encounter (Signed)
 05/20/2024 1130  Name: Shannon Myers MRN: [id number] DOB: [birth year]  Reason for Call:  Transition of Care Hospital Discharge Call  Contact Status: Patient Contact Status: Message  Language assistant needed: Interpreter Mode: Telephonic Interpreter Interpreter Name: [phone] Interpreter Phone Number - If applicable: [phone]        Follow-Up Questions:    Edinburgh Postnatal Depression Scale:  In the Past 7 Days:    PHQ2-9 Depression Scale:     Discharge Follow-up:    Post-discharge interventions: NA  Signature  Rosaline Deretha PEAK

## 2024-05-23 ENCOUNTER — Encounter: Payer: Self-pay | Admitting: Student

## 2024-06-16 ENCOUNTER — Encounter: Payer: Self-pay | Admitting: Medical

## 2024-06-16 ENCOUNTER — Other Ambulatory Visit: Payer: Self-pay

## 2024-06-16 ENCOUNTER — Ambulatory Visit: Payer: Self-pay | Admitting: Medical

## 2024-06-16 DIAGNOSIS — Z30017 Encounter for initial prescription of implantable subdermal contraceptive: Secondary | ICD-10-CM

## 2024-06-16 DIAGNOSIS — Z7689 Persons encountering health services in other specified circumstances: Secondary | ICD-10-CM

## 2024-06-16 LAB — POCT PREGNANCY, URINE: Preg Test, Ur: NEGATIVE

## 2024-06-16 MED ORDER — ETONOGESTREL 68 MG ~~LOC~~ IMPL
68.0000 mg | DRUG_IMPLANT | Freq: Once | SUBCUTANEOUS | Status: AC
  Administered 2024-06-16: 68 mg via SUBCUTANEOUS

## 2024-06-16 NOTE — Progress Notes (Signed)
 "   Post Partum Visit Note  Shannon Myers is a 39 y.o. [id number] female who presents for a postpartum visit. She is 5.1 weeks postpartum following a normal spontaneous vaginal delivery.  I have fully reviewed the prenatal and intrapartum course. The delivery was at 38.1 gestational weeks.  Anesthesia: epidural. Postpartum course has been uncomplicated. Baby is doing well. Baby is feeding by both breast and bottle - Similac Advance. Bleeding no bleeding. Bowel function is normal. Bladder function is normal. Patient is not sexually active. Contraception method is abstinence. Postpartum depression screening: negative.   Upstream - 06/16/24 1018       Pregnancy Intention Screening   Does the patient want to become pregnant in the next year? No    Does the patient's partner want to become pregnant in the next year? Unsure    Would the patient like to discuss contraceptive options today? Yes      Contraception Wrap Up   Current Method Abstinence    End Method Hormonal Implant    Contraception Counseling Provided Yes    How was the end contraceptive method provided? Provided on site         The pregnancy intention screening data noted above was reviewed. Potential methods of contraception were discussed. The patient elected to proceed with Hormonal Implant.   Edinburgh Postnatal Depression Scale - 06/16/24 0956       Edinburgh Postnatal Depression Scale:  In the Past 7 Days   I have been able to laugh and see the funny side of things. 0    I have looked forward with enjoyment to things. 0    I have blamed myself unnecessarily when things went wrong. 0    I have been anxious or worried for no good reason. 0    I have felt scared or panicky for no good reason. 0    Things have been getting on top of me. 0    I have been so unhappy that I have had difficulty sleeping. 0    I have felt sad or miserable. 0    I have been so unhappy that I have been crying. 0    The thought of harming  myself has occurred to me. 0    Edinburgh Postnatal Depression Scale Total 0          Health Maintenance Due  Topic Date Due   Hepatitis B Vaccines 19-59 Average Risk (1 of 3 - 19+ 3-dose series) Never done   Influenza Vaccine  12/31/2023   COVID-19 Vaccine (1 - 2025-26 season) Never done    The following portions of the patient's history were reviewed and updated as appropriate: allergies, current medications, past family history, past medical history, past social history, past surgical history, and problem list.  Review of Systems Pertinent items are noted in HPI.  Objective:  BP 129/80   Pulse 72   Ht 4' 11 (1.499 m)   Wt 215 lb 8 oz (97.8 kg)   LMP 09/03/2023   Breastfeeding Yes Comment: Bottle as well  BMI 43.53 kg/m    General:  alert and cooperative   Breasts:  not indicated  Lungs: clear to auscultation bilaterally  Heart:  regular rate and rhythm, S1, S2 normal, no murmur, click, rub or gallop  Abdomen: soft, non-tender; bowel sounds normal; no masses,  no organomegaly   Wound N/A  GU exam:  not indicated          Nexplanon  Insertion Procedure Patient  was given informed consent, she signed consent form.  Patient does understand that irregular bleeding is a very common side effect of this medication. She was advised to have backup contraception for one week after placement. Pregnancy test in clinic today was negative.  Appropriate time out taken.  Patient's left arm was prepped and marked for insertion area.  Patient was prepped with alcohol swab and then injected with 3 ml of 1% lidocaine .  She was prepped with betadine, Nexplanon  removed from packaging,  Device confirmed in needle, then inserted full length of needle and withdrawn per handbook instructions. Nexplanon  was able to palpated in the patient's arm; patient palpated the insert herself. There was minimal blood loss.  Patient insertion site covered with guaze and a pressure bandage to reduce any bruising.   The patient tolerated the procedure well and was given post procedure instructions.     Assessment:   Encounter Diagnoses  Name Primary?   Routine postpartum follow-up Yes   Nexplanon  insertion      Plan:   Essential components of care per ACOG recommendations:  1.  Mood and well being: Patient with negative depression screening today. Reviewed local resources for support.  - Patient tobacco use? No.   - hx of drug use? No.    2. Infant care and feeding:  -Patient currently breastmilk feeding? Yes. Reviewed importance of draining breast regularly to support lactation.  -Social determinants of health (SDOH) reviewed in EPIC. No concerns  3. Sexuality, contraception and birth spacing - Patient does not want a pregnancy in the next year.  Desired family size is 9 children.  - Reviewed reproductive life planning. Reviewed contraceptive methods based on pt preferences and effectiveness.  Patient desired Hormonal Implant today.    4. Sleep and fatigue -Encouraged family/partner/community support of 4 hrs of uninterrupted sleep to help with mood and fatigue  5. Physical Recovery  - Discussed patients delivery and complications. She describes her labor as good. - Patient had a Vaginal, no problems at delivery. Patient had no laceration. Perineal healing reviewed. Patient expressed understanding - Patient has urinary incontinence? No. - Patient is safe to resume physical and sexual activity  6.  Health Maintenance - HM due items addressed Yes - Last pap smear  Diagnosis  Date Value Ref Range Status  09/02/2020   Final   - Negative for intraepithelial lesion or malignancy (NILM)   Pap smear not done at today's visit.  -Breast Cancer screening indicated? No.   7. Chronic Disease/Pregnancy Condition follow up: None - PCP follow up  Mliss Rinne, PA-C [facility]  "

## 2024-06-16 NOTE — Progress Notes (Signed)
 Pt wants Nexplanon
